# Patient Record
Sex: Female | Born: 1935 | Race: Black or African American | Hispanic: No | Marital: Married | Smoking: Former smoker
Health system: Southern US, Community
[De-identification: ages and names within clinical notes are randomized; demographics above are authoritative.]

## PROBLEM LIST (undated history)

## (undated) DIAGNOSIS — B853 Phthiriasis: Secondary | ICD-10-CM

## (undated) DIAGNOSIS — K859 Acute pancreatitis without necrosis or infection, unspecified: Secondary | ICD-10-CM

## (undated) DIAGNOSIS — M069 Rheumatoid arthritis, unspecified: Secondary | ICD-10-CM

## (undated) DIAGNOSIS — G629 Polyneuropathy, unspecified: Secondary | ICD-10-CM

## (undated) DIAGNOSIS — M8580 Other specified disorders of bone density and structure, unspecified site: Secondary | ICD-10-CM

## (undated) DIAGNOSIS — R634 Abnormal weight loss: Secondary | ICD-10-CM

## (undated) DIAGNOSIS — K219 Gastro-esophageal reflux disease without esophagitis: Secondary | ICD-10-CM

## (undated) DIAGNOSIS — N289 Disorder of kidney and ureter, unspecified: Secondary | ICD-10-CM

## (undated) DIAGNOSIS — J449 Chronic obstructive pulmonary disease, unspecified: Secondary | ICD-10-CM

## (undated) DIAGNOSIS — I10 Essential (primary) hypertension: Secondary | ICD-10-CM

## (undated) DIAGNOSIS — Z9981 Dependence on supplemental oxygen: Secondary | ICD-10-CM

## (undated) DIAGNOSIS — R42 Dizziness and giddiness: Secondary | ICD-10-CM

## (undated) DIAGNOSIS — N200 Calculus of kidney: Secondary | ICD-10-CM

## (undated) DIAGNOSIS — G4700 Insomnia, unspecified: Secondary | ICD-10-CM

## (undated) DIAGNOSIS — R1310 Dysphagia, unspecified: Secondary | ICD-10-CM

## (undated) DIAGNOSIS — L409 Psoriasis, unspecified: Secondary | ICD-10-CM

## (undated) DIAGNOSIS — J849 Interstitial pulmonary disease, unspecified: Secondary | ICD-10-CM

## (undated) DIAGNOSIS — M1990 Unspecified osteoarthritis, unspecified site: Secondary | ICD-10-CM

## (undated) HISTORY — DX: Phthiriasis: B85.3

## (undated) HISTORY — DX: Interstitial pulmonary disease, unspecified: J84.9

## (undated) HISTORY — DX: Rheumatoid arthritis, unspecified: M06.9

## (undated) HISTORY — DX: Dizziness and giddiness: R42

## (undated) HISTORY — DX: Essential (primary) hypertension: I10

## (undated) HISTORY — PX: CHOLECYSTECTOMY: SHX55

## (undated) HISTORY — DX: Other specified disorders of bone density and structure, unspecified site: M85.80

---

## 1960-08-01 HISTORY — PX: VESICOVAGINAL FISTULA CLOSURE W/ TAH: SUR271

## 1998-01-07 ENCOUNTER — Other Ambulatory Visit: Admission: RE | Admit: 1998-01-07 | Discharge: 1998-01-07 | Payer: Self-pay | Admitting: Family Medicine

## 1998-01-08 ENCOUNTER — Ambulatory Visit (HOSPITAL_COMMUNITY): Admission: RE | Admit: 1998-01-08 | Discharge: 1998-01-08 | Payer: Self-pay | Admitting: Family Medicine

## 1999-01-21 ENCOUNTER — Emergency Department (HOSPITAL_COMMUNITY): Admission: EM | Admit: 1999-01-21 | Discharge: 1999-01-21 | Payer: Self-pay | Admitting: Emergency Medicine

## 1999-06-17 ENCOUNTER — Encounter: Payer: Self-pay | Admitting: Emergency Medicine

## 1999-06-17 ENCOUNTER — Emergency Department (HOSPITAL_COMMUNITY): Admission: EM | Admit: 1999-06-17 | Discharge: 1999-06-17 | Payer: Self-pay | Admitting: Emergency Medicine

## 2001-12-02 ENCOUNTER — Encounter: Payer: Self-pay | Admitting: Emergency Medicine

## 2001-12-02 ENCOUNTER — Inpatient Hospital Stay (HOSPITAL_COMMUNITY): Admission: EM | Admit: 2001-12-02 | Discharge: 2001-12-04 | Payer: Self-pay | Admitting: Emergency Medicine

## 2002-05-08 ENCOUNTER — Ambulatory Visit: Admission: RE | Admit: 2002-05-08 | Discharge: 2002-05-08 | Payer: Self-pay | Admitting: Pulmonary Disease

## 2002-07-15 ENCOUNTER — Ambulatory Visit (HOSPITAL_COMMUNITY): Admission: RE | Admit: 2002-07-15 | Discharge: 2002-07-15 | Payer: Self-pay | Admitting: Family Medicine

## 2002-07-23 ENCOUNTER — Ambulatory Visit (HOSPITAL_COMMUNITY): Admission: RE | Admit: 2002-07-23 | Discharge: 2002-07-23 | Payer: Self-pay | Admitting: Family Medicine

## 2002-11-27 ENCOUNTER — Encounter: Admission: RE | Admit: 2002-11-27 | Discharge: 2003-01-08 | Payer: Self-pay | Admitting: Family Medicine

## 2003-04-17 ENCOUNTER — Ambulatory Visit (HOSPITAL_COMMUNITY): Admission: RE | Admit: 2003-04-17 | Discharge: 2003-04-17 | Payer: Self-pay | Admitting: Family Medicine

## 2003-04-17 ENCOUNTER — Encounter: Payer: Self-pay | Admitting: Family Medicine

## 2003-08-29 ENCOUNTER — Encounter: Admission: RE | Admit: 2003-08-29 | Discharge: 2003-08-29 | Payer: Self-pay | Admitting: Family Medicine

## 2004-04-16 ENCOUNTER — Inpatient Hospital Stay (HOSPITAL_COMMUNITY): Admission: EM | Admit: 2004-04-16 | Discharge: 2004-04-18 | Payer: Self-pay | Admitting: Emergency Medicine

## 2004-04-19 ENCOUNTER — Inpatient Hospital Stay (HOSPITAL_COMMUNITY): Admission: EM | Admit: 2004-04-19 | Discharge: 2004-04-22 | Payer: Self-pay | Admitting: Emergency Medicine

## 2004-04-22 ENCOUNTER — Inpatient Hospital Stay: Admission: RE | Admit: 2004-04-22 | Discharge: 2004-04-25 | Payer: Self-pay | Admitting: Emergency Medicine

## 2004-04-22 ENCOUNTER — Ambulatory Visit: Payer: Self-pay | Admitting: Physical Medicine & Rehabilitation

## 2004-05-12 ENCOUNTER — Ambulatory Visit: Payer: Self-pay | Admitting: *Deleted

## 2004-05-13 ENCOUNTER — Ambulatory Visit: Payer: Self-pay | Admitting: Family Medicine

## 2004-06-17 ENCOUNTER — Ambulatory Visit: Payer: Self-pay | Admitting: Pulmonary Disease

## 2004-08-25 ENCOUNTER — Ambulatory Visit: Payer: Self-pay | Admitting: Pulmonary Disease

## 2004-09-16 ENCOUNTER — Encounter: Admission: RE | Admit: 2004-09-16 | Discharge: 2004-09-16 | Payer: Self-pay | Admitting: Family Medicine

## 2004-09-22 ENCOUNTER — Ambulatory Visit: Payer: Self-pay | Admitting: Pulmonary Disease

## 2004-09-23 ENCOUNTER — Ambulatory Visit: Payer: Self-pay | Admitting: Family Medicine

## 2004-10-05 ENCOUNTER — Ambulatory Visit: Payer: Self-pay | Admitting: Internal Medicine

## 2004-10-11 ENCOUNTER — Encounter: Admission: RE | Admit: 2004-10-11 | Discharge: 2005-01-09 | Payer: Self-pay | Admitting: Family Medicine

## 2004-11-17 ENCOUNTER — Ambulatory Visit: Payer: Self-pay | Admitting: Internal Medicine

## 2004-12-20 ENCOUNTER — Ambulatory Visit: Payer: Self-pay | Admitting: Pulmonary Disease

## 2005-01-19 ENCOUNTER — Ambulatory Visit: Payer: Self-pay | Admitting: Internal Medicine

## 2005-02-14 ENCOUNTER — Ambulatory Visit: Payer: Self-pay | Admitting: Endocrinology

## 2005-02-15 ENCOUNTER — Ambulatory Visit (HOSPITAL_COMMUNITY): Admission: RE | Admit: 2005-02-15 | Discharge: 2005-02-15 | Payer: Self-pay | Admitting: Endocrinology

## 2005-02-16 ENCOUNTER — Ambulatory Visit: Payer: Self-pay | Admitting: Endocrinology

## 2005-02-16 ENCOUNTER — Ambulatory Visit: Payer: Self-pay | Admitting: Internal Medicine

## 2005-02-23 ENCOUNTER — Ambulatory Visit: Payer: Self-pay | Admitting: Pulmonary Disease

## 2005-03-01 ENCOUNTER — Ambulatory Visit: Payer: Self-pay | Admitting: Endocrinology

## 2005-04-18 ENCOUNTER — Ambulatory Visit: Admission: RE | Admit: 2005-04-18 | Discharge: 2005-04-18 | Payer: Self-pay | Admitting: Anesthesiology

## 2005-04-19 ENCOUNTER — Ambulatory Visit (HOSPITAL_COMMUNITY): Admission: RE | Admit: 2005-04-19 | Discharge: 2005-04-20 | Payer: Self-pay | Admitting: General Surgery

## 2005-04-19 ENCOUNTER — Ambulatory Visit: Payer: Self-pay | Admitting: Internal Medicine

## 2005-04-19 ENCOUNTER — Encounter (INDEPENDENT_AMBULATORY_CARE_PROVIDER_SITE_OTHER): Payer: Self-pay | Admitting: *Deleted

## 2005-05-11 ENCOUNTER — Ambulatory Visit: Payer: Self-pay | Admitting: Endocrinology

## 2005-05-16 ENCOUNTER — Ambulatory Visit: Payer: Self-pay | Admitting: Endocrinology

## 2005-05-18 ENCOUNTER — Ambulatory Visit: Payer: Self-pay | Admitting: Family Medicine

## 2005-05-23 ENCOUNTER — Emergency Department (HOSPITAL_COMMUNITY): Admission: EM | Admit: 2005-05-23 | Discharge: 2005-05-23 | Payer: Self-pay | Admitting: Emergency Medicine

## 2005-05-25 ENCOUNTER — Ambulatory Visit: Payer: Self-pay | Admitting: Endocrinology

## 2005-06-16 ENCOUNTER — Ambulatory Visit: Payer: Self-pay | Admitting: Endocrinology

## 2005-06-29 ENCOUNTER — Ambulatory Visit: Payer: Self-pay | Admitting: Family Medicine

## 2005-06-30 ENCOUNTER — Ambulatory Visit (HOSPITAL_COMMUNITY): Admission: RE | Admit: 2005-06-30 | Discharge: 2005-06-30 | Payer: Self-pay | Admitting: Internal Medicine

## 2005-07-06 ENCOUNTER — Ambulatory Visit: Payer: Self-pay | Admitting: Endocrinology

## 2005-07-11 ENCOUNTER — Ambulatory Visit: Payer: Self-pay | Admitting: Family Medicine

## 2005-07-28 ENCOUNTER — Ambulatory Visit: Payer: Self-pay | Admitting: Pulmonary Disease

## 2005-08-29 ENCOUNTER — Encounter: Admission: RE | Admit: 2005-08-29 | Discharge: 2005-08-29 | Payer: Self-pay | Admitting: Rheumatology

## 2005-09-06 ENCOUNTER — Ambulatory Visit: Payer: Self-pay | Admitting: Family Medicine

## 2005-09-15 ENCOUNTER — Ambulatory Visit: Payer: Self-pay | Admitting: Pulmonary Disease

## 2005-10-17 ENCOUNTER — Ambulatory Visit: Payer: Self-pay | Admitting: Pulmonary Disease

## 2005-10-18 ENCOUNTER — Ambulatory Visit: Payer: Self-pay | Admitting: Family Medicine

## 2005-10-26 ENCOUNTER — Ambulatory Visit (HOSPITAL_COMMUNITY): Admission: RE | Admit: 2005-10-26 | Discharge: 2005-10-26 | Payer: Self-pay | Admitting: Internal Medicine

## 2005-11-23 ENCOUNTER — Emergency Department (HOSPITAL_COMMUNITY): Admission: EM | Admit: 2005-11-23 | Discharge: 2005-11-23 | Payer: Self-pay | Admitting: Emergency Medicine

## 2005-11-30 ENCOUNTER — Ambulatory Visit: Payer: Self-pay | Admitting: Pulmonary Disease

## 2006-01-13 ENCOUNTER — Ambulatory Visit: Payer: Self-pay | Admitting: Family Medicine

## 2006-01-17 ENCOUNTER — Ambulatory Visit (HOSPITAL_COMMUNITY): Admission: RE | Admit: 2006-01-17 | Discharge: 2006-01-17 | Payer: Self-pay | Admitting: Family Medicine

## 2006-02-06 ENCOUNTER — Ambulatory Visit: Payer: Self-pay | Admitting: Pulmonary Disease

## 2006-02-28 ENCOUNTER — Ambulatory Visit: Payer: Self-pay | Admitting: Family Medicine

## 2006-03-25 ENCOUNTER — Encounter: Admission: RE | Admit: 2006-03-25 | Discharge: 2006-03-25 | Payer: Self-pay

## 2006-05-29 ENCOUNTER — Ambulatory Visit: Payer: Self-pay | Admitting: Family Medicine

## 2006-08-03 ENCOUNTER — Encounter: Admission: RE | Admit: 2006-08-03 | Discharge: 2006-08-03 | Payer: Self-pay

## 2006-09-07 ENCOUNTER — Encounter: Admission: RE | Admit: 2006-09-07 | Discharge: 2006-09-07 | Payer: Self-pay

## 2006-09-15 ENCOUNTER — Ambulatory Visit: Payer: Self-pay | Admitting: Pulmonary Disease

## 2006-09-21 ENCOUNTER — Ambulatory Visit: Payer: Self-pay | Admitting: Pulmonary Disease

## 2006-10-12 ENCOUNTER — Ambulatory Visit: Payer: Self-pay | Admitting: Family Medicine

## 2006-10-24 ENCOUNTER — Ambulatory Visit (HOSPITAL_COMMUNITY): Admission: RE | Admit: 2006-10-24 | Discharge: 2006-10-24 | Payer: Self-pay | Admitting: Family Medicine

## 2006-11-06 ENCOUNTER — Ambulatory Visit: Payer: Self-pay | Admitting: Pulmonary Disease

## 2006-12-05 ENCOUNTER — Ambulatory Visit (HOSPITAL_COMMUNITY): Admission: RE | Admit: 2006-12-05 | Discharge: 2006-12-05 | Payer: Self-pay | Admitting: Family Medicine

## 2007-01-02 ENCOUNTER — Encounter: Admission: RE | Admit: 2007-01-02 | Discharge: 2007-01-02 | Payer: Self-pay

## 2007-01-04 ENCOUNTER — Ambulatory Visit: Payer: Self-pay | Admitting: Internal Medicine

## 2007-02-26 DIAGNOSIS — I10 Essential (primary) hypertension: Secondary | ICD-10-CM | POA: Insufficient documentation

## 2007-02-26 DIAGNOSIS — M949 Disorder of cartilage, unspecified: Secondary | ICD-10-CM

## 2007-02-26 DIAGNOSIS — M899 Disorder of bone, unspecified: Secondary | ICD-10-CM | POA: Insufficient documentation

## 2007-03-13 ENCOUNTER — Ambulatory Visit: Payer: Self-pay | Admitting: Pulmonary Disease

## 2007-04-03 ENCOUNTER — Encounter: Admission: RE | Admit: 2007-04-03 | Discharge: 2007-04-30 | Payer: Self-pay

## 2007-05-01 ENCOUNTER — Encounter: Admission: RE | Admit: 2007-05-01 | Discharge: 2007-06-05 | Payer: Self-pay

## 2007-05-29 ENCOUNTER — Ambulatory Visit: Payer: Self-pay | Admitting: Pulmonary Disease

## 2007-06-11 ENCOUNTER — Ambulatory Visit: Payer: Self-pay | Admitting: Pulmonary Disease

## 2007-06-12 ENCOUNTER — Ambulatory Visit: Payer: Self-pay

## 2007-06-14 DIAGNOSIS — J8410 Pulmonary fibrosis, unspecified: Secondary | ICD-10-CM

## 2007-06-14 DIAGNOSIS — J209 Acute bronchitis, unspecified: Secondary | ICD-10-CM

## 2007-06-14 DIAGNOSIS — J9610 Chronic respiratory failure, unspecified whether with hypoxia or hypercapnia: Secondary | ICD-10-CM

## 2007-06-14 DIAGNOSIS — M0510 Rheumatoid lung disease with rheumatoid arthritis of unspecified site: Secondary | ICD-10-CM

## 2007-06-14 DIAGNOSIS — E678 Other specified hyperalimentation: Secondary | ICD-10-CM

## 2007-12-06 ENCOUNTER — Ambulatory Visit (HOSPITAL_COMMUNITY): Admission: RE | Admit: 2007-12-06 | Discharge: 2007-12-06 | Payer: Self-pay | Admitting: Internal Medicine

## 2008-01-16 ENCOUNTER — Ambulatory Visit: Payer: Self-pay | Admitting: Pulmonary Disease

## 2008-01-16 DIAGNOSIS — J449 Chronic obstructive pulmonary disease, unspecified: Secondary | ICD-10-CM

## 2008-04-25 ENCOUNTER — Ambulatory Visit: Payer: Self-pay | Admitting: Pulmonary Disease

## 2008-07-17 ENCOUNTER — Ambulatory Visit: Payer: Self-pay | Admitting: Pulmonary Disease

## 2008-07-17 LAB — CONVERTED CEMR LAB
BUN: 17 mg/dL (ref 6–23)
Chloride: 106 meq/L (ref 96–112)
Creatinine, Ser: 1.2 mg/dL (ref 0.4–1.2)
GFR calc Af Amer: 57 mL/min
GFR calc non Af Amer: 47 mL/min

## 2008-07-28 ENCOUNTER — Ambulatory Visit: Payer: Self-pay

## 2008-07-28 ENCOUNTER — Encounter: Payer: Self-pay | Admitting: Pulmonary Disease

## 2008-08-06 ENCOUNTER — Inpatient Hospital Stay (HOSPITAL_COMMUNITY): Admission: EM | Admit: 2008-08-06 | Discharge: 2008-08-12 | Payer: Self-pay | Admitting: Emergency Medicine

## 2008-08-06 ENCOUNTER — Ambulatory Visit: Payer: Self-pay | Admitting: Cardiology

## 2008-08-07 ENCOUNTER — Encounter (INDEPENDENT_AMBULATORY_CARE_PROVIDER_SITE_OTHER): Payer: Self-pay | Admitting: Internal Medicine

## 2008-08-08 ENCOUNTER — Telehealth (INDEPENDENT_AMBULATORY_CARE_PROVIDER_SITE_OTHER): Payer: Self-pay | Admitting: *Deleted

## 2008-08-26 ENCOUNTER — Encounter: Payer: Self-pay | Admitting: Pulmonary Disease

## 2008-10-09 ENCOUNTER — Ambulatory Visit: Payer: Self-pay | Admitting: Pulmonary Disease

## 2008-11-07 ENCOUNTER — Ambulatory Visit: Payer: Self-pay | Admitting: Family Medicine

## 2008-12-10 ENCOUNTER — Ambulatory Visit (HOSPITAL_COMMUNITY)
Admission: RE | Admit: 2008-12-10 | Discharge: 2008-12-10 | Payer: Self-pay | Admitting: Physical Medicine and Rehabilitation

## 2009-03-09 ENCOUNTER — Ambulatory Visit: Payer: Self-pay | Admitting: Pulmonary Disease

## 2009-03-12 ENCOUNTER — Ambulatory Visit (HOSPITAL_COMMUNITY): Admission: RE | Admit: 2009-03-12 | Discharge: 2009-03-12 | Payer: Self-pay | Admitting: Urology

## 2009-04-15 ENCOUNTER — Ambulatory Visit: Payer: Self-pay | Admitting: Internal Medicine

## 2009-04-27 ENCOUNTER — Ambulatory Visit: Payer: Self-pay | Admitting: Pulmonary Disease

## 2009-04-27 ENCOUNTER — Ambulatory Visit (HOSPITAL_COMMUNITY): Admission: RE | Admit: 2009-04-27 | Discharge: 2009-04-27 | Payer: Self-pay | Admitting: Family Medicine

## 2009-05-20 ENCOUNTER — Ambulatory Visit: Payer: Self-pay | Admitting: Pulmonary Disease

## 2009-09-17 ENCOUNTER — Ambulatory Visit: Payer: Self-pay | Admitting: Pulmonary Disease

## 2009-10-23 ENCOUNTER — Encounter: Payer: Self-pay | Admitting: Internal Medicine

## 2009-10-28 ENCOUNTER — Encounter: Admission: RE | Admit: 2009-10-28 | Discharge: 2009-10-28 | Payer: Self-pay | Admitting: Internal Medicine

## 2009-11-05 ENCOUNTER — Encounter: Admission: RE | Admit: 2009-11-05 | Discharge: 2009-11-05 | Payer: Self-pay | Admitting: Internal Medicine

## 2009-12-15 ENCOUNTER — Ambulatory Visit (HOSPITAL_COMMUNITY): Admission: RE | Admit: 2009-12-15 | Discharge: 2009-12-15 | Payer: Self-pay | Admitting: Internal Medicine

## 2010-03-04 ENCOUNTER — Ambulatory Visit (HOSPITAL_COMMUNITY): Admission: RE | Admit: 2010-03-04 | Discharge: 2010-03-04 | Payer: Self-pay | Admitting: Urology

## 2010-03-17 ENCOUNTER — Ambulatory Visit: Payer: Self-pay | Admitting: Pulmonary Disease

## 2010-05-26 ENCOUNTER — Encounter: Admission: RE | Admit: 2010-05-26 | Discharge: 2010-06-22 | Payer: Self-pay | Admitting: Rheumatology

## 2010-07-19 ENCOUNTER — Encounter: Admission: RE | Admit: 2010-07-19 | Payer: Self-pay | Source: Home / Self Care | Admitting: Rheumatology

## 2010-08-06 ENCOUNTER — Telehealth (INDEPENDENT_AMBULATORY_CARE_PROVIDER_SITE_OTHER): Payer: Self-pay | Admitting: *Deleted

## 2010-08-09 ENCOUNTER — Telehealth (INDEPENDENT_AMBULATORY_CARE_PROVIDER_SITE_OTHER): Payer: Self-pay | Admitting: *Deleted

## 2010-08-11 ENCOUNTER — Ambulatory Visit: Admit: 2010-08-11 | Payer: Self-pay | Admitting: Cardiology

## 2010-08-16 ENCOUNTER — Inpatient Hospital Stay (HOSPITAL_COMMUNITY)
Admission: EM | Admit: 2010-08-16 | Discharge: 2010-08-18 | Payer: Self-pay | Source: Home / Self Care | Attending: Internal Medicine | Admitting: Internal Medicine

## 2010-08-18 LAB — CBC
HCT: 30.7 % — ABNORMAL LOW (ref 36.0–46.0)
Hemoglobin: 9.5 g/dL — ABNORMAL LOW (ref 12.0–15.0)
MCH: 26 pg (ref 26.0–34.0)
MCHC: 30.9 g/dL (ref 30.0–36.0)
MCV: 83.9 fL (ref 78.0–100.0)
Platelets: 245 10*3/uL (ref 150–400)
RBC: 3.66 MIL/uL — ABNORMAL LOW (ref 3.87–5.11)
RDW: 14.2 % (ref 11.5–15.5)
WBC: 9.3 10*3/uL (ref 4.0–10.5)

## 2010-08-18 LAB — COMPREHENSIVE METABOLIC PANEL
ALT: <8 U/L (ref 0–35)
AST: 16 U/L (ref 0–37)
Albumin: 2.7 g/dL — ABNORMAL LOW (ref 3.5–5.2)
Alkaline Phosphatase: 41 U/L (ref 39–117)
BUN: 24 mg/dL — ABNORMAL HIGH (ref 6–23)
CO2: 25 mEq/L (ref 19–32)
Calcium: 8.9 mg/dL (ref 8.4–10.5)
Chloride: 112 mEq/L (ref 96–112)
Creatinine, Ser: 1.41 mg/dL — ABNORMAL HIGH (ref 0.4–1.2)
GFR calc Af Amer: 44 mL/min — ABNORMAL LOW (ref >60)
GFR calc non Af Amer: 37 mL/min — ABNORMAL LOW (ref >60)
Glucose, Bld: 123 mg/dL — ABNORMAL HIGH (ref 70–99)
Potassium: 3.9 mEq/L (ref 3.5–5.1)
Sodium: 143 mEq/L (ref 135–145)
Total Bilirubin: 0.3 mg/dL (ref 0.3–1.2)
Total Protein: 5.7 g/dL — ABNORMAL LOW (ref 6.0–8.3)

## 2010-08-18 LAB — DIFFERENTIAL
Basophils Absolute: 0 10*3/uL (ref 0.0–0.1)
Basophils Relative: 0 % (ref 0–1)
Eosinophils Absolute: 0.1 10*3/uL (ref 0.0–0.7)
Eosinophils Relative: 1 % (ref 0–5)
Lymphocytes Relative: 24 % (ref 12–46)
Lymphs Abs: 2.2 10*3/uL (ref 0.7–4.0)
Monocytes Absolute: 0.8 10*3/uL (ref 0.1–1.0)
Monocytes Relative: 9 % (ref 3–12)
Neutro Abs: 6.2 10*3/uL (ref 1.7–7.7)
Neutrophils Relative %: 66 % (ref 43–77)

## 2010-08-18 LAB — ABO/RH: ABO/RH(D): O POS

## 2010-08-18 LAB — APTT: aPTT: 28 seconds (ref 24–37)

## 2010-08-18 LAB — HEMOGLOBIN AND HEMATOCRIT, BLOOD
HCT: 26.9 % — ABNORMAL LOW (ref 36.0–46.0)
HCT: 30.2 % — ABNORMAL LOW (ref 36.0–46.0)
Hemoglobin: 8.3 g/dL — ABNORMAL LOW (ref 12.0–15.0)
Hemoglobin: 9.5 g/dL — ABNORMAL LOW (ref 12.0–15.0)

## 2010-08-18 LAB — PROTIME-INR
INR: 1 (ref 0.00–1.49)
Prothrombin Time: 13.4 seconds (ref 11.6–15.2)

## 2010-08-18 LAB — OCCULT BLOOD, POC DEVICE: Fecal Occult Bld: POSITIVE

## 2010-08-21 ENCOUNTER — Encounter: Payer: Self-pay | Admitting: Family Medicine

## 2010-08-21 NOTE — H&P (Addendum)
[name], [name]              ACCOUNT NO.:  [id number]  MEDICAL RECORD NO.:  [id number]          PATIENT TYPE:  INP  LOCATION:  1824                         FACILITY:  [facility]  PHYSICIAN:  [name], MD   DATE OF BIRTH:  [birth year]  DATE OF ADMISSION:  08/16/2010 DATE OF DISCHARGE:                             HISTORY & PHYSICAL   PRIMARY CARE PHYSICIAN:  [name], MD  GASTROENTEROLOGIST:  [name], MD  CHIEF COMPLAINT:  Rectal bleed.  HISTORY OF PRESENT ILLNESS:  This is a 76 year old woman who woke up this morning to use the bathroom and noticed bleeding per rectum.  She has had several episodes in the past 24 hours.  She described blood coming at the end of stool and at times fresh blood with no stool.  She denies any clots.  She has had some dizziness since this started.  She denies any nausea, vomiting coffee-ground emesis or hematemesis.  She has no abdominal pain.  She had a colonoscopy a week ago by Dr. Madilyn Fireman two polyps were said to be removed according to the patient.  In the emergency room, fecal occult blood was positive.  The patient denies any chest pain, shortness of breath, back pain.  No fever.  PAST MEDICAL HISTORY:  Includes: 1. History of rheumatoid arthritis of 22 years. 2. COPD. 3. High blood pressure. 4. Hyperlipidemia.  CURRENT MEDICATIONS: 1. Xanax 0.25 mg b.i.d. p.r.n. 2. Symbicort 160/4.5 one puff daily. 3. Prednisone 5 mg daily. 4. Os-Cal with D 500 plus 200 one tablet daily. 5. Omeprazole 40 mg q.p.m. 6. Hydrocodone/APAP 5/325 one to two tablets q.6. 7. Imuran injection every 2 weeks. 8. Lasix 20 mg daily. 9. Folic acid 1 mg daily. 10.Clobetasol cream topically b.i.d. 11.Bengay apply daily. 12.Aspirin 81 mg daily. 13.Amlodipine 5 mg daily.  ALLERGIES:  None.  SOCIAL HISTORY:  No smoking, alcohol, or drug use.  The patient lives at home.  FAMILY HISTORY:  One sister died of breast cancer and her twin  sister died of colon cancer.  Ten-point review of systems is negative except as described above.  PHYSICAL EXAMINATION:  VITAL SIGNS:  Blood pressure is 107/58, pulse 88, respirations 22, temperature is 98.2, O2 sat 97%. GENERAL:  The patient is comfortable in no distress.  She is mildly pale. HEENT:  Anicteric. NECK: Supple.  No JVD, adenopathy, or thyromegaly. LUNGS:  Clear to auscultation bilaterally. HEART:  S1 and S2.  No murmurs, rubs, or gallops. ABDOMEN:  Full, obese, soft, nontender.  Bowel sounds present.  No masses. EXTREMITIES:  No edema, clubbing, or cyanosis. NEUROLOGIC:  No focal deficits identified. SKIN:  No rash or lesion.  LABORATORY DATA:  Fecal occult blood is positive.  Hemoglobin is 9.5, white count 9.3, platelet count 245.  Coagulation profile is unremarkable.  Chemistry:  Sodium is 143, potassium 3.9, BUN 24, creatinine 1.41.  Liver enzymes normal.  Glucose 123.  Chest x-ray showed no acute cardiopulmonary process.  ASSESSMENT: 1. This is a 76 year old woman admitted with rectal bleeding, which is  likely a lower gastrointestinal bleed.  At this point, I  strongly suspect that her recent colonoscopy with polypectomy might     be related to her bleeding.  She currently is not on any blood     thinner except for a baby aspirin.  She also takes Bengay, but this     is topically.  I will at this point do serial hemoglobin, put her     on IV proton pump inhibitor, keep n.p.o. after 4 a.m., and GI     should be consulted in the morning.  Dr. Dorena Cookey should be     notified about this patient's admission. 2. Mild clinical dehydration:  The patient will get some IV fluids.     We are going to check hemoglobin serially, and if hemoglobin is     dropping, she might need some blood transfusion. 3. Rheumatoid arthritis:  Medication will be continued. 4. High blood pressure:  This is fairly stable at this time. 5. Chronic obstructive pulmonary disease:  This  is also compensated. 6. Deep venous thrombosis prophylaxis with sequential compression     device.  DISPOSITION:  Home in probably 2-3 days.  Fifty minutes spent.     [name], MD     FA/MEDQ  D:  08/17/2010  T:  08/17/2010  Job:  [id number]  Electronically Signed by [name]  on 08/21/2010 10:48:28 PM

## 2010-08-22 ENCOUNTER — Encounter: Payer: Self-pay | Admitting: Internal Medicine

## 2010-08-23 LAB — TYPE AND SCREEN
ABO/RH(D): O POS
Antibody Screen: NEGATIVE
Unit division: 0
Unit division: 0

## 2010-08-23 LAB — BASIC METABOLIC PANEL
CO2: 23 mEq/L (ref 19–32)
Calcium: 9 mg/dL (ref 8.4–10.5)
Creatinine, Ser: 1.1 mg/dL (ref 0.4–1.2)
Glucose, Bld: 94 mg/dL (ref 70–99)

## 2010-08-26 NOTE — Discharge Summary (Addendum)
  [name], WEIGEL              ACCOUNT NO.:  [id number]  MEDICAL RECORD NO.:  [id number]          PATIENT TYPE:  INP  LOCATION:  4732                         FACILITY:  [facility]  PHYSICIAN:  Hind I Elsaid, MD      DATE OF BIRTH:  Dec 26, 1934  DATE OF ADMISSION:  08/16/2010 DATE OF DISCHARGE:  08/18/2010                              DISCHARGE SUMMARY   PRIMARY CARE PHYSICIAN:  [name], MD  GASTROENTEROLOGIST:  [name], MD  DISCHARGE DIAGNOSES: 1. Lower gastrointestinal bleeding, felt to be status post     polypectomy. 2. Acute blood loss anemia, status post one unit of blood transfusion. 3. Acute renal failure, resolved. 4. Dehydration, resolved. 5. History of chronic obstructive pulmonary disease, on home oxygen. 6. History of rheumatoid arthritis.  MEDICATIONS: 1. Amlodipine 5 mg p.o. daily. 2. Aspirin 81 mg p.o. daily.  The patient will hold aspirin for 5     days. 3. Clobetasol solution, one application topically twice daily. 4. Folic acid 1 mg p.o. daily. 5. Furosemide 20 mg p.o. daily as needed. 6. Hydrocodone/APAP 1-2 tablets q.6 h. p.r.n. 7. Prednisone 5 mg p.o. daily. 8. Symbicort one inhaler twice daily. 9. Humira injection every 2 weeks. 10.Folic acid 1 mg daily. 11.Xanax 0.25 mg twice daily as needed.  FOLLOWUP:  The patient will follow up with her primary care physician within 1-2 weeks.  PROCEDURES:  Chest x-ray:  No active cardiopulmonary process noted.  CONSULTATION:  Gastroenterology consulted, done by Jonny Ruiz C. Madilyn Fireman, MD.  HISTORY OF PRESENT ILLNESS:  This is a 76 year old African American female presented with onset of painless hematochezia with orthostatic blood pressure and pulse changes yesterday morning and several passage of painless bloody bowel movements over the last 12 hours.  She had colonoscopy on August 09, 2010, with colonic polypectomy.  Other incidental finding were left-sided diverticulosis.  She has never had any  previous GI bleeding.  The patient was admitted to the hospital, managed conservatively with a blood transfusion and the patient's hemoglobin remained stable, hemoglobin today at 9.5.  The patient had regular bowel movement without any evidence of recurrent bleeding.  Bleeding felt to be secondary either to status post polypectomy, most likely diverticular bleeding.  The patient was advised to hold aspirin for another 1 week and follow up with her primary care physician.  Currently, we felt the patient is medically stable for discharge.     Hind Bosie Helper, MD     HIE/MEDQ  D:  08/18/2010  T:  08/19/2010  Job:  [id number]  cc:   [name], M.D. John C. Madilyn Fireman, M.D.  Electronically Signed by Ebony Cargo MD on 08/26/2010 12:45:13 PM

## 2010-08-31 NOTE — Letter (Signed)
Summary: CMN for Oxygen/[facility]  CMN for Oxygen/[facility]   Imported By: Sherian Rein 10/27/2009 15:13:58  _____________________________________________________________________  External Attachment:    Type:   Image     Comment:   External Document

## 2010-08-31 NOTE — Assessment & Plan Note (Signed)
Summary: rov for emphysema, ISLD due to RA   Primary Provider/Referring Provider:  Nicholos Johns  CC:  Pt is here for a 6 month f/u appt.  Pt denied any changes to breathing since last visit.  Pt denied a cough, sore throat, headache, and nasal congestion.  Pt is currently on 5mg  of Prednisone .Marland Kitchen  History of Present Illness: The pt comes in today for f/u of her chronic RF due to emphysema and PF felt to be secondary to her RA.  She has been doing about the same since the last visit.  Her exercise tolerance is at baseline, and she has had no acute exacerbation or recent pulmonary infection.  She is maintaining on her oxygen and bronchodilator regimen.  Current Medications (verified): 1)  Prednisone 5 Mg  Tabs (Prednisone) .... Take 1 Tablet By Mouth Once A Day 2)  Oscal 500/200 D-3 500-200 Mg-Unit  Tabs (Calcium-Vitamin D) .... Take By Mouth Once Daily 3)  Hydrocodone-Acetaminophen 5-500 Mg  Tabs (Hydrocodone-Acetaminophen) .... Take 1 Tab By Mouth Four Times A Day As Needed 4)  Humira .... Every 2 Weeks 5)  Cvs Folic Acid 800 Mcg Tabs (Folic Acid) .... Take 1 Tablet By Mouth Once A Day 6)  Cvs Natural Fish Oil 1000 Mg Caps (Omega-3 Fatty Acids) .... Take 1 Tablet By Mouth Once A Day 7)  Omeprazole 20 Mg  Cpdr (Omeprazole) .... By Mouth At Bedtime 8)  Lisinopril 20 Mg Tabs (Lisinopril) .... Take 1 Tablet By Mouth Once A Day 9)  Symbicort 160-4.5 Mcg/act Aero (Budesonide-Formoterol Fumarate) .... Inhale 2 Puffs Two Times A Day 10)  Aspirin Low Dose 81 Mg Tabs (Aspirin) .... Take 1 Tablet By Mouth Once A Day 11)  Proair Hfa 108 (90 Base) Mcg/act  Aers (Albuterol Sulfate) .... 2 Puffs Every 4-6 Hours As Needed 12)  Furosemide 20 Mg Tabs (Furosemide) .... Take As Needed  Allergies (verified): No Known Drug Allergies  Review of Systems       The patient complains of acid heartburn and indigestion.  The patient denies shortness of breath with activity, shortness of breath at rest, productive  cough, non-productive cough, coughing up blood, chest pain, irregular heartbeats, loss of appetite, weight change, abdominal pain, difficulty swallowing, sore throat, tooth/dental problems, headaches, nasal congestion/difficulty breathing through nose, sneezing, itching, ear ache, anxiety, depression, hand/feet swelling, joint stiffness or pain, rash, change in color of mucus, and fever.    Vital Signs:  Patient profile:   76 year old female Height:      69 inches Weight:      215.50 pounds BMI:     31.94 O2 Sat:      95 % on 2LPM pulsed Temp:     97.9 degrees F oral Pulse rate:   66 / minute BP sitting:   142 / 72  (left arm) Cuff size:   large  Vitals Entered By: Arman Filter LPN (March 17, 2010 1:36 PM)  O2 Flow:  2LPM pulsed CC: Pt is here for a 6 month f/u appt.  Pt denied any changes to breathing since last visit.  Pt denied a cough, sore throat, headache, nasal congestion.  Pt is currently on 5mg  of Prednisone . Comments Medications reviewed with patient Arman Filter LPN  March 17, 2010 1:36 PM    Physical Exam  General:  ow female in nad Lungs:  bibasilar crackles, no wheezing Heart:  rrr Extremities:  2+ edema bilat, no cyanosis  Neurologic:  alert and oriented, moves all  4.   Impression & Recommendations:  Problem # 1:  EMPHYSEMA (ICD-492.8)  the pt is maintaining a reasonable baseline on her current bronchodilator regimen.  I have asked her to continue her pulmonary meds, to work on weight reduction, and to try and get some type of physical exertion on a regular basis.  Problem # 2:  RHEUMATOID LUNG (ICD-714.81)  the pt is being treated with low dose prednisone and humira.  Her recent cxr shows no progression of her ISLD.  Other Orders: Est. Patient Level III (16109)  Patient Instructions: 1)  no change in meds 2)  continue to work on weight loss, try to stay active. 3)  followup with me in 6mos.

## 2010-08-31 NOTE — Assessment & Plan Note (Signed)
Summary: rov for emphysema, ISLD secondary to RA   Primary Provider/Referring Provider:  Nicholos Johns  CC:  Pt is here for a f/u appt.  Pt states breathing is unchanged from last visit.  pt denied a cough.  pt states she switched back to Furosemide.  Pt states the torsemide "was too strong."  Pt is currently on 5mg  of Prednisone.  Pt states she is only taking Symbicort prn and .  History of Present Illness: The pt comes in today for f/u of her known lung disease secondary to emphysema and PF related to RA.  At the last visit, she had gained 12 pounds of fluid in 3 weeks.  Her furosemide was changed to torsemide as a trial, and I asked her to f/u with her primary md for management of her volume overload.  She comes in today where she has gone back to furosemide..."torsemide was too strong".  She feels her breathing is near her usual baseline.  She is also taking her symbicort as needed rather than two times a day.  She denies any cough, congestion, or purulence.  She is maintaining on her oxygen.   Current Medications (verified): 1)  Prednisone 5 Mg  Tabs (Prednisone) .... Take 1 Tablet By Mouth Once A Day 2)  Oscal 500/200 D-3 500-200 Mg-Unit  Tabs (Calcium-Vitamin D) .... Take By Mouth Once Daily 3)  Hydrocodone-Acetaminophen 5-500 Mg  Tabs (Hydrocodone-Acetaminophen) .... Take 1 Tab By Mouth Four Times A Day As Needed 4)  Humira .... Every 2 Weeks 5)  Cvs Folic Acid 800 Mcg Tabs (Folic Acid) .... Take 1 Tablet By Mouth Once A Day 6)  Cvs Natural Fish Oil 1000 Mg Caps (Omega-3 Fatty Acids) .... Take 1 Tablet By Mouth Once A Day 7)  Omeprazole 20 Mg  Cpdr (Omeprazole) .... By Mouth At Bedtime 8)  Lisinopril 20 Mg Tabs (Lisinopril) .... Take 1 Tablet By Mouth Once A Day 9)  Symbicort 160-4.5 Mcg/act Aero (Budesonide-Formoterol Fumarate) .... Inhale 2 Puffs Two Times A Day 10)  Aspirin Low Dose 81 Mg Tabs (Aspirin) .... Take 1 Tablet By Mouth Once A Day 11)  Cyclobenzaprine Hcl 5 Mg Tabs  (Cyclobenzaprine Hcl) .... Take 1 To 2 Tabs By Mouth At Bedtime 12)  Proair Hfa 108 (90 Base) Mcg/act  Aers (Albuterol Sulfate) .... 2 Puffs Every 4-6 Hours As Needed 13)  Furosemide 20 Mg Tabs (Furosemide) .... Take As Needed  Allergies (verified): No Known Drug Allergies  Review of Systems      See HPI  Vital Signs:  Patient profile:   76 year old female Height:      69 inches Weight:      223.50 pounds BMI:     33.12 O2 Sat:      93 % on 2 LPM pulsed Temp:     97.2 degrees F oral Pulse rate:   81 / minute BP sitting:   140 / 80  (left arm) Cuff size:   regular  Vitals Entered By: Arman Filter LPN (September 17, 2009 1:39 PM)  O2 Flow:  2 LPM pulsed CC: Pt is here for a f/u appt.  Pt states breathing is unchanged from last visit.  pt denied a cough.  pt states she switched back to Furosemide.  Pt states the torsemide "was too strong."  Pt is currently on 5mg  of Prednisone.  Pt states she is only taking Symbicort prn,  Comments Medications reviewed with patient Arman Filter LPN  September 17, 2009 1:39 PM  Physical Exam  General:  obese female in nad Nose:  no purulence noted. Lungs:  crackles both bases, no wheezing Heart:  rrr Extremities:  3+ edema LLE, 1+ edema RLL Neurologic:  alert and oriented, moves all 4.   Impression & Recommendations:  Problem # 1:  EMPHYSEMA (ICD-492.8) the pt is at her usual baseline from this standpoint.  I have asked her to take her symbicort daily rather than as needed, and to stay on her oxygen therapy.  Problem # 2:  RHEUMATOID LUNG (ICD-714.81) no worsening by exam.  She is being treated with humira and prednisone  Patient Instructions: 1)  stay on symbicort am and pm regularly 2)  stay as active as possible. 3)  you need to f/u with your primary md about your fluid issues. 4)  f/u 4-53mos  Appended Document: Orders Update    Clinical Lists Changes  Orders: Added new Service order of Est. Patient Level III (16109) -  Signed

## 2010-09-02 NOTE — Progress Notes (Signed)
Summary: problems with O2  Phone Note Call from Patient   Caller: Patient Call For: Dr. Shelle Iron Summary of Call: Patient phoned she is having problems with her O2 concentrator. The water is coming through the tube and into her nose for the past three days. She has spoken to [facility] and they have told her for the past three days that someone would be out but they still have not shown up. Pt can be reached at [phone] Initial call taken by: Vedia Coffer,  August 06, 2010 9:19 AM  Follow-up for Phone Call        Spoke with Faith at [facility].  Pt is on schedule for today between 2 and 5 for delivery of new equipment.  Pt informed of time and told to call her our office if any other problems. Abigail Miyamoto RN  August 06, 2010 10:00 AM

## 2010-09-02 NOTE — Progress Notes (Signed)
Summary: waiting on O2 delivery-AWAIT FOR [place] [facility]  Phone Note Call from Patient Call back at Home Phone [phone]   Caller: Patient Call For: clance Summary of Call: pt states that [facility] not yet [facility] O2. Initial call taken by: Tivis Ringer, CNA,  August 09, 2010 9:13 AM  Follow-up for Phone Call        Spoke with pt.  She is very upset b/c nobody from [facility] has came out to look at her o2 concentrator yet.  This was supposed to have been done on 08/06/10- see phone note dated 08/06/10.  I called and spoke with [place]. She states will check on this and find out when someone will be going out there and call me back. Vernie Murders  August 09, 2010 10:14 AM   Additional Follow-up for Phone Call Additional follow up Details #1::        Mayra Reel called back and stated that she is unsure what happened on 1/6,but someone will come out to the pt's home today, and they will be contacting her to set up a time where they know she will be home.  I called the pt and advised her of this. Additional Follow-up by: Vernie Murders,  August 09, 2010 10:51 AM

## 2010-09-13 ENCOUNTER — Ambulatory Visit (INDEPENDENT_AMBULATORY_CARE_PROVIDER_SITE_OTHER): Payer: Medicare Other | Admitting: Pulmonary Disease

## 2010-09-13 ENCOUNTER — Encounter: Payer: Self-pay | Admitting: Pulmonary Disease

## 2010-09-13 DIAGNOSIS — J438 Other emphysema: Secondary | ICD-10-CM

## 2010-09-28 NOTE — Assessment & Plan Note (Signed)
Summary: rov for ISLD, COPD   Primary Provider/Referring Provider:  Nicholos Johns  CC:  6 month f/u appt.  C/o non productive cough and tightness in chest.  Denies increased sob.  States she was in hosp in Jan 2012 d/t problems from colonoscopy.  Marland Kitchen  History of Present Illness: the pt comes in today for f/u of her known chronic respiratory failure due to ISLD from RA and mild COPD.  She is not far from usual baseline, but does have a mild dry cough.  She is staying on her oxygen and pulmonary meds, and is not having any worsening of LE edema.   Current Medications (verified): 1)  Prednisone 5 Mg  Tabs (Prednisone) .... Take 1 Tablet By Mouth Once A Day 2)  Oscal 500/200 D-3 500-200 Mg-Unit  Tabs (Calcium-Vitamin D) .... Take By Mouth Once Daily 3)  Hydrocodone-Acetaminophen 5-500 Mg  Tabs (Hydrocodone-Acetaminophen) .... Take 1 Tab By Mouth Four Times A Day As Needed 4)  Humira .... Every 2 Weeks 5)  Cvs Folic Acid 800 Mcg Tabs (Folic Acid) .... Take 1 Tablet By Mouth Once A Day 6)  Cvs Natural Fish Oil 1000 Mg Caps (Omega-3 Fatty Acids) .... Take 1 Tablet By Mouth Once A Day 7)  Omeprazole 20 Mg  Cpdr (Omeprazole) .... By Mouth At Bedtime 8)  Amlodipine Besylate 5 Mg Tabs (Amlodipine Besylate) .... Take 1 Tablet By Mouth Once A Day 9)  Symbicort 160-4.5 Mcg/act Aero (Budesonide-Formoterol Fumarate) .... Inhale 2 Puffs Two Times A Day 10)  Aspirin Low Dose 81 Mg Tabs (Aspirin) .... Take 1 Tablet By Mouth Once A Day 11)  Proair Hfa 108 (90 Base) Mcg/act  Aers (Albuterol Sulfate) .... 2 Puffs Every 4-6 Hours As Needed 12)  Furosemide 20 Mg Tabs (Furosemide) .... Take As Needed 13)  Cyclobenzaprine Hcl 5 Mg Tabs (Cyclobenzaprine Hcl) .... Take 1 Tablet By Mouth Once A Day  Allergies (verified): No Known Drug Allergies  Past History:  Past Medical History: ISLD presumed due to RA--chronic O2 dep RF Osteopenia rheumatoid arthritis emphysema--FEV1% 65 (mild)  2008. 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  The patient denies shortness of breath with activity, shortness of breath at rest, productive cough, coughing up blood, chest pain, acid heartburn, indigestion, loss of appetite, weight change, abdominal pain, difficulty swallowing, sore throat, tooth/dental problems, headaches, nasal congestion/difficulty breathing through nose, sneezing, ear ache, depression, rash, change in color of mucus, and fever.    Vital Signs:  Patient profile:   76 year old female Height:      69 inches Weight:      212.25 pounds O2 Sat:      94 % on 2 LPM pulsed Temp:     98.1 degrees F oral Pulse rate:   72 / minute BP sitting:   154 / 68  (left arm) Cuff size:   large  Vitals Entered By: Arman Filter LPN (September 13, 2010 1:42 PM)  O2 Flow:  2 LPM pulsed CC: 6 month f/u appt.  C/o non productive cough and tightness in chest.  Denies increased sob.  States she was in hosp in Jan 2012 d/t problems from colonoscopy.   Comments Medications reviewed with patient Arman Filter LPN  September 13, 2010 1:43 PM    Physical Exam  General:  ow female in nad Lungs:  basilar crackles, no  wheezing or rhonchi Heart:  rrr Extremities:  1+ edema bilat, no cyanosis  Neurologic:  alert and oriented,moves all 4.   Impression & Recommendations:  Problem # 1:  EMPHYSEMA (ICD-492.8) the pt has mild obstructive disease from 2008, and is maintaining on her bronchodilator regimen.  Problem # 2:  PULMONARY FIBROSIS (ICD-515) Probably due to her known RA.  She will need re-assessment soon with cxr/pfts.  She also needs to f/u closely with rheumatology.   Medications Added to Medication List This Visit: 1)  Amlodipine Besylate 5 Mg Tabs (Amlodipine besylate) .... Take 1 tablet by mouth once a day 2)   Cyclobenzaprine Hcl 5 Mg Tabs (Cyclobenzaprine hcl) .... Take 1 tablet by mouth once a day  Other Orders: Est. Patient Level III (16109) DME Referral (DME) Rheumatology Referral (Rheumatology)  Patient Instructions: 1)  no change in meds 2)  try to stay as active as possible 3)  will try to get you referred back to rheumatology 4)  followup with me in 6mos  Prescriptions: PROAIR HFA 108 (90 BASE) MCG/ACT  AERS (ALBUTEROL SULFATE) 2 puffs every 4-6 hours as needed  #1 x 6   Entered and Authorized by:   Barbaraann Share MD   Signed by:   Barbaraann Share MD on 09/13/2010   Method used:   Print then Give to Patient   RxID:   [id number]

## 2010-10-07 NOTE — Consult Note (Signed)
  NAMECATERINE, MCMEANS              ACCOUNT NO.:  [id number]  MEDICAL RECORD NO.:  [id number]          PATIENT TYPE:  INP  LOCATION:  4732                         FACILITY:  [facility]  PHYSICIAN:  Clotilda Hafer C. Madilyn Fireman, M.D.    DATE OF BIRTH:  Jul 05, 1935  DATE OF CONSULTATION:  08/17/2010 DATE OF DISCHARGE:                                CONSULTATION   REASON FOR CONSULTATION:  Lower GI bleed.  HISTORY OF PRESENT ILLNESS:  The patient is a 76 year old black female who presented with onset of painless hematochezia with orthostatic blood pressure and pulse changes yesterday morning with several passages of painless bloody bowel movements over the next 12 hours.  She states she has not had any bowel movement in the last 16 hours.  She had a colonoscopy done on January 9th with transverse polypectomy x3.  Other incidental findings were left-sided diverticulosis.  She has never had any previous GI bleeding.  She had been on a baby aspirin a day and we told her to hold it for a week, but she started back after 3 days.  She had a hemoglobin in August 13.  On admission last night, it was 9.5 and this morning it is 8.3.  She is currently receiving the first of 2 units of packed red blood cells ordered.  PAST MEDICAL HISTORY:  COPD, rheumatoid arthritis, hypertension, and hyperlipidemia.  MEDICATIONS:  Xanax, Symbicort, prednisone, omeprazole, Lasix, and amlodipine.  SOCIAL HISTORY:  Denies alcohol or tobacco use.  FAMILY HISTORY:  Twin sister, non identical, did die of colon cancer.  PHYSICAL EXAMINATION:  GENERAL:  Well-developed, well-nourished black female in no acute distress. HEART:  Regular rate and rhythm without murmur. LUNGS:  Clear. ABDOMEN:  Soft, nondistended with normoactive bowel sounds.  No hepatosplenomegaly, mass, or guarding.  LABORATORY DATA:  As outlined above.  Also BUN 24, creatinine 1.41.  IMPRESSION:  Lower gastrointestinal bleed, suspect post polypectomy bleed  versus diverticular bleed.  Currently appears to stabilize.  PLAN:  Monitor stools and hemoglobin, decide over the next 24 hours whether to pursue colonoscopy or expectant management.  I will follow with you.  Appreciate hospitalist admitting this patient.          ______________________________ [name], M.D.     JCH/MEDQ  D:  08/17/2010  T:  08/17/2010  Job:  [id number]  Electronically Signed by Dorena Cookey M.D. on 10/05/2010 07:32:29 PM

## 2010-10-15 LAB — HEPATIC FUNCTION PANEL
AST: 17 U/L (ref 0–37)
Bilirubin, Direct: 0.1 mg/dL (ref 0.0–0.3)
Indirect Bilirubin: 0.6 mg/dL (ref 0.3–0.9)

## 2010-10-15 LAB — GLUCOSE, CAPILLARY: Glucose-Capillary: 103 mg/dL — ABNORMAL HIGH (ref 70–99)

## 2010-10-15 LAB — BASIC METABOLIC PANEL
BUN: 19 mg/dL (ref 6–23)
Chloride: 109 mEq/L (ref 96–112)
Creatinine, Ser: 1.26 mg/dL — ABNORMAL HIGH (ref 0.4–1.2)
Glucose, Bld: 102 mg/dL — ABNORMAL HIGH (ref 70–99)

## 2010-10-15 LAB — CBC
MCHC: 32.8 g/dL (ref 30.0–36.0)
MCV: 82.3 fL (ref 78.0–100.0)
Platelets: 238 10*3/uL (ref 150–400)
RDW: 15.1 % (ref 11.5–15.5)
WBC: 7.3 10*3/uL (ref 4.0–10.5)

## 2010-10-15 LAB — SURGICAL PCR SCREEN
MRSA, PCR: NEGATIVE
Staphylococcus aureus: NEGATIVE

## 2010-11-06 LAB — BASIC METABOLIC PANEL
BUN: 22 mg/dL (ref 6–23)
CO2: 32 mEq/L (ref 19–32)
Calcium: 9.7 mg/dL (ref 8.4–10.5)
Creatinine, Ser: 1.19 mg/dL (ref 0.4–1.2)
GFR calc non Af Amer: 45 mL/min — ABNORMAL LOW (ref >60)
Glucose, Bld: 99 mg/dL (ref 70–99)
Sodium: 142 mEq/L (ref 135–145)

## 2010-11-15 LAB — CBC
HCT: 34.8 % — ABNORMAL LOW (ref 36.0–46.0)
HCT: 35 % — ABNORMAL LOW (ref 36.0–46.0)
HCT: 37.8 % (ref 36.0–46.0)
HCT: 37.9 % (ref 36.0–46.0)
Hemoglobin: 11 g/dL — ABNORMAL LOW (ref 12.0–15.0)
Hemoglobin: 11.2 g/dL — ABNORMAL LOW (ref 12.0–15.0)
Hemoglobin: 11.8 g/dL — ABNORMAL LOW (ref 12.0–15.0)
Hemoglobin: 12 g/dL (ref 12.0–15.0)
MCHC: 31.6 g/dL (ref 30.0–36.0)
MCV: 78.9 fL (ref 78.0–100.0)
MCV: 79.9 fL (ref 78.0–100.0)
Platelets: 253 10*3/uL (ref 150–400)
Platelets: 275 10*3/uL (ref 150–400)
Platelets: 311 10*3/uL (ref 150–400)
RDW: 16.4 % — ABNORMAL HIGH (ref 11.5–15.5)
RDW: 16.4 % — ABNORMAL HIGH (ref 11.5–15.5)
WBC: 10.9 10*3/uL — ABNORMAL HIGH (ref 4.0–10.5)
WBC: 12.8 10*3/uL — ABNORMAL HIGH (ref 4.0–10.5)
WBC: 16.9 10*3/uL — ABNORMAL HIGH (ref 4.0–10.5)

## 2010-11-15 LAB — BASIC METABOLIC PANEL
BUN: 28 mg/dL — ABNORMAL HIGH (ref 6–23)
BUN: 32 mg/dL — ABNORMAL HIGH (ref 6–23)
Calcium: 8.1 mg/dL — ABNORMAL LOW (ref 8.4–10.5)
Calcium: 9.2 mg/dL (ref 8.4–10.5)
Chloride: 101 mEq/L (ref 96–112)
GFR calc Af Amer: 39 mL/min — ABNORMAL LOW (ref >60)
GFR calc non Af Amer: 27 mL/min — ABNORMAL LOW (ref >60)
GFR calc non Af Amer: 32 mL/min — ABNORMAL LOW (ref >60)
GFR calc non Af Amer: 33 mL/min — ABNORMAL LOW (ref >60)
GFR calc non Af Amer: 37 mL/min — ABNORMAL LOW (ref >60)
Glucose, Bld: 140 mg/dL — ABNORMAL HIGH (ref 70–99)
Glucose, Bld: 158 mg/dL — ABNORMAL HIGH (ref 70–99)
Potassium: 3.4 mEq/L — ABNORMAL LOW (ref 3.5–5.1)
Potassium: 3.9 mEq/L (ref 3.5–5.1)
Potassium: 4 mEq/L (ref 3.5–5.1)
Potassium: 4.1 mEq/L (ref 3.5–5.1)
Sodium: 139 mEq/L (ref 135–145)
Sodium: 139 mEq/L (ref 135–145)
Sodium: 140 mEq/L (ref 135–145)
Sodium: 141 mEq/L (ref 135–145)
Sodium: 141 mEq/L (ref 135–145)

## 2010-11-15 LAB — POCT CARDIAC MARKERS
CKMB, poc: 3 ng/mL (ref 1.0–8.0)
Myoglobin, poc: 115 ng/mL (ref 12–200)
Myoglobin, poc: 99 ng/mL (ref 12–200)

## 2010-11-15 LAB — DIFFERENTIAL
Basophils Absolute: 0 10*3/uL (ref 0.0–0.1)
Basophils Relative: 1 % (ref 0–1)
Lymphocytes Relative: 25 % (ref 12–46)
Monocytes Absolute: 0.7 10*3/uL (ref 0.1–1.0)
Monocytes Relative: 8 % (ref 3–12)
Neutro Abs: 5.3 10*3/uL (ref 1.7–7.7)
Neutrophils Relative %: 64 % (ref 43–77)

## 2010-11-15 LAB — URINE MICROSCOPIC-ADD ON

## 2010-11-15 LAB — URINALYSIS, ROUTINE W REFLEX MICROSCOPIC
Glucose, UA: NEGATIVE mg/dL
Glucose, UA: NEGATIVE mg/dL
Leukocytes, UA: NEGATIVE
Leukocytes, UA: NEGATIVE
Specific Gravity, Urine: 1.014 (ref 1.005–1.030)
pH: 6 (ref 5.0–8.0)
pH: 6.5 (ref 5.0–8.0)

## 2010-11-15 LAB — CARDIAC PANEL(CRET KIN+CKTOT+MB+TROPI)
Relative Index: 1.8 (ref 0.0–2.5)
Total CK: 233 U/L — ABNORMAL HIGH (ref 7–177)
Troponin I: 0.01 ng/mL (ref 0.00–0.06)

## 2010-11-15 LAB — COMPREHENSIVE METABOLIC PANEL
Albumin: 3.1 g/dL — ABNORMAL LOW (ref 3.5–5.2)
Alkaline Phosphatase: 55 U/L (ref 39–117)
BUN: 10 mg/dL (ref 6–23)
Creatinine, Ser: 1.26 mg/dL — ABNORMAL HIGH (ref 0.4–1.2)
Glucose, Bld: 102 mg/dL — ABNORMAL HIGH (ref 70–99)
Total Bilirubin: 0.8 mg/dL (ref 0.3–1.2)
Total Protein: 6.6 g/dL (ref 6.0–8.3)

## 2010-11-15 LAB — LIPID PANEL
Cholesterol: 183 mg/dL (ref 0–200)
Total CHOL/HDL Ratio: 3.7 RATIO

## 2010-11-15 LAB — CK TOTAL AND CKMB (NOT AT ARMC): Total CK: 286 U/L — ABNORMAL HIGH (ref 7–177)

## 2010-11-15 LAB — PROTIME-INR: INR: 0.9 (ref 0.00–1.49)

## 2010-11-15 LAB — HOMOCYSTEINE: Homocysteine: 23.1 umol/L — ABNORMAL HIGH (ref 4.0–15.4)

## 2010-12-14 NOTE — Op Note (Signed)
NAMEMARGALIT, [name]              ACCOUNT NO.:  [id number]   MEDICAL RECORD NO.:  [id number]          PATIENT TYPE:  AMB   LOCATION:  DAY                          FACILITY:  [facility]   PHYSICIAN:  Excell Seltzer. Annabell Howells, M.D.    DATE OF BIRTH:  1935-01-01   DATE OF PROCEDURE:  03/12/2009  DATE OF DISCHARGE:                               OPERATIVE REPORT   PROCEDURE:  Cystoscopy, bilateral retrograde pyelograms with  interpretation.   PREOPERATIVE DIAGNOSIS:  Hematuria with possible right ureteral stone.   POSTOPERATIVE DIAGNOSIS:  Hematuria.  No stone found.   ANESTHESIA:  General.   SPECIMENS:  None.   COMPLICATIONS:  None.   INDICATIONS:  [name] is a 76 year old white female with  intermittent gross hematuria, who had a CT scan done recently as part of  her evaluation; it revealed bilateral renal stones.  There was a  question of a small ureteral stone.  She refused IV contrast so she is  to undergo cystography retrograde, with possible ureteroscopy.   FINDINGS OF PROCEDURE:  The patient was taken operating room where  general anesthetic was induced.  She had been given Cipro and fitted  with PAS hose.  She was placed in lithotomy position.  Her perineum and  genitalia were prepped with Betadine solution.  She was draped in the  usual sterile fashion.   Cystoscopy was performed using a 22-French scope and 12-degree lens.  Examination revealed a normal urethra.  The bladder wall had minimal  trabeculation.  No tumors or stones were noted.  There was a small  submucosal benign appearing nodule in the right trigone, that measured  about 3 mm; it had been noted on office cystoscopy.  Ureteral orifices  were otherwise unremarkable.   The right ureteral orifice was cannulated with a 5-French open-end  catheter and contrast was instilled.  Right retrograde pyelogram  revealed normal ureter and internal collecting system.   The left ureteral orifice was cannulated with a 5-French  open-end  catheter and contrast was instilled.  The left retrograde pyelogram demonstrated normal ureter and internal  collecting system without filling defects.   At this point, the bladder was drained and the cystoscope was removed.  The patient was taken down from lithotomy position.  Her anesthetic was  reversed.  She was moved to the recovery room in stable condition.   She has follow-up on March 23, 2009 at 1:00 p.m.  No postoperative  medications will be given.      Excell Seltzer. Annabell Howells, M.D.  Electronically Signed     JJW/MEDQ  D:  03/12/2009  T:  03/12/2009  Job:  [id number]

## 2010-12-14 NOTE — H&P (Signed)
[name], [name]              ACCOUNT NO.:  [id number]   MEDICAL RECORD NO.:  [id number]          PATIENT TYPE:  EMS   LOCATION:  ED                           [facility]AC[facility]  [facility]   PHYSICIAN:  Herbie Saxon, MDDATE OF BIRTH:  [birth year]   DATE OF ADMISSION:  08/06/2008  DATE OF DISCHARGE:                              HISTORY & PHYSICAL   PRIMARY CARE PHYSICIAN:  [name], M.D.   She has no healthcare power of attorney assigned.   CODE STATUS:  She is a do not resuscitate.   CARDIOLOGIST:  [employer].   PRESENTING COMPLAINT:  Shortness of breath, bilateral leg swelling of 2  weeks' duration.   HISTORY OF PRESENTING COMPLAINT:  This is a 76 year old African-American  female who has noticed increasing dyspnea on exertion since Christmas  with associated increasing bilateral leg swelling.  The patient has also  noticed orthopnea, paroxysmal nocturnal dyspnea and palpitations, mild  dizziness.  No syncopal episodes.  No cough or hemoptysis.  The patient  has had no symptoms referable to the genitourinary or gastrointestinal  systems.  She developed an itchy rash on both arms and legs about 3 days  ago, worse __________ and the rash has been reducing in size, and the  itching is also much less.  The patient denies any headache or fever.  She also uses home oxygen for COPD, but she says she has not been  compliant with her diuretic therapy in the last 1 week.  She has a  remote history of tobacco abuse.  She smoked about half a pack per day  for more than 30 years, and she quit 10 years ago.   PAST MEDICAL HISTORY:  1. Rheumatoid arthritis for 20 years.  2. Chronic obstructive pulmonary disease for 2-1/2 years.  3. Hypertension.  4. Hyperlipidemia.   PAST SURGICAL HISTORY:  Noncontributory.   FAMILY HISTORY:  Sister had a myocardial infarction.  Another sister had  cancer, query the organ.   SOCIAL HISTORY:  The patient has 1 child.  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  Currently, she is retired.  She used to drink  about 1 or 2 drinks or liquor twice a week.  She quit about 3 years ago.  She smoked cigarettes, a half pack a day for more than 30 years, but  quit about 10 years ago.   REVIEW OF SYSTEMS:  A 14-point review of systems was normal with  pertinent positive findings in the history of presenting complaint.   ALLERGIES:  No known drug allergies.   MEDICATIONS:  (Doses not given.)  1. Amlodipine.  2. Fortical  3. Calcium.  4. Prevacid.  5. Fish oil.  6. Flexeril.  7. Pravastatin.  8. Home oxygen.   PHYSICAL EXAMINATION:  GENERAL:  She is an elderly lady not in acute  respiratory distress.  VITAL SIGNS:  Temperature is 98, pulse is 106, respiratory rate is 22,  blood pressure 132/84.  HEENT:  Mildly pale.  Not jaundiced.  Pupils equal, round and reactive  to light and accommodation.  Extraocular muscles  are intact.  Oropharynx  and pharynx are clear.  NECK:  Supple.  CHEST:  No chest wall deformity or tenderness.  She has bibasilar rales  and inspiration rhonchi.  HEART:  Heart sounds 1, 2 and 3, regular tachycardia.  ABDOMEN:  She has truncal obesity.  Soft, nontender.  Bowel sounds  present.  No organomegaly.  NEUROLOGIC:  She is alert and oriented to time, place and person.  CNS:  No gross neurologic deficits.  EXTREMITIES:  Peripheral pulses reduced, 2+ pedal edema.   LABORATORY DATA:  WBC of 8, hematocrit 34, platelet count of 253.  Troponin less than 0.05.  EKG revealed normal sinus rhythm at 93 per  minute with prolonged QT interval.  Chest x-ray shows cardiomegaly, no  pulmonary edema.  Urinalysis negative.  Current BNP is 165.  Chemistry -  sodium is 144, glucose 102, potassium 3.3, chloride 108, bicarbonate 29,  BUN 10, creatinine 1.0.   ASSESSMENT:  1. Acute-on-chronic respiratory distress.  2. Congestive heart failure decompensation.  3. Chronic obstructive pulmonary disease  exacerbation.  4. Hypokalemia.  5. Anemia of chronic disease.  6. Poor medication compliance.  7. Morbid obesity.  8. Hyperlipidemia.  9. Prolonged QT interval.  10.Severe systolic hypertension.  11.Sinus tachycardia.  12.History of rheumatoid arthritis.  13.Remote history of tobacco abuse.   PLAN:  The patient will be admitted to a telemetry bed, bed rest,  nursing- bed at 45 degrees inclination, strict input and output and  daily weighing, oxygen 2-5 liters supplement in order to keep  saturations greater than 90.  Diet will be heart healthy, low  cholesterol.  KVO.  Will check a PT, PTT and INR, cardiac enzymes and  EKG q.8h. x3.  Also obtain a thyroid function test, fasting lipids,  homocystine level and Hemoccult x2.  Get an ABG.  Also get a 2-D  echocardiogram if none within the last 6 months and pursue a cardiology  evaluation after reviewing the echocardiogram.  Lovenox 40 mg  subcutaneous daily for DVT prophylaxis, Protonix  40 mg IV daily, Zofran  4 mg IV q.8h. p.r.n. for nausea, albuterol and Atrovent 1 unit q.6h.  standing, 1 unit q.3h. p.r.n., Tylenol 650 mg q.4-6h. p.r.n. for mild  pain.  Will start on Lasix 40 mg IV q.12h.  Replete her potassium  with  K-Dur 40 mEq.  Start on K-Dur 20 mEq daily, Solu-Medrol 60 mg IV q.12h.  Continue the amlodipine 10 mg daily.  Start on lisinopril HCTZ 5/12.5 mg  daily, will verify home medications doses.  Lopressor 2.5 mg IV q.6h.  p.r.n. for blood pressure greater than 160/110 or irregular heart rate  of 110 per minute.  Check a BMP, BNP, chest x-ray and ABG in the  morning.  Counsel on the need for improved medication compliance and  education on congestive heart failure will be given.  Also dietary  consult and low salt  diet.   H and P time one hour.  The patient's illness, medication and treatment  were explained to her and her husband who verbalized understanding.   CONDITION:  Fair.   PROGNOSIS:  Guarded.       Herbie Saxon, MD  Electronically Signed     MIO/MEDQ  D:  08/06/2008  T:  08/06/2008  Job:  [id number]   cc:   [name], M.D.  Fax: [phone]   [facility]

## 2010-12-14 NOTE — Discharge Summary (Signed)
[name], [name]              ACCOUNT NO.:  [id number]   MEDICAL RECORD NO.:  [id number]          PATIENT TYPE:  INP   LOCATION:  1417                         FACILITY:  [facility]   PHYSICIAN:  Peggye Pitt, M.D. DATE OF BIRTH:  1935/02/01   DATE OF ADMISSION:  08/06/2008  DATE OF DISCHARGE:  08/12/2008                               DISCHARGE SUMMARY   DISCHARGE DIAGNOSES:  1. Dyspnea.  2. Congestive heart failure secondary to diastolic dysfunction.  3. Hypertension.  4. Hyperlipidemia.  5. Acute renal insufficiency.  6. Leukocytosis.  7. Hypothyroidism, newly diagnosed.  8. Rheumatoid arthritis.  9. Chronic obstructive pulmonary disease.   DISCHARGE MEDICATIONS:  1. Os-Cal plus vitamin D 1 tablet twice a day.  2. Norvasc 10 mg daily.  3. Aspirin 81 mg daily.  4. Lasix 20 mg daily.  5. Synthroid 50 mcg daily.  6. Lisinopril 5 mg daily.  7. Niacin 100 mg daily.  8. Lovaza 1 gram daily.  9. KCl 20 mEq daily.  10.Zocor 40 mg at bedtime.  11.Folic acid 1 mg daily.  12.Prednisone 20 mg on January 13 and then 10 mg on January 14.   DISPOSITION AND FOLLOWUP:  The patient is discharged home in stable  condition.  She is to follow up with her primary care physician, [name], M.D.  Of note while in the hospital she has had acute  renal insufficiency.  It appears she has a baseline creatinine around  1.2, 1.3.  Her creatinine on day of discharge is 1.43.  I have  recommended that she go to Dr. Carolyn Kennedy his office at the latest  within the next week to have another BMET checked to make sure her renal  function remains stable.  Of note, she is also had a new diagnosis of  hypothyroidism.  She has been started on Synthroid, hence a TSH will  need to be checked in 4-6 weeks to further adjust her treatment.   CONSULTATIONS THIS HOSPITALIZATION:  None.   IMAGES AND PROCEDURES PERFORMED DURING THIS HOSPITALIZATION:  1. A chest x-ray on August 06, 2008 that showed  cardiomegaly and      interval development of edema suggesting failure.  No focal air      space disease.  2. A repeat chest x-ray on August 09, 2008 that showed stable      cardiomegaly and pulmonary vascular congestion with no acute      findings.  3. A VQ scan on August 09, 2008 that showed a very low probability for      pulmonary embolism.   HISTORY AND PHYSICAL EXAM:  For full details please refer to history and  physical dictated on August 06, 2008 by Dr. Christella Kennedy but in brief [name] is a pleasant 76 year old African American woman who presented  to the emergency department with shortness of breath and bilateral lower  extremity edema, worsening over the past 2 weeks.  For that reason we  are called for admission for further evaluation and management.   HOSPITAL COURSE:  1. Dyspnea.  We believe at this point that her dyspnea  is      multifactorial secondary to a variety of conditions including      morbid obesity, diastolic heart failure, COPD, possibly obstructive      sleep apnea and obesity hypoventilation syndrome as well as right      heart failure.  She has been diuresed while in the hospital.  Her      dyspnea has completely resolved.  She was initially placed on a      steroid taper for possible COPD exacerbation and she has two more      days left of this treatment.  2. For her hypothyroidism which was a new diagnosis while in the      hospital with a TSH of 4.627 which was elevated and a free T4 that      was decreased at 0.82, she was started on Synthroid 50 mcg daily      and this will need to be further adjusted when a TSH is checked in      4 to 6 weeks.  3. Acute renal insufficiency.  We believe at this point that secondary      to the Lasix that she was receiving for her diuresis, she developed      acute renal insufficiency with a creatinine as high as 1.9.  It      appears that her baseline is around 1.2-1.3.  On day of discharge      her creatinine  is 1.43 and she will need a BMET within the next      week or two to ensure that her renal function continues to improve.  4. For her hypertension, we made no changes to her regimen while in      the hospital.  5. Leukocytosis.  She did have a leukocytosis as high as 17,000.      However, she remained afebrile and there was no obvious source of      infection with a urinalysis and chest x-ray that showed no evidence      of infection.  On day of discharge her white count has decreased to      10.9.  6. The rest of her chronic medical issues were not a problem this      hospitalization.   VITAL SIGNS ON DAY OF DISCHARGE:  Blood pressure 143/68, heart rate 87,  respirations 21, O2 sats 99% on 2 L with a temperature of 98.1.   LABS ON DAY OF DISCHARGE:  Sodium 141, potassium 3.9, chloride 101,  bicarb 32, BUN 28, creatinine 1.43, glucose of 128.  WBCs 10.9,  hemoglobin 11.8 and platelets of 275.      Peggye Pitt, M.D.  Electronically Signed     EH/MEDQ  D:  08/12/2008  T:  08/12/2008  Job:  [id number]   cc:   [name], M.D.  Fax: [phone]

## 2010-12-17 ENCOUNTER — Other Ambulatory Visit (HOSPITAL_COMMUNITY): Payer: Self-pay | Admitting: Family Medicine

## 2010-12-17 DIAGNOSIS — Z1231 Encounter for screening mammogram for malignant neoplasm of breast: Secondary | ICD-10-CM

## 2010-12-17 NOTE — Consult Note (Signed)
[name], Kennedy              ACCOUNT NO.:  [id number]   MEDICAL RECORD NO.:  [id number]          PATIENT TYPE:  INP   LOCATION:  0462                         FACILITY:  [facility]   PHYSICIAN:  Marlan Palau, M.D.  DATE OF BIRTH:  [birth year]   DATE OF CONSULTATION:  04/21/2004  DATE OF DISCHARGE:                                   CONSULTATION   HISTORY OF PRESENT ILLNESS:  [name] is a 76 year old right-handed  black female born on July 15, 1935, with history of rheumatoid arthritis  and hypertension. This patient comes to [facility] for an  evaluation of vertigo, nausea, and vomiting that began approximately one  week prior to this evaluation. The patient was in the hospital for several  days as part of the workup and treatment of the vertigo and nausea. The  patient was discharged several days ago, but returned with recurrent  vertigo, nausea, and vomiting. The patient claims she feels weak all over.  Denied headache, denied slurred speech, problem swallowing, or double  vision. The patient denies any focal numbness or weakness in the face, arms,  or legs, but again feels generally weak, washed out. The patient denies any  neck pain or low back pain. The patient has not had any blackout episodes or  syncope.  The patient underwent an MRI scan of the brain on the 19th of  September 2005 showing evidence of a subacute right cerebellar  cerebrovascular infarction. The patient has an MR angiogram of the  intracranial vessels that were unremarkable. Neurology is asked to see this  patient for further evaluation. The patient has been set up for a carotid  Doppler study and has been placed on aspirin.   PAST MEDICAL HISTORY:  1.  Dizziness and vertigo with a right cerebellar cerebrovascular infarction      by MRI scan.  2.  Obesity.  3.  Hypertension.  4.  Rheumatoid arthritis, osteoarthritis.  5.  Chronic cholangitis.  6.  COPD, asthma.  7.  History of  hysterectomy in the past.   CURRENT MEDICATIONS:  1.  Aspirin 325 mg a day.  2.  Prevacid 30 mg daily.  3.  Procardia 30 mg daily.  4.  Prednisone 5 mg daily.  5.  Ventolin inhaler q.4h. p.r.n.  6.  Morphine if needed.  7.  Promethazine if needed.  8.  Ultram if needed.  9.  Milk of magnesia if needed.   ALLERGIES:  The patient has no known allergies.   SOCIAL HISTORY:  Does not smoke or drink. The patient lives in the  [place] area. She is in her second marriage and has one son who is alive  and well. The patient is not employed.   FAMILY MEDICAL HISTORY:  Mother died with cancer. Father died with stroke.  The patient has nine brothers and sisters, five of which have passed away.  Several sisters, at least two, had cancer. History of diabetes in the  family. At least one sibling was killed.   REVIEW OF SYSTEMS:  Notable for no fevers. The patient does note some  chills. Does note some chronic shortness of breath. Denies chest pain. Does  note some nausea or vomiting. Denies any focal numbness or weakness on the  face, arms, or legs. Has vertigo with sitting, feels better with lying down.  Denies any tinnitus, hearing loss, or ear pain.   PHYSICAL EXAMINATION:  VITAL SIGNS: Blood pressure is 138/69, heart rate 83,  respiratory rate 18, and temperature afebrile.  GENERAL: The patient is a moderately obese black female who is alert and  cooperative at the time of examination.  HEENT: Head is atraumatic. Pupils are 2 to 3 mm, round, and reactive to  light. Disks are flat bilaterally.  NECK: Supple with no carotid bruits noted.  RESPIRATORY: Clear.  CARDIOVASCULAR: Distant heart sounds with no obvious murmurs or rubs noted.  EXTREMITIES: 1+ edema at the ankles bilaterally.  NEUROLOGIC: Cranial nerves as above. Facial symmetry is present. The patient  has good pinprick sensation over the face bilaterally; has good strength to  facial muscles, muscles of head turning, shoulder  shrug bilaterally. Speech  is well enunciated and not aphasic. Extraocular movements are full. The  patient however  has asymmetry of nystagmus more prominent in gaze nystagmus  to the left, not present when the patient is looking to the right. Visual  fields are full to double-simultaneous stimulation. Again, speech is normal,  not dysarthric or aphasic. Motor testing reveals 5/5 strength in all four  extremities. Good symmetrical motor tone noted throughout. Sensory testing  is intact to pinprick, soft touch, and vibratory sensation throughout. The  patient has finger-nose-finger and toe-to-finger bilaterally. Rapid  alternating movement of upper extremities are symmetric and normal. No drift  is seen. Deep tendon reflexes are symmetrical and normal. Toes are neutral  bilaterally. Again, toe-to-finger and finger-nose-finger are symmetric  bilaterally. The patient is not ambulated.   Laboratory values are notable for cortisol level of 16.2. The patient has a  white count of 8.1, hemoglobin 12.9, hematocrit 39.7, MCV 80.5, platelet  count 346,000. Sodium 139, potassium 3.2, chloride 106, CO2 27, glucose 115,  BUN 14, creatinine 0.7, calcium 9.3.   EKG reveals sinus bradycardia, otherwise normal, with heart rate of 59.   IMPRESSION:  1.  New onset of vertigo associated with a right subacute cerebellar,      cerebrovascular infarction.  2.  Rheumatoid arthritis.  3.  Hypertension.  4.  Obesity.   This patient has vertigo likely associated with the right cerebellar  infarct. The patient's clinical history, however, would be consistent with a  benign positional vertigo history interestingly. The patient has not had any  brainstem involvement at this time. The patient should do well in the  future, but a stroke workup should be undertaken at this point. The patient  is now on aspirin, which is appropriate. Intracranial MR angiogram looks unremarkable. Will proceed with further  workup.   PLAN:  1.  MR angiogram of the extracranial vessels.  2.  2-D echocardiogram.  3.  Continue aspirin therapy for now.  4.  Physical and occupational therapy. I believe that rehab has been      consulted.  5.  Will follow this patient's clinical course while in-house.      CKW/MEDQ  D:  04/21/2004  T:  04/21/2004  Job:  [id number]   cc:   [facility]   Corinna L. Lendell Caprice, MD

## 2010-12-17 NOTE — Assessment & Plan Note (Signed)
[facility]                             PULMONARY OFFICE NOTE   JANESSA, MICKLE                     MRN:          [id number]  DATE:11/06/2006                            DOB:          Dec 07, 1934    This is a 76 year old African American female who has asthmatic  bronchitis and rheumatoid lung with chronic hypoxic respiratory failure  due to the above and also due to obesity with obesity hypoventilation  syndrome. The patient presents today for follow up. We did place her on  Symbicort during her last visit. She actually feels that this is very  helpful and states that this had made her breathing markedly better a  bit. She complains of occasional rattling in the chest, but this is  relieved with the use of p.r.n. Robitussin.   CURRENT MEDICATIONS:  As noted on the intake sheet. These have been  reviewed and are accurate.   PHYSICAL EXAMINATION:  VITAL SIGNS:  As noted. Oxygen saturation is 99%  on 2 liters nasal canula oxygen.  GENERAL:  This is a well developed, obese, African American female who  is no acute distress.  HEENT:  Unremarkable.  NECK:  Supple. No adenopathy noted. No JVD.  LUNGS:  Clear to auscultation bilaterally with some occasional Velcro  crackles noted at the bases. No wheezes noted.  ABDOMEN:  Obese.  EXTREMITIES:  No cyanosis, clubbing, or edema.   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 The patient  is currently fairly well compensated.   PLAN:  1. The patient will continue Symbicort 160/4.5 2 inhalations twice      daily.  2. I have advised the patient to use p.r.n. Mucinex for her occasional      mucus retention issues.  3. Follow up will be in 6-8 weeks time. She is to contact us prior to      that time should any problems arise. At that time, I will  reassign      her to another physician in our practice as I will be leaving the      practice.     Gailen Shelter, MD  Electronically Signed    CLG/MedQ  DD: 11/09/2006  DT: 11/10/2006  Job #: [id number]

## 2010-12-24 ENCOUNTER — Ambulatory Visit (HOSPITAL_COMMUNITY)
Admission: RE | Admit: 2010-12-24 | Discharge: 2010-12-24 | Disposition: A | Discharge status: Home / Self Care | Payer: Medicare Other | Source: Ambulatory Visit | Attending: Family Medicine | Admitting: Family Medicine

## 2010-12-24 DIAGNOSIS — Z1231 Encounter for screening mammogram for malignant neoplasm of breast: Secondary | ICD-10-CM | POA: Insufficient documentation

## 2011-02-14 ENCOUNTER — Ambulatory Visit: Payer: Medicare Other | Admitting: Pulmonary Disease

## 2011-03-08 ENCOUNTER — Encounter: Payer: Self-pay | Admitting: Pulmonary Disease

## 2011-03-09 ENCOUNTER — Encounter: Payer: Self-pay | Admitting: Pulmonary Disease

## 2011-03-09 ENCOUNTER — Ambulatory Visit (INDEPENDENT_AMBULATORY_CARE_PROVIDER_SITE_OTHER): Payer: Medicare Other | Admitting: Pulmonary Disease

## 2011-03-09 ENCOUNTER — Ambulatory Visit (HOSPITAL_COMMUNITY)
Admission: RE | Admit: 2011-03-09 | Discharge: 2011-03-09 | Disposition: A | Discharge status: Home / Self Care | Payer: Medicare Other | Source: Ambulatory Visit | Attending: Pulmonary Disease | Admitting: Pulmonary Disease

## 2011-03-09 DIAGNOSIS — J449 Chronic obstructive pulmonary disease, unspecified: Secondary | ICD-10-CM

## 2011-03-09 DIAGNOSIS — J9610 Chronic respiratory failure, unspecified whether with hypoxia or hypercapnia: Secondary | ICD-10-CM

## 2011-03-09 DIAGNOSIS — J438 Other emphysema: Secondary | ICD-10-CM | POA: Insufficient documentation

## 2011-03-09 DIAGNOSIS — J8410 Pulmonary fibrosis, unspecified: Secondary | ICD-10-CM

## 2011-03-09 NOTE — Progress Notes (Signed)
  Subjective:    Patient ID: [name], female    DOB: 1934/10/07, 76 y.o.   MRN: [id number]  HPI The patient comes in today for followup of her known chronic respiratory failure secondary to COPD and mild interstitial disease probably related to her rheumatoid arthritis.  She is continuing on symbicort and p.r.n. Albuterol for her airflow obstruction, and is being treated with Humira and low-dose prednisone by rheumatology for her rheumatoid arthritis.  She feels that her exertional tolerance is at its usual baseline, but does note that she is having worsening arthritis symptoms and malaise.  She denies any significant cough, congestion, or mucus production.  She has had some mild increase in lower extremity edema.   Review of Systems  Constitutional: Negative.  Negative for fever and unexpected weight change.  HENT: Negative.  Negative for ear pain, nosebleeds, congestion, sore throat, rhinorrhea, sneezing, trouble swallowing, dental problem, postnasal drip and sinus pressure.   Eyes: Negative.  Negative for redness and itching.  Respiratory: Positive for shortness of breath. Negative for cough, chest tightness and wheezing.   Cardiovascular: Positive for leg swelling. Negative for palpitations.  Gastrointestinal: Negative.  Negative for nausea and vomiting.  Genitourinary: Negative.  Negative for dysuria.  Musculoskeletal: Negative.  Negative for joint swelling.  Skin: Negative.  Negative for rash.  Neurological: Negative.  Negative for headaches.  Hematological: Negative.  Does not bruise/bleed easily.  Psychiatric/Behavioral: Negative.  Negative for dysphoric mood. The patient is not nervous/anxious.        Objective:   Physical Exam Ow female in nad Nares without discharge or purulence Chest with crackles 1/3 up bilat, no wheezing Cor with rrr LE with 1+ edema bilat, no cyanosis Alert and oriented, moves all 4        Assessment & Plan:

## 2011-03-09 NOTE — Patient Instructions (Signed)
Continue on oxygen and current inhalers.   Speak with Dr. Corliss Skains about treating your RA more aggressively since you are having increased symptoms.  followup with me same day as your husband.

## 2011-03-13 ENCOUNTER — Encounter: Payer: Self-pay | Admitting: Pulmonary Disease

## 2011-03-13 NOTE — Assessment & Plan Note (Signed)
There is nothing to suggest an exacerbation of her known obstructive lung disease, and I have asked her to continue on her symbicort.

## 2011-03-13 NOTE — Assessment & Plan Note (Addendum)
The pt is stable from a radiographic and pft standpoint, and is maintaining sats on her current oxygen therapy.  She is on humira and low dose prednisone for treatment of her RA, but feels poorly with worsening arthritis symptoms.  I have asked her to contact her rheumatologist to setup a visit to discuss worsening symptoms.  May consider f/u echo at some point to re-look at PA pressures.

## 2011-03-16 ENCOUNTER — Encounter: Payer: Self-pay | Admitting: Pulmonary Disease

## 2011-08-16 DIAGNOSIS — J449 Chronic obstructive pulmonary disease, unspecified: Secondary | ICD-10-CM | POA: Diagnosis not present

## 2011-08-16 DIAGNOSIS — M069 Rheumatoid arthritis, unspecified: Secondary | ICD-10-CM | POA: Diagnosis not present

## 2011-08-16 DIAGNOSIS — I10 Essential (primary) hypertension: Secondary | ICD-10-CM | POA: Diagnosis not present

## 2011-08-16 DIAGNOSIS — Z9981 Dependence on supplemental oxygen: Secondary | ICD-10-CM | POA: Diagnosis not present

## 2011-08-22 DIAGNOSIS — M25579 Pain in unspecified ankle and joints of unspecified foot: Secondary | ICD-10-CM | POA: Diagnosis not present

## 2011-08-22 DIAGNOSIS — M25549 Pain in joints of unspecified hand: Secondary | ICD-10-CM | POA: Diagnosis not present

## 2011-08-22 DIAGNOSIS — M069 Rheumatoid arthritis, unspecified: Secondary | ICD-10-CM | POA: Diagnosis not present

## 2011-08-22 DIAGNOSIS — M25519 Pain in unspecified shoulder: Secondary | ICD-10-CM | POA: Diagnosis not present

## 2011-09-08 DIAGNOSIS — L989 Disorder of the skin and subcutaneous tissue, unspecified: Secondary | ICD-10-CM | POA: Diagnosis not present

## 2011-09-08 DIAGNOSIS — L408 Other psoriasis: Secondary | ICD-10-CM | POA: Diagnosis not present

## 2011-09-13 ENCOUNTER — Ambulatory Visit: Payer: Medicare Other | Admitting: Pulmonary Disease

## 2011-09-15 ENCOUNTER — Ambulatory Visit (INDEPENDENT_AMBULATORY_CARE_PROVIDER_SITE_OTHER): Payer: Medicare Other | Admitting: Pulmonary Disease

## 2011-09-15 ENCOUNTER — Encounter: Payer: Self-pay | Admitting: Pulmonary Disease

## 2011-09-15 DIAGNOSIS — J449 Chronic obstructive pulmonary disease, unspecified: Secondary | ICD-10-CM

## 2011-09-15 DIAGNOSIS — J8410 Pulmonary fibrosis, unspecified: Secondary | ICD-10-CM | POA: Diagnosis not present

## 2011-09-15 NOTE — Assessment & Plan Note (Signed)
This has not been a significant issue for the patient in the past, however she will need a followup x-ray at her next visit.

## 2011-09-15 NOTE — Assessment & Plan Note (Signed)
The patient has been stable from this standpoint.  She has not had an acute exacerbation, nor has she had a recent pulmonary infection.  I have asked her to continue on her current inhaler regimen, and try to stay as active as possible.

## 2011-09-15 NOTE — Patient Instructions (Signed)
No change in breathing medications.  Stay on oxygen Will see you back in 6mos, and will check cxr next visit to followup on your lung scarring.

## 2011-09-15 NOTE — Progress Notes (Signed)
  Subjective:    Patient ID: [name], female    DOB: 29-Jul-1935, 77 y.o.   MRN: [id number]  HPI Patient comes in today for followup of her known COPD, and very mild interstitial disease that may be related to her history of rheumatoid arthritis.  She feels that her breathing has been at baseline, and has not had a recent chest infection or acute exacerbation.  She is wearing her oxygen compliantly, but has not required her rescue inhaler.   Review of Systems  Constitutional: Negative for fever and unexpected weight change.  HENT: Positive for rhinorrhea. Negative for ear pain, nosebleeds, congestion, sore throat, sneezing, trouble swallowing, dental problem, postnasal drip and sinus pressure.   Eyes: Negative for redness and itching.  Respiratory: Negative for cough, chest tightness, shortness of breath and wheezing.   Cardiovascular: Positive for leg swelling. Negative for palpitations.  Gastrointestinal: Negative for nausea and vomiting.  Genitourinary: Negative for dysuria.  Musculoskeletal: Negative for joint swelling.  Skin: Positive for rash.  Neurological: Negative for headaches.  Hematological: Bruises/bleeds easily.  Psychiatric/Behavioral: Negative for dysphoric mood. The patient is not nervous/anxious.        Objective:   Physical Exam Overweight female in no acute distress Nose without purulence or discharge noted Chest with mild decreased breath sounds, no wheezing, no significant basilar crackles. Chronic exam with regular rate and rhythm, 2 per 6 systolic murmur Lower extremities with 1-2+ edema, no cyanosis Alert and oriented, moves all 4 extremities.       Assessment & Plan:

## 2011-11-10 DIAGNOSIS — R319 Hematuria, unspecified: Secondary | ICD-10-CM | POA: Diagnosis not present

## 2011-12-06 DIAGNOSIS — Z79899 Other long term (current) drug therapy: Secondary | ICD-10-CM | POA: Diagnosis not present

## 2012-01-04 ENCOUNTER — Other Ambulatory Visit (HOSPITAL_COMMUNITY): Payer: Self-pay | Admitting: Family Medicine

## 2012-01-04 DIAGNOSIS — Z1231 Encounter for screening mammogram for malignant neoplasm of breast: Secondary | ICD-10-CM

## 2012-01-12 DIAGNOSIS — M069 Rheumatoid arthritis, unspecified: Secondary | ICD-10-CM | POA: Diagnosis not present

## 2012-01-12 DIAGNOSIS — M25549 Pain in joints of unspecified hand: Secondary | ICD-10-CM | POA: Diagnosis not present

## 2012-01-12 DIAGNOSIS — L408 Other psoriasis: Secondary | ICD-10-CM | POA: Diagnosis not present

## 2012-01-12 DIAGNOSIS — M25519 Pain in unspecified shoulder: Secondary | ICD-10-CM | POA: Diagnosis not present

## 2012-01-26 ENCOUNTER — Ambulatory Visit (HOSPITAL_COMMUNITY)
Admission: RE | Admit: 2012-01-26 | Discharge: 2012-01-26 | Disposition: A | Discharge status: Home / Self Care | Payer: Medicare Other | Source: Ambulatory Visit | Attending: Family Medicine | Admitting: Family Medicine

## 2012-01-26 DIAGNOSIS — Z1231 Encounter for screening mammogram for malignant neoplasm of breast: Secondary | ICD-10-CM | POA: Diagnosis not present

## 2012-03-07 DIAGNOSIS — H26499 Other secondary cataract, unspecified eye: Secondary | ICD-10-CM | POA: Diagnosis not present

## 2012-03-16 ENCOUNTER — Ambulatory Visit (INDEPENDENT_AMBULATORY_CARE_PROVIDER_SITE_OTHER): Payer: Medicare Other | Admitting: Pulmonary Disease

## 2012-03-16 ENCOUNTER — Encounter: Payer: Self-pay | Admitting: Pulmonary Disease

## 2012-03-16 ENCOUNTER — Ambulatory Visit (INDEPENDENT_AMBULATORY_CARE_PROVIDER_SITE_OTHER)
Admission: RE | Admit: 2012-03-16 | Discharge: 2012-03-16 | Disposition: A | Discharge status: Home / Self Care | Payer: Medicare Other | Source: Ambulatory Visit | Attending: Pulmonary Disease | Admitting: Pulmonary Disease

## 2012-03-16 VITALS — BP 138/82 | HR 83 | Temp 97.7°F | Ht 69.0 in | Wt 205.6 lb

## 2012-03-16 DIAGNOSIS — J449 Chronic obstructive pulmonary disease, unspecified: Secondary | ICD-10-CM

## 2012-03-16 DIAGNOSIS — J8410 Pulmonary fibrosis, unspecified: Secondary | ICD-10-CM | POA: Diagnosis not present

## 2012-03-16 DIAGNOSIS — J9610 Chronic respiratory failure, unspecified whether with hypoxia or hypercapnia: Secondary | ICD-10-CM | POA: Diagnosis not present

## 2012-03-16 NOTE — Assessment & Plan Note (Signed)
The patient has mild interstitial disease, that may be related to her rheumatoid arthritis.  Her chest x-ray today shows no worsening of her interstitial process.  She is already on Humira and prednisone for her rheumatoid disease.

## 2012-03-16 NOTE — Progress Notes (Signed)
  Subjective:    Patient ID: [name], female    DOB: 30-Sep-1934, 77 y.o.   MRN: [id number]  HPI The patient comes in today for followup of her known COPD and mild interstitial disease that is probably secondary to rheumatoid arthritis.  We have followed serial PFTs and x-rays, and they have all been fairly stable.  She had a chest x-ray today that was without change compared to the last.  She has had some ongoing cough with thick white mucus production, but no purulence.  She feels that her breathing is at her usual baseline, and she has been compliant with oxygen.   Review of Systems  Constitutional: Negative for fever and unexpected weight change.  HENT: Positive for rhinorrhea. Negative for ear pain, nosebleeds, congestion, sore throat, sneezing, trouble swallowing, dental problem, postnasal drip and sinus pressure.   Eyes: Negative for redness and itching.  Respiratory: Positive for cough and shortness of breath. Negative for chest tightness and wheezing.   Cardiovascular: Negative for palpitations and leg swelling.  Gastrointestinal: Negative for nausea and vomiting.  Genitourinary: Negative for dysuria.  Musculoskeletal: Negative for joint swelling.  Skin: Negative for rash.  Neurological: Negative for headaches.  Hematological: Bruises/bleeds easily.  Psychiatric/Behavioral: Negative for dysphoric mood. The patient is not nervous/anxious.   All other systems reviewed and are negative.       Objective:   Physical Exam Constitutional:  overweight, no acute distress  HENT:  Nares patent without discharge  Oropharynx without exudate, palate and uvula are normal  Cardiovascular:  Normal rate, regular rhythm, no rubs or gallops.  1/6 sem        Intact distal pulses  Pulmonary :  Normal breath sounds, no stridor or respiratory distress   No rhonchi, or wheezing.  Faint basilar crackles.  LE with mild edema, no cyanosis  Neurologic:  Alert, appropriate, moves all 4  extremities without obvious deficit.         Assessment & Plan:

## 2012-03-16 NOTE — Patient Instructions (Addendum)
Continue on symbicort and oxygen. Try mucinex dm regular strength, 1-2 each am and pm for a few weeks to see if helps mucus.  Take with a large glass of water. If remaining stable, followup with me in 6mos.

## 2012-03-16 NOTE — Assessment & Plan Note (Signed)
The patient is been stable from a COPD standpoint on her current bronchodilator regimen.  She has not had any pulmonary infection or acute exacerbation since last visit.  I have asked her to continue on symbicort.

## 2012-05-16 DIAGNOSIS — M25569 Pain in unspecified knee: Secondary | ICD-10-CM | POA: Diagnosis not present

## 2012-05-16 DIAGNOSIS — Z110 Encounter for screening for intestinal infectious diseases: Secondary | ICD-10-CM | POA: Diagnosis not present

## 2012-05-16 DIAGNOSIS — M069 Rheumatoid arthritis, unspecified: Secondary | ICD-10-CM | POA: Diagnosis not present

## 2012-05-16 DIAGNOSIS — M2550 Pain in unspecified joint: Secondary | ICD-10-CM | POA: Diagnosis not present

## 2012-05-16 DIAGNOSIS — Z79899 Other long term (current) drug therapy: Secondary | ICD-10-CM | POA: Diagnosis not present

## 2012-06-06 ENCOUNTER — Emergency Department (HOSPITAL_COMMUNITY)
Admission: EM | Admit: 2012-06-06 | Discharge: 2012-06-06 | Disposition: A | Discharge status: Home / Self Care | Payer: Medicare Other | Attending: Emergency Medicine | Admitting: Emergency Medicine

## 2012-06-06 ENCOUNTER — Encounter (HOSPITAL_COMMUNITY): Payer: Self-pay | Admitting: *Deleted

## 2012-06-06 DIAGNOSIS — Z8709 Personal history of other diseases of the respiratory system: Secondary | ICD-10-CM | POA: Insufficient documentation

## 2012-06-06 DIAGNOSIS — M899 Disorder of bone, unspecified: Secondary | ICD-10-CM | POA: Diagnosis not present

## 2012-06-06 DIAGNOSIS — J438 Other emphysema: Secondary | ICD-10-CM | POA: Diagnosis not present

## 2012-06-06 DIAGNOSIS — Z7982 Long term (current) use of aspirin: Secondary | ICD-10-CM | POA: Insufficient documentation

## 2012-06-06 DIAGNOSIS — N390 Urinary tract infection, site not specified: Secondary | ICD-10-CM | POA: Insufficient documentation

## 2012-06-06 DIAGNOSIS — M069 Rheumatoid arthritis, unspecified: Secondary | ICD-10-CM | POA: Diagnosis not present

## 2012-06-06 DIAGNOSIS — I10 Essential (primary) hypertension: Secondary | ICD-10-CM | POA: Diagnosis not present

## 2012-06-06 DIAGNOSIS — Z87891 Personal history of nicotine dependence: Secondary | ICD-10-CM | POA: Insufficient documentation

## 2012-06-06 DIAGNOSIS — Z79899 Other long term (current) drug therapy: Secondary | ICD-10-CM | POA: Diagnosis not present

## 2012-06-06 DIAGNOSIS — R319 Hematuria, unspecified: Secondary | ICD-10-CM | POA: Diagnosis not present

## 2012-06-06 HISTORY — DX: Psoriasis, unspecified: L40.9

## 2012-06-06 LAB — CBC WITH DIFFERENTIAL/PLATELET
Basophils Absolute: 0 10*3/uL (ref 0.0–0.1)
Eosinophils Relative: 2 % (ref 0–5)
HCT: 38.4 % (ref 36.0–46.0)
Lymphocytes Relative: 37 % (ref 12–46)
Lymphs Abs: 3 10*3/uL (ref 0.7–4.0)
MCV: 87.1 fL (ref 78.0–100.0)
Monocytes Absolute: 0.7 10*3/uL (ref 0.1–1.0)
Monocytes Relative: 9 % (ref 3–12)
RDW: 13.7 % (ref 11.5–15.5)
WBC: 8.1 10*3/uL (ref 4.0–10.5)

## 2012-06-06 LAB — URINALYSIS, ROUTINE W REFLEX MICROSCOPIC
Bilirubin Urine: NEGATIVE
Ketones, ur: NEGATIVE mg/dL
Nitrite: NEGATIVE
Urobilinogen, UA: 1 mg/dL (ref 0.0–1.0)

## 2012-06-06 LAB — COMPREHENSIVE METABOLIC PANEL
BUN: 30 mg/dL — ABNORMAL HIGH (ref 6–23)
CO2: 26 mEq/L (ref 19–32)
Calcium: 9.7 mg/dL (ref 8.4–10.5)
Creatinine, Ser: 1.39 mg/dL — ABNORMAL HIGH (ref 0.50–1.10)
GFR calc Af Amer: 42 mL/min — ABNORMAL LOW (ref >90)
GFR calc non Af Amer: 36 mL/min — ABNORMAL LOW (ref >90)
Glucose, Bld: 82 mg/dL (ref 70–99)

## 2012-06-06 LAB — URINE MICROSCOPIC-ADD ON

## 2012-06-06 LAB — PROTIME-INR
INR: 0.89 (ref 0.00–1.49)
Prothrombin Time: 12 seconds (ref 11.6–15.2)

## 2012-06-06 LAB — WET PREP, GENITAL: Clue Cells Wet Prep HPF POC: NONE SEEN

## 2012-06-06 LAB — APTT: aPTT: 34 seconds (ref 24–37)

## 2012-06-06 LAB — OCCULT BLOOD, POC DEVICE: Fecal Occult Bld: POSITIVE

## 2012-06-06 MED ORDER — CIPROFLOXACIN HCL 500 MG PO TABS: 500.0000 mg | ORAL_TABLET | Freq: Two times a day (BID) | ORAL | Status: DC

## 2012-06-06 NOTE — ED Notes (Addendum)
Pt states "I'm bleeding a lot from my vagina." reports bleeding started this am. States there was dried blood on pajamas this am and then "when I went to the [facility] it was coming out in big chunks." Pt denies complaints of pain, denies dysuria. Reports urinary frequency. Pt is on home 02 at 2L

## 2012-06-06 NOTE — ED Provider Notes (Signed)
History     CSN: [id number]  Arrival date & time 06/06/12  1118   First MD Initiated Contact with Patient 06/06/12 1144      Chief Complaint  Patient presents with  . Vaginal Bleeding    (Consider location/radiation/quality/duration/timing/severity/associated sxs/prior treatment) HPI Comments: Patient presents today with a chief complaint of what she thinks is vaginal bleeding.  She reports that she noticed blood in her underwear and pajamas earlier this morning, which she assumed was from her vagina.  She reports that the blood was bright red.  She denies any abdominal or flank pain.  Denies nausea or vomiting.  Denies vaginal pain.  Denies dysuria or  urgency.  However, she reports that she has been urinating more frequently.   Past surgical history significant for Vesicovaginal fistula closure with TAH that was performed in 1962.  She reports that she has never had bleeding like this before.    The history is provided by the patient.    Past Medical History  Diagnosis Date  . Interstitial lung disease   . Osteopenia   . Rheumatoid arthritis   . Emphysema   . Hypertension   . Vertigo   . Pthirus inguinalis   . Psoriasis     Past Surgical History  Procedure Date  . Vesicovaginal fistula closure w/ tah 1962  . Cholecystectomy     Family History  Problem Relation Age of Onset  . Heart disease Sister   . Colon cancer Sister   . Asthma Sister     History  Substance Use Topics  . Smoking status: Former Smoker -- 0.5 packs/day for 50 years    Types: Cigarettes    Quit date: 08/01/2000  . Smokeless tobacco: Never Used  . Alcohol Use: No    OB History    Grav Para Term Preterm Abortions TAB SAB Ect Mult Living                  Review of Systems  Constitutional: Negative for fever and chills.  Gastrointestinal: Negative for nausea, vomiting, abdominal pain, diarrhea, constipation, blood in stool and anal bleeding.  Genitourinary: Negative for dysuria, flank  pain, vaginal discharge, difficulty urinating, vaginal pain and pelvic pain.  Neurological: Negative for dizziness and light-headedness.    Allergies  Review of patient's allergies indicates no known allergies.  Home Medications   Current Outpatient Rx  Name  Route  Sig  Dispense  Refill  . HUMIRA PEN [place]      As directed          . ALBUTEROL SULFATE HFA 108 (90 BASE) MCG/ACT IN AERS   Inhalation   Inhale 2 puffs into the lungs every 6 (six) hours as needed.           Marland Kitchen AMLODIPINE BESYLATE 5 MG PO TABS   Oral   Take 5 mg by mouth daily.           . ASPIRIN 81 MG PO TABS   Oral   Take 81 mg by mouth daily.           . BUDESONIDE-FORMOTEROL FUMARATE 160-4.5 MCG/ACT IN AERO   Inhalation   Inhale 2 puffs into the lungs 2 (two) times daily.           Marland Kitchen CALCIUM CARBONATE-VITAMIN D 500-200 MG-UNIT PO TABS   Oral   Take 1 tablet by mouth daily.           Marland Kitchen CLOBETASOL PROPIONATE 0.05 % EX SOLN  as directed.         Rhea Belton EX   Apply externally   Apply topically as directed.         . ENALAPRIL MALEATE 5 MG PO TABS   Oral   Take 1 tablet by mouth daily.         Marland Kitchen FOLIC ACID 800 MCG PO TABS   Oral   Take 400 mcg by mouth daily.           . FUROSEMIDE 20 MG PO TABS   Oral   Take 20 mg by mouth daily.         Marland Kitchen HYDROCHLOROTHIAZIDE 25 MG PO TABS   Oral   Take 1 tablet by mouth daily.         Marland Kitchen HYDROCODONE-ACETAMINOPHEN 5-500 MG PO TABS   Oral   Take 1 tablet by mouth every 6 (six) hours as needed.           Marland Kitchen FISH OIL 1000 MG PO CAPS   Oral   Take 1 capsule by mouth daily.           Marland Kitchen OMEPRAZOLE 20 MG PO CPDR   Oral   Take 40 mg by mouth daily.          Marland Kitchen PREDNISONE 5 MG PO TABS   Oral   Take 5 mg by mouth daily.             BP 154/80  Pulse 78  Temp 98.8 F (37.1 C) (Oral)  Resp 20  SpO2 98%  Physical Exam  Nursing note and vitals reviewed. Constitutional: She appears well-developed and well-nourished. No  distress.  HENT:  Head: Normocephalic and atraumatic.  Mouth/Throat: Oropharynx is clear and moist.  Cardiovascular: Normal rate, regular rhythm and normal heart sounds.   Pulmonary/Chest: Effort normal and breath sounds normal.  Abdominal: Soft. Bowel sounds are normal. She exhibits no distension and no mass. There is no tenderness. There is no rebound and no guarding.  Genitourinary: Rectal exam shows no external hemorrhoid, no internal hemorrhoid, no fissure, no mass and no tenderness. Right adnexum displays no mass, no tenderness and no fullness. Left adnexum displays no mass, no tenderness and no fullness. No tenderness or bleeding around the vagina. No vaginal discharge found.       No gross blood visualized with rectal exam No active vaginal bleeding visualized with pelvic exam  Neurological: She is alert.  Skin: Skin is warm and dry. She is not diaphoretic.  Psychiatric: She has a normal mood and affect.    ED Course  Procedures (including critical care time)   Labs Reviewed  CBC WITH DIFFERENTIAL  COMPREHENSIVE METABOLIC PANEL  PROTIME-INR  APTT  URINALYSIS, ROUTINE W REFLEX MICROSCOPIC   No results found.   No diagnosis found.  Patient discussed with Dr. Estell Harpin.    MDM  Patient presenting today with what she thought was vaginal bleeding.  No blood visualized on pelvic exam with speculum.  Past surgical history significant for TAH.  Therefore, do not think that the bleeding is from the vaginal.  No gross blood visualized during rectal exam, but hemoccult positive.  In and out cath performed to obtain urine sample, which did show blood in her urine.  Patient treated for a UTI and given follow up with Urology if hematuria persists.  Patient also instructed to follow up with PCP.  Labs including hemoglobin are stable.          Pascal Lux  Maralyn Sago, PA-C 06/07/12 1035

## 2012-06-07 LAB — GC/CHLAMYDIA PROBE AMP, GENITAL
Chlamydia, DNA Probe: NEGATIVE
GC Probe Amp, Genital: NEGATIVE

## 2012-06-08 LAB — URINE CULTURE: Culture: NO GROWTH

## 2012-06-12 NOTE — ED Provider Notes (Signed)
Medical screening examination/treatment/procedure(s) were performed by non-physician practitioner and as supervising physician I was immediately available for consultation/collaboration.   Damarcus Reggio L Ryosuke Ericksen, MD 06/12/12 1419 

## 2012-06-19 DIAGNOSIS — R300 Dysuria: Secondary | ICD-10-CM | POA: Diagnosis not present

## 2012-06-19 DIAGNOSIS — L408 Other psoriasis: Secondary | ICD-10-CM | POA: Diagnosis not present

## 2012-06-19 DIAGNOSIS — Z23 Encounter for immunization: Secondary | ICD-10-CM | POA: Diagnosis not present

## 2012-07-18 DIAGNOSIS — L408 Other psoriasis: Secondary | ICD-10-CM | POA: Diagnosis not present

## 2012-09-19 DIAGNOSIS — B358 Other dermatophytoses: Secondary | ICD-10-CM | POA: Diagnosis not present

## 2012-09-19 DIAGNOSIS — L989 Disorder of the skin and subcutaneous tissue, unspecified: Secondary | ICD-10-CM | POA: Diagnosis not present

## 2012-09-19 DIAGNOSIS — L819 Disorder of pigmentation, unspecified: Secondary | ICD-10-CM | POA: Diagnosis not present

## 2012-09-21 ENCOUNTER — Ambulatory Visit: Payer: Medicare Other | Admitting: Pulmonary Disease

## 2012-09-24 ENCOUNTER — Ambulatory Visit: Payer: Medicare Other | Admitting: Pulmonary Disease

## 2012-10-18 DIAGNOSIS — M25519 Pain in unspecified shoulder: Secondary | ICD-10-CM | POA: Diagnosis not present

## 2012-10-18 DIAGNOSIS — Z09 Encounter for follow-up examination after completed treatment for conditions other than malignant neoplasm: Secondary | ICD-10-CM | POA: Diagnosis not present

## 2012-10-18 DIAGNOSIS — Z79899 Other long term (current) drug therapy: Secondary | ICD-10-CM | POA: Diagnosis not present

## 2012-10-18 DIAGNOSIS — J84115 Respiratory bronchiolitis interstitial lung disease: Secondary | ICD-10-CM | POA: Diagnosis not present

## 2012-10-18 DIAGNOSIS — M069 Rheumatoid arthritis, unspecified: Secondary | ICD-10-CM | POA: Diagnosis not present

## 2012-10-18 DIAGNOSIS — L408 Other psoriasis: Secondary | ICD-10-CM | POA: Diagnosis not present

## 2012-11-06 DIAGNOSIS — L819 Disorder of pigmentation, unspecified: Secondary | ICD-10-CM | POA: Diagnosis not present

## 2012-11-06 DIAGNOSIS — L219 Seborrheic dermatitis, unspecified: Secondary | ICD-10-CM | POA: Diagnosis not present

## 2012-12-11 DIAGNOSIS — I10 Essential (primary) hypertension: Secondary | ICD-10-CM | POA: Diagnosis not present

## 2012-12-11 DIAGNOSIS — J449 Chronic obstructive pulmonary disease, unspecified: Secondary | ICD-10-CM | POA: Diagnosis not present

## 2012-12-11 DIAGNOSIS — R1310 Dysphagia, unspecified: Secondary | ICD-10-CM | POA: Diagnosis not present

## 2012-12-11 DIAGNOSIS — R112 Nausea with vomiting, unspecified: Secondary | ICD-10-CM | POA: Diagnosis not present

## 2012-12-31 DIAGNOSIS — Z0389 Encounter for observation for other suspected diseases and conditions ruled out: Secondary | ICD-10-CM | POA: Diagnosis not present

## 2012-12-31 DIAGNOSIS — Z79899 Other long term (current) drug therapy: Secondary | ICD-10-CM | POA: Diagnosis not present

## 2013-01-08 DIAGNOSIS — Z111 Encounter for screening for respiratory tuberculosis: Secondary | ICD-10-CM | POA: Diagnosis not present

## 2013-01-08 DIAGNOSIS — Z0389 Encounter for observation for other suspected diseases and conditions ruled out: Secondary | ICD-10-CM | POA: Diagnosis not present

## 2013-01-23 DIAGNOSIS — F411 Generalized anxiety disorder: Secondary | ICD-10-CM | POA: Diagnosis not present

## 2013-01-23 DIAGNOSIS — I10 Essential (primary) hypertension: Secondary | ICD-10-CM | POA: Diagnosis not present

## 2013-02-18 DIAGNOSIS — R300 Dysuria: Secondary | ICD-10-CM | POA: Diagnosis not present

## 2013-02-25 DIAGNOSIS — R300 Dysuria: Secondary | ICD-10-CM | POA: Diagnosis not present

## 2013-02-25 DIAGNOSIS — R350 Frequency of micturition: Secondary | ICD-10-CM | POA: Diagnosis not present

## 2013-02-25 DIAGNOSIS — N3941 Urge incontinence: Secondary | ICD-10-CM | POA: Diagnosis not present

## 2013-02-25 DIAGNOSIS — N949 Unspecified condition associated with female genital organs and menstrual cycle: Secondary | ICD-10-CM | POA: Diagnosis not present

## 2013-03-20 ENCOUNTER — Other Ambulatory Visit (HOSPITAL_COMMUNITY): Payer: Self-pay | Admitting: Family Medicine

## 2013-03-20 DIAGNOSIS — Z1231 Encounter for screening mammogram for malignant neoplasm of breast: Secondary | ICD-10-CM

## 2013-03-21 DIAGNOSIS — M069 Rheumatoid arthritis, unspecified: Secondary | ICD-10-CM | POA: Diagnosis not present

## 2013-03-21 DIAGNOSIS — L408 Other psoriasis: Secondary | ICD-10-CM | POA: Diagnosis not present

## 2013-03-21 DIAGNOSIS — M25569 Pain in unspecified knee: Secondary | ICD-10-CM | POA: Diagnosis not present

## 2013-03-21 DIAGNOSIS — M25519 Pain in unspecified shoulder: Secondary | ICD-10-CM | POA: Diagnosis not present

## 2013-03-26 ENCOUNTER — Ambulatory Visit (INDEPENDENT_AMBULATORY_CARE_PROVIDER_SITE_OTHER): Payer: Medicare Other | Admitting: Pulmonary Disease

## 2013-03-26 ENCOUNTER — Ambulatory Visit (INDEPENDENT_AMBULATORY_CARE_PROVIDER_SITE_OTHER)
Admission: RE | Admit: 2013-03-26 | Discharge: 2013-03-26 | Disposition: A | Discharge status: Home / Self Care | Payer: Medicare Other | Source: Ambulatory Visit | Attending: Pulmonary Disease | Admitting: Pulmonary Disease

## 2013-03-26 ENCOUNTER — Encounter: Payer: Self-pay | Admitting: Pulmonary Disease

## 2013-03-26 VITALS — BP 132/82 | HR 81 | Temp 97.0°F | Ht 69.0 in | Wt 205.6 lb

## 2013-03-26 DIAGNOSIS — J9610 Chronic respiratory failure, unspecified whether with hypoxia or hypercapnia: Secondary | ICD-10-CM

## 2013-03-26 DIAGNOSIS — J984 Other disorders of lung: Secondary | ICD-10-CM | POA: Diagnosis not present

## 2013-03-26 DIAGNOSIS — J8410 Pulmonary fibrosis, unspecified: Secondary | ICD-10-CM

## 2013-03-26 DIAGNOSIS — J449 Chronic obstructive pulmonary disease, unspecified: Secondary | ICD-10-CM

## 2013-03-26 DIAGNOSIS — R0602 Shortness of breath: Secondary | ICD-10-CM | POA: Diagnosis not present

## 2013-03-26 DIAGNOSIS — Z0000 Encounter for general adult medical examination without abnormal findings: Secondary | ICD-10-CM | POA: Diagnosis not present

## 2013-03-26 NOTE — Assessment & Plan Note (Signed)
The patient's chest x-ray today appears stable, and her PFTs from 2012 were actually improved from prior.  She is continuing on her biologic medication for her RA, and low-dose prednisone.  Because she is at a stable baseline from a symptom standpoint, I would recommend no further aggressive treatment of her interstitial disease.  This will need to be followed over time, and will need pulmonary function studies after her next visit.

## 2013-03-26 NOTE — Assessment & Plan Note (Signed)
The patient appears to be stable from a COPD standpoint at this time on her bronchodilator medication.  I've asked her to continue this, and to work on weight loss and some type of conditioning program

## 2013-03-26 NOTE — Progress Notes (Signed)
  Subjective:    Patient ID: [name], female    DOB: Aug 22, 1934, 78 y.o.   MRN: [id number]  HPI The patient comes in today for followup of her chronic respiratory failure secondary to COPD and interstitial lung disease associated with rheumatoid arthritis.  The patient continues on her bronchodilator regimen as well as oxygen, and feels overall that she is near her usual baseline.  She has a mild cough with mucus in her throat, but suspects this is secondary to postnasal drip.  She does not have chest congestion.  She does have have palpitations on occasion, but has had a Holter monitor in the past which was unremarkable.  She continues on her biologic for her rheumatoid arthritis, as well as low-dose prednisone.  Her chest x-ray today shows no change from prior.   Review of Systems  Constitutional: Negative for fever and unexpected weight change.  HENT: Negative for ear pain, nosebleeds, congestion, sore throat, rhinorrhea, sneezing, trouble swallowing, dental problem, postnasal drip and sinus pressure.   Eyes: Negative for redness and itching.  Respiratory: Positive for shortness of breath. Negative for cough, chest tightness and wheezing.   Cardiovascular: Positive for palpitations. Negative for leg swelling.  Gastrointestinal: Negative for nausea and vomiting.  Genitourinary: Negative for dysuria.  Musculoskeletal: Negative for joint swelling.  Skin: Negative for rash.  Neurological: Negative for headaches.  Hematological: Does not bruise/bleed easily.  Psychiatric/Behavioral: Negative for dysphoric mood. The patient is not nervous/anxious.        Objective:   Physical Exam Overweight female in no acute distress Nose without purulence or discharge noted Neck without lymphadenopathy or thyromegaly Chest with mild basilar crackles, no wheezes Cardiac exam with regular rate and rhythm, no MRG Lower extremities with 1+ edema bilaterally, no cyanosis Alert and oriented, moves  all 4 extremities       Assessment & Plan:

## 2013-03-26 NOTE — Patient Instructions (Addendum)
Try chlorpheniramine 4mg  OTC, take 2 each night at bedtime if you are having mucus in throat with postnasal drip.   Get pharmacist to help you find if having trouble. Stay on symbicort twice a day, as well as oxygen Stay active if possible. followup with me in 6mos.

## 2013-03-28 ENCOUNTER — Ambulatory Visit (HOSPITAL_COMMUNITY)
Admission: RE | Admit: 2013-03-28 | Discharge: 2013-03-28 | Disposition: A | Discharge status: Home / Self Care | Payer: Medicare Other | Source: Ambulatory Visit | Attending: Family Medicine | Admitting: Family Medicine

## 2013-03-28 DIAGNOSIS — Z1231 Encounter for screening mammogram for malignant neoplasm of breast: Secondary | ICD-10-CM

## 2013-04-10 DIAGNOSIS — Z79899 Other long term (current) drug therapy: Secondary | ICD-10-CM | POA: Diagnosis not present

## 2013-06-11 DIAGNOSIS — L910 Hypertrophic scar: Secondary | ICD-10-CM | POA: Diagnosis not present

## 2013-06-11 DIAGNOSIS — I10 Essential (primary) hypertension: Secondary | ICD-10-CM | POA: Diagnosis not present

## 2013-06-11 DIAGNOSIS — Z23 Encounter for immunization: Secondary | ICD-10-CM | POA: Diagnosis not present

## 2013-07-10 DIAGNOSIS — L819 Disorder of pigmentation, unspecified: Secondary | ICD-10-CM | POA: Diagnosis not present

## 2013-07-10 DIAGNOSIS — L408 Other psoriasis: Secondary | ICD-10-CM | POA: Diagnosis not present

## 2013-07-10 DIAGNOSIS — L219 Seborrheic dermatitis, unspecified: Secondary | ICD-10-CM | POA: Diagnosis not present

## 2013-08-28 DIAGNOSIS — Z79899 Other long term (current) drug therapy: Secondary | ICD-10-CM | POA: Diagnosis not present

## 2013-08-28 DIAGNOSIS — Z0389 Encounter for observation for other suspected diseases and conditions ruled out: Secondary | ICD-10-CM | POA: Diagnosis not present

## 2013-09-25 ENCOUNTER — Ambulatory Visit: Payer: Medicare Other | Admitting: Pulmonary Disease

## 2013-10-02 ENCOUNTER — Ambulatory Visit: Payer: Medicare Other | Admitting: Pulmonary Disease

## 2013-10-07 DIAGNOSIS — I10 Essential (primary) hypertension: Secondary | ICD-10-CM | POA: Diagnosis not present

## 2013-10-07 DIAGNOSIS — N183 Chronic kidney disease, stage 3 unspecified: Secondary | ICD-10-CM | POA: Diagnosis not present

## 2013-10-16 DIAGNOSIS — L408 Other psoriasis: Secondary | ICD-10-CM | POA: Diagnosis not present

## 2013-10-16 DIAGNOSIS — M545 Low back pain, unspecified: Secondary | ICD-10-CM | POA: Diagnosis not present

## 2013-10-16 DIAGNOSIS — M069 Rheumatoid arthritis, unspecified: Secondary | ICD-10-CM | POA: Diagnosis not present

## 2013-10-16 DIAGNOSIS — Z09 Encounter for follow-up examination after completed treatment for conditions other than malignant neoplasm: Secondary | ICD-10-CM | POA: Diagnosis not present

## 2013-11-27 DIAGNOSIS — Z79899 Other long term (current) drug therapy: Secondary | ICD-10-CM | POA: Diagnosis not present

## 2013-12-10 DIAGNOSIS — M069 Rheumatoid arthritis, unspecified: Secondary | ICD-10-CM | POA: Diagnosis not present

## 2013-12-10 DIAGNOSIS — N183 Chronic kidney disease, stage 3 unspecified: Secondary | ICD-10-CM | POA: Diagnosis not present

## 2013-12-10 DIAGNOSIS — I10 Essential (primary) hypertension: Secondary | ICD-10-CM | POA: Diagnosis not present

## 2014-01-22 DIAGNOSIS — H26499 Other secondary cataract, unspecified eye: Secondary | ICD-10-CM | POA: Diagnosis not present

## 2014-01-22 DIAGNOSIS — H2510 Age-related nuclear cataract, unspecified eye: Secondary | ICD-10-CM | POA: Diagnosis not present

## 2014-03-06 DIAGNOSIS — H26499 Other secondary cataract, unspecified eye: Secondary | ICD-10-CM | POA: Diagnosis not present

## 2014-03-14 DIAGNOSIS — Z0389 Encounter for observation for other suspected diseases and conditions ruled out: Secondary | ICD-10-CM | POA: Diagnosis not present

## 2014-03-14 DIAGNOSIS — Z79899 Other long term (current) drug therapy: Secondary | ICD-10-CM | POA: Diagnosis not present

## 2014-03-19 DIAGNOSIS — Z111 Encounter for screening for respiratory tuberculosis: Secondary | ICD-10-CM | POA: Diagnosis not present

## 2014-04-24 DIAGNOSIS — M545 Low back pain, unspecified: Secondary | ICD-10-CM | POA: Diagnosis not present

## 2014-04-24 DIAGNOSIS — M069 Rheumatoid arthritis, unspecified: Secondary | ICD-10-CM | POA: Diagnosis not present

## 2014-04-24 DIAGNOSIS — L408 Other psoriasis: Secondary | ICD-10-CM | POA: Diagnosis not present

## 2014-04-24 DIAGNOSIS — M25569 Pain in unspecified knee: Secondary | ICD-10-CM | POA: Diagnosis not present

## 2014-04-30 DIAGNOSIS — N3941 Urge incontinence: Secondary | ICD-10-CM | POA: Diagnosis not present

## 2014-04-30 DIAGNOSIS — N952 Postmenopausal atrophic vaginitis: Secondary | ICD-10-CM | POA: Diagnosis not present

## 2014-04-30 DIAGNOSIS — Z23 Encounter for immunization: Secondary | ICD-10-CM | POA: Diagnosis not present

## 2014-04-30 DIAGNOSIS — I10 Essential (primary) hypertension: Secondary | ICD-10-CM | POA: Diagnosis not present

## 2014-05-23 ENCOUNTER — Ambulatory Visit (INDEPENDENT_AMBULATORY_CARE_PROVIDER_SITE_OTHER): Payer: Medicare Other | Admitting: Pulmonary Disease

## 2014-05-23 ENCOUNTER — Ambulatory Visit (INDEPENDENT_AMBULATORY_CARE_PROVIDER_SITE_OTHER)
Admission: RE | Admit: 2014-05-23 | Discharge: 2014-05-23 | Disposition: A | Discharge status: Home / Self Care | Payer: Medicare Other | Source: Ambulatory Visit | Attending: Pulmonary Disease | Admitting: Pulmonary Disease

## 2014-05-23 ENCOUNTER — Encounter: Payer: Self-pay | Admitting: Pulmonary Disease

## 2014-05-23 VITALS — BP 134/76 | HR 76 | Temp 97.0°F | Ht 69.0 in | Wt 212.8 lb

## 2014-05-23 DIAGNOSIS — J9611 Chronic respiratory failure with hypoxia: Secondary | ICD-10-CM

## 2014-05-23 DIAGNOSIS — J438 Other emphysema: Secondary | ICD-10-CM

## 2014-05-23 DIAGNOSIS — J449 Chronic obstructive pulmonary disease, unspecified: Secondary | ICD-10-CM | POA: Diagnosis not present

## 2014-05-23 DIAGNOSIS — J8410 Pulmonary fibrosis, unspecified: Secondary | ICD-10-CM

## 2014-05-23 NOTE — Assessment & Plan Note (Signed)
The patient has a history of mild to moderate airflow obstruction primarily manifested as air trapping. She is on Symbicort currently, and I would like to add a long-acting anticholinergic to her regimen to see if she sees a difference. I also have stressed to her the importance of aggressive weight loss and some type of conditioning program.

## 2014-05-23 NOTE — Patient Instructions (Signed)
Will add spiriva to your symbicort.  Take 2 inhalations each am everyday. Will [facility] an [facility] to [facility] to get you a portable oxygen concentrator. Can get AYR saline jelly over the counter.  Can use this for the crusting/sores in your nose. Will check a chest xray today, and call you with results. Work on weight loss and some type of activity to help with strengthening. followup with me again in 11mos, but let me know if the spiriva helps.

## 2014-05-23 NOTE — Assessment & Plan Note (Signed)
The patient has mild interstitial disease that is probably related to her rheumatoid arthritis. I suspect this is not the cause of her worsening shortness of breath, but we'll check a chest x-ray today for completeness. If it is found that her fibrosis is worsening, we will have to discuss with rheumatology changing her treatment regimen.

## 2014-05-23 NOTE — Progress Notes (Signed)
   Subjective:    Patient ID: [name], female    DOB: 09/24/1934, 79 y.o.   MRN: [id number]  HPI The patient comes in today for followup of her chronic respiratory failure, secondary to COPD, very mild interstitial disease probably related to her rheumatoid arthritis, as well as obesity and deconditioning. She feels that her breathing has worsened from the last visit, but she is not having any worsening chest congestion or cough. She does produce white foamy mucus on most mornings. She is staying on her current regimen for her rheumatoid arthritis, and has been compliant with her Symbicort. She admits that she has not been very active, and her weight is up 7 pounds from the last visit. She also has had worsening lower extremity edema, and I have asked her to discuss this with her primary physician.   Review of Systems  Constitutional: Negative for fever and unexpected weight change.  HENT: Positive for congestion. Negative for dental problem, ear pain, nosebleeds, postnasal drip, rhinorrhea, sinus pressure, sneezing, sore throat and trouble swallowing.   Eyes: Negative for redness and itching.  Respiratory: Positive for shortness of breath and wheezing. Negative for cough and chest tightness.   Cardiovascular: Negative for palpitations and leg swelling.  Gastrointestinal: Negative for nausea and vomiting.  Genitourinary: Negative for dysuria.  Musculoskeletal: Negative for joint swelling.  Skin: Negative for rash.  Neurological: Negative for headaches.  Hematological: Does not bruise/bleed easily.  Psychiatric/Behavioral: Negative for dysphoric mood. The patient is not nervous/anxious.        Objective:   Physical Exam Obese female in no acute distress Nose without purulence or discharge noted Neck without lymphadenopathy or thyromegaly Chest with decreased breath sounds, a few basilar crackles, no active wheezing Cardiac exam with regular rate and rhythm Lower extremities with  2+ edema, no cyanosis Alert and oriented, moves all 4 extremities.       Assessment & Plan:

## 2014-05-27 DIAGNOSIS — G894 Chronic pain syndrome: Secondary | ICD-10-CM | POA: Diagnosis not present

## 2014-05-27 DIAGNOSIS — M545 Low back pain: Secondary | ICD-10-CM | POA: Diagnosis not present

## 2014-05-27 DIAGNOSIS — Z79899 Other long term (current) drug therapy: Secondary | ICD-10-CM | POA: Diagnosis not present

## 2014-06-02 DIAGNOSIS — I10 Essential (primary) hypertension: Secondary | ICD-10-CM | POA: Diagnosis not present

## 2014-06-02 DIAGNOSIS — N183 Chronic kidney disease, stage 3 (moderate): Secondary | ICD-10-CM | POA: Diagnosis not present

## 2014-06-04 ENCOUNTER — Telehealth: Payer: Self-pay | Admitting: Pulmonary Disease

## 2014-06-04 MED ORDER — TIOTROPIUM BROMIDE MONOHYDRATE 2.5 MCG/ACT IN AERS: INHALATION_SPRAY | RESPIRATORY_TRACT | Status: DC

## 2014-06-04 NOTE — Telephone Encounter (Signed)
Per 05/23/14 OV w/ [facility]; Patient Instructions       Will add spiriva to your symbicort.  Take 2 inhalations each am everyday. Will [facility] an order to [facility] to get you a portable oxygen concentrator. Can get AYR saline jelly over the counter.  Can use this for the crusting/sores in your nose. Will check a chest xray today, and call you with results. Work on weight loss and some type of activity to help with strengthening. followup with me again in 41mos, but let me know if the spiriva helps.     Called spoke with pt. She reports the spiriva has improved her breathing and really likes this medication. Wants RX sent to [facility]. I have done so and will forward to [facility] as an [place]

## 2014-07-17 DIAGNOSIS — B354 Tinea corporis: Secondary | ICD-10-CM | POA: Diagnosis not present

## 2014-07-17 DIAGNOSIS — A4901 Methicillin susceptible Staphylococcus aureus infection, unspecified site: Secondary | ICD-10-CM | POA: Diagnosis not present

## 2014-07-17 DIAGNOSIS — L409 Psoriasis, unspecified: Secondary | ICD-10-CM | POA: Diagnosis not present

## 2014-08-06 DIAGNOSIS — M5136 Other intervertebral disc degeneration, lumbar region: Secondary | ICD-10-CM | POA: Diagnosis not present

## 2014-08-06 DIAGNOSIS — Z79899 Other long term (current) drug therapy: Secondary | ICD-10-CM | POA: Diagnosis not present

## 2014-08-06 DIAGNOSIS — J849 Interstitial pulmonary disease, unspecified: Secondary | ICD-10-CM | POA: Diagnosis not present

## 2014-08-06 DIAGNOSIS — M059 Rheumatoid arthritis with rheumatoid factor, unspecified: Secondary | ICD-10-CM | POA: Diagnosis not present

## 2014-10-06 DIAGNOSIS — M25561 Pain in right knee: Secondary | ICD-10-CM | POA: Diagnosis not present

## 2014-10-06 DIAGNOSIS — Z79899 Other long term (current) drug therapy: Secondary | ICD-10-CM | POA: Diagnosis not present

## 2014-10-06 DIAGNOSIS — M059 Rheumatoid arthritis with rheumatoid factor, unspecified: Secondary | ICD-10-CM | POA: Diagnosis not present

## 2014-10-06 DIAGNOSIS — M7531 Calcific tendinitis of right shoulder: Secondary | ICD-10-CM | POA: Diagnosis not present

## 2014-11-12 DIAGNOSIS — L409 Psoriasis, unspecified: Secondary | ICD-10-CM | POA: Diagnosis not present

## 2014-11-12 DIAGNOSIS — L811 Chloasma: Secondary | ICD-10-CM | POA: Diagnosis not present

## 2014-11-12 DIAGNOSIS — L299 Pruritus, unspecified: Secondary | ICD-10-CM | POA: Diagnosis not present

## 2014-11-12 DIAGNOSIS — L309 Dermatitis, unspecified: Secondary | ICD-10-CM | POA: Diagnosis not present

## 2014-11-26 ENCOUNTER — Ambulatory Visit (INDEPENDENT_AMBULATORY_CARE_PROVIDER_SITE_OTHER): Payer: Medicare Other | Admitting: Pulmonary Disease

## 2014-11-26 ENCOUNTER — Encounter: Payer: Self-pay | Admitting: Pulmonary Disease

## 2014-11-26 VITALS — BP 128/66 | HR 77 | Temp 98.0°F | Ht 69.0 in | Wt 206.2 lb

## 2014-11-26 DIAGNOSIS — J8410 Pulmonary fibrosis, unspecified: Secondary | ICD-10-CM | POA: Diagnosis not present

## 2014-11-26 DIAGNOSIS — J438 Other emphysema: Secondary | ICD-10-CM

## 2014-11-26 DIAGNOSIS — J9611 Chronic respiratory failure with hypoxia: Secondary | ICD-10-CM | POA: Diagnosis not present

## 2014-11-26 NOTE — Assessment & Plan Note (Signed)
The patient has a history of very mild interstitial lung disease, and this is probably secondary to her rheumatoid arthritis. She has a stable exam, and feels that her breathing is near her usual baseline. She has not wanted to have aggressive treatment of this, nor have a lot of testing done. I would probably check an x-ray at the next visit to compare to 2014.

## 2014-11-26 NOTE — Patient Instructions (Signed)
Will try you on spiriva respimat again, 2 inhalations each am. If this works for you, please call and will see if we can get you into the patient assistance program.  Stay on symbicort 2 puffs twice a day with spacer Will have our patient care coordinators check to see what is the issue with your portable concentrator.  Will have them call you.  followup again with [place] in 28mos.  Would like you to see Dr. Delton Coombes.

## 2014-11-26 NOTE — Assessment & Plan Note (Signed)
The patient has a history of mild COPD from prior PFTs, and has been maintained on Symbicort long-standing. Spiriva was added at her last visit, and she called back and commented that it had really helped her breathing. She is not using currently because of financial hardship. Will give her a one-month supply to try again to see if it helps her, and if it does, will fill out patient assistance paperwork.

## 2014-11-26 NOTE — Progress Notes (Signed)
   Subjective:    Patient ID: [name], female    DOB: 11-27-1934, 80 y.o.   MRN: [id number]  HPI The patient comes in today for follow-up of her known COPD, and very mild interstitial disease that is probably secondary to rheumatoid arthritis. She is staying on oxygen for her chronic respiratory failure, and is also staying on Symbicort. We tried her on Spiriva at the last visit and she called back and said she had a very good response. However, she has discontinued this, and when questioned further, she explains it is secondary to expense. I have told her that we can try to get her on the patient assistance program.  She feels her breathing is at baseline, but she is having some cough that sounds more consistent with postnasal drip. She is not having chest congestion.   Review of Systems  Constitutional: Negative for fever and unexpected weight change.  HENT: Positive for postnasal drip. Negative for dental problem, ear pain, nosebleeds, rhinorrhea, sinus pressure, sneezing, sore throat and trouble swallowing.   Eyes: Negative for redness and itching.  Respiratory: Positive for cough and shortness of breath. Negative for chest tightness and wheezing.   Cardiovascular: Positive for leg swelling. Negative for palpitations.  Gastrointestinal: Negative for nausea and vomiting.  Genitourinary: Negative for dysuria.  Musculoskeletal: Negative for joint swelling.  Skin: Negative for rash.  Neurological: Negative for headaches.  Hematological: Does not bruise/bleed easily.  Psychiatric/Behavioral: Negative for dysphoric mood. The patient is not nervous/anxious.        Objective:   Physical Exam Overweight female in no acute distress Nose without purulence or discharge noted Neck without lymphadenopathy or thyromegaly Chest with minimal basilar crackles, mildly decreased breath sounds, no wheezing Cardiac exam with regular rate and rhythm Lower extremities with 2+ edema, no  cyanosis Alert and oriented, moves all 4 extremities.       Assessment & Plan:

## 2014-11-28 ENCOUNTER — Ambulatory Visit: Payer: Medicare Other | Admitting: Pulmonary Disease

## 2015-01-12 DIAGNOSIS — M0570 Rheumatoid arthritis with rheumatoid factor of unspecified site without organ or systems involvement: Secondary | ICD-10-CM | POA: Diagnosis not present

## 2015-01-12 DIAGNOSIS — M545 Low back pain: Secondary | ICD-10-CM | POA: Diagnosis not present

## 2015-02-17 DIAGNOSIS — N183 Chronic kidney disease, stage 3 (moderate): Secondary | ICD-10-CM | POA: Diagnosis not present

## 2015-02-17 DIAGNOSIS — J849 Interstitial pulmonary disease, unspecified: Secondary | ICD-10-CM | POA: Diagnosis not present

## 2015-02-17 DIAGNOSIS — M069 Rheumatoid arthritis, unspecified: Secondary | ICD-10-CM | POA: Diagnosis not present

## 2015-02-17 DIAGNOSIS — J449 Chronic obstructive pulmonary disease, unspecified: Secondary | ICD-10-CM | POA: Diagnosis not present

## 2015-02-17 DIAGNOSIS — I10 Essential (primary) hypertension: Secondary | ICD-10-CM | POA: Diagnosis not present

## 2015-05-04 ENCOUNTER — Other Ambulatory Visit: Payer: Self-pay | Admitting: Rheumatology

## 2015-05-04 ENCOUNTER — Ambulatory Visit
Admission: RE | Admit: 2015-05-04 | Discharge: 2015-05-04 | Disposition: A | Discharge status: Home / Self Care | Payer: Medicare Other | Source: Ambulatory Visit | Attending: Rheumatology | Admitting: Rheumatology

## 2015-05-04 DIAGNOSIS — M7062 Trochanteric bursitis, left hip: Secondary | ICD-10-CM | POA: Diagnosis not present

## 2015-05-04 DIAGNOSIS — M25552 Pain in left hip: Secondary | ICD-10-CM

## 2015-05-04 DIAGNOSIS — M79652 Pain in left thigh: Secondary | ICD-10-CM

## 2015-05-04 DIAGNOSIS — M0570 Rheumatoid arthritis with rheumatoid factor of unspecified site without organ or systems involvement: Secondary | ICD-10-CM | POA: Diagnosis not present

## 2015-05-04 DIAGNOSIS — M1612 Unilateral primary osteoarthritis, left hip: Secondary | ICD-10-CM | POA: Diagnosis not present

## 2015-05-04 DIAGNOSIS — M25569 Pain in unspecified knee: Secondary | ICD-10-CM | POA: Diagnosis not present

## 2015-05-12 ENCOUNTER — Telehealth: Payer: Self-pay | Admitting: Emergency Medicine

## 2015-05-12 MED ORDER — PREDNISONE 5 MG PO TABS: 5.0000 mg | ORAL_TABLET | Freq: Every day | ORAL | Status: DC

## 2015-05-12 NOTE — Telephone Encounter (Signed)
Pt requesting refill on maintenance prednisone.  Pt following up with RB at the end of this month, will send under his name.  Nothing further needed.  

## 2015-05-13 DIAGNOSIS — M069 Rheumatoid arthritis, unspecified: Secondary | ICD-10-CM | POA: Diagnosis not present

## 2015-05-13 DIAGNOSIS — Z79899 Other long term (current) drug therapy: Secondary | ICD-10-CM | POA: Diagnosis not present

## 2015-05-29 ENCOUNTER — Ambulatory Visit: Payer: Medicare Other | Admitting: Emergency Medicine

## 2015-05-29 ENCOUNTER — Ambulatory Visit (INDEPENDENT_AMBULATORY_CARE_PROVIDER_SITE_OTHER): Payer: Medicare Other | Admitting: Emergency Medicine

## 2015-05-29 ENCOUNTER — Encounter: Payer: Self-pay | Admitting: Emergency Medicine

## 2015-05-29 ENCOUNTER — Ambulatory Visit (INDEPENDENT_AMBULATORY_CARE_PROVIDER_SITE_OTHER)
Admission: RE | Admit: 2015-05-29 | Discharge: 2015-05-29 | Disposition: A | Discharge status: Home / Self Care | Payer: Medicare Other | Source: Ambulatory Visit | Attending: Emergency Medicine | Admitting: Emergency Medicine

## 2015-05-29 VITALS — BP 122/80 | HR 97 | Wt 189.0 lb

## 2015-05-29 DIAGNOSIS — J849 Interstitial pulmonary disease, unspecified: Secondary | ICD-10-CM

## 2015-05-29 DIAGNOSIS — J438 Other emphysema: Secondary | ICD-10-CM

## 2015-05-29 DIAGNOSIS — Z23 Encounter for immunization: Secondary | ICD-10-CM | POA: Diagnosis not present

## 2015-05-29 DIAGNOSIS — J8410 Pulmonary fibrosis, unspecified: Secondary | ICD-10-CM

## 2015-05-29 DIAGNOSIS — J449 Chronic obstructive pulmonary disease, unspecified: Secondary | ICD-10-CM | POA: Diagnosis not present

## 2015-05-29 DIAGNOSIS — I10 Essential (primary) hypertension: Secondary | ICD-10-CM | POA: Diagnosis not present

## 2015-05-29 MED ORDER — TIOTROPIUM BROMIDE MONOHYDRATE 2.5 MCG/ACT IN AERS
INHALATION_SPRAY | RESPIRATORY_TRACT | Status: DC
Start: 2015-05-29 — End: 2016-09-16

## 2015-05-29 NOTE — Progress Notes (Signed)
Subjective:    Patient ID: [name], female    DOB: January 07, 1935, 80 y.o.   MRN: [id number]  HPI 80 year old former smoker with a history of rheumatoid arthritis who has been followed by Dr Shelle Iron for COPD and mild interstitial lung disease that has been presumed to be related to her autoimmune process. At her last visit she was restarted on Spiriva in addition to her maintenance Symbicort. She never took the Spiriva. In fact she has been off Symbicort and started using her husband's Pulmicort primarily due to cost. She is taking pred 5mg  qd, has been doing so for many years. Does not want to wean to off.    She is using 2 liters per minute. Her last chest x-ray was performed on 05/23/14 and I personally reviewed this. It shows stable changes consistent with hyperinflation without any obvious interstitial disease.  She is active, is able to shop as long as she has walker and her O2. She rarely hears wheeze, has minimal cough.    Review of Systems As per HPI  Past Medical History  Diagnosis Date  . Interstitial lung disease (HCC)   . Osteopenia   . Rheumatoid arthritis(714.0)   . Emphysema   . Hypertension   . Vertigo   . Pthirus inguinalis   . Psoriasis      Family History  Problem Relation Age of Onset  . Heart disease Sister   . Colon cancer Sister   . Asthma Sister      Social History   Social History  . Marital Status: Married    Spouse Name: N/A  . Number of Children: N/A  . Years of Education: N/A   Occupational History  . Not on file.   Social History Main Topics  . Smoking status: Former Smoker -- 0.50 packs/day for 50 years    Types: Cigarettes    Quit date: 08/01/2000  . Smokeless tobacco: Never Used  . Alcohol Use: No  . Drug Use: No  . Sexual Activity: Not on file   Other Topics Concern  . Not on file   Social History Narrative     No Known Allergies   Outpatient Prescriptions Prior to Visit  Medication Sig Dispense Refill  .  Adalimumab (HUMIRA PEN [place]) Inject 40 mg into the skin every 14 (fourteen) days. As directed    . aspirin 81 MG tablet Take 81 mg by mouth daily.     . budesonide-formoterol (SYMBICORT) 160-4.5 MCG/ACT inhaler Inhale 2 puffs into the lungs daily.     . calcium-vitamin D (OSCAL WITH D) 500-200 MG-UNIT per tablet Take 1 tablet by mouth daily.     . clobetasol cream (TEMOVATE) 0.05 % Apply 1 application topically daily as needed. For psoriasis    . desonide (DESOWEN) 0.05 % ointment Apply 1 application topically daily as needed. For psoriasis    . diclofenac sodium (VOLTAREN) 1 % GEL Apply 2 g topically See admin instructions. Once every three days as needed for rheumatoid arthritis    . folic acid (FOLVITE) 800 MCG tablet Take 800 mcg by mouth daily.     . hydrochlorothiazide (HYDRODIURIL) 25 MG tablet Take 1 tablet by mouth daily.    09/29/2000 HYDROcodone-acetaminophen (VICODIN) 5-500 MG per tablet Take 1 tablet by mouth daily.     . niacin 500 MG tablet Take 500 mg by mouth daily.     Marland Kitchen OVER THE COUNTER MEDICATION Apply 1 application topically daily. otc Psoriasis Control face,scalp, body lotion    .  predniSONE (DELTASONE) 5 MG tablet Take 1 tablet (5 mg total) by mouth daily. 30 tablet 0  . Tiotropium Bromide Monohydrate (SPIRIVA RESPIMAT) 2.5 MCG/ACT AERS 2 puffs once a day 4 g 6   No facility-administered medications prior to visit.       Objective:   Physical Exam  Filed Vitals:   05/29/15 1611  BP: 122/80  Pulse: 97  Weight: 189 lb (85.73 kg)  SpO2: 90%   Gen: Pleasant, elderly woman using walker , in no distress,  normal affect  ENT: No lesions,  mouth clear,  oropharynx clear, no postnasal drip  Neck: No JVD, no stridor  Lungs: No use of accessory muscles, distant, clear without rales or rhonchi  Cardiovascular: RRR, heart sounds normal, no murmur or gallops, trace peripheral edema  Musculoskeletal: No deformities, no cyanosis or clubbing  Neuro: alert, non focal  Skin: Warm,  no lesions or rashes     Assessment & Plan:  COPD (chronic obstructive pulmonary disease) Currently on no bronchodilator therapy. She never started Spiriva as instructed. She stopped her Symbicort and started using her husband's Pulmicort. remains on prednisone 5 mg daily and is resistant to discontinuation. She never got her portable oxygen concentrator.   PULMONARY FIBROSIS No clear indication for CT scan of the chest at this time. She hasn't had any significant interval change. I'll check a chest x-ray today. She has been maintained on prednisone 5 mg daily and we will continue this

## 2015-05-29 NOTE — Patient Instructions (Addendum)
We will stop Pulmicort  Start Spiriva Respimat 2 inhalations once a day.  Continue your prednisone 5mg  daily Stay on your oxygen at 2L/min CXR today.  Follow with Dr in 6 months or sooner if you have any problems

## 2015-05-29 NOTE — Assessment & Plan Note (Addendum)
Currently on no bronchodilator therapy. She never started Spiriva as instructed. She stopped her Symbicort and started using her husband's Pulmicort. remains on prednisone 5 mg daily and is resistant to discontinuation. She never got her portable oxygen concentrator.

## 2015-05-29 NOTE — Assessment & Plan Note (Addendum)
No clear indication for CT scan of the chest at this time. She hasn't had any significant interval change. I'll check a chest x-ray today. She has been maintained on prednisone 5 mg daily and we will continue this

## 2015-06-12 ENCOUNTER — Other Ambulatory Visit: Payer: Self-pay | Admitting: Emergency Medicine

## 2015-07-03 DIAGNOSIS — M1712 Unilateral primary osteoarthritis, left knee: Secondary | ICD-10-CM | POA: Diagnosis not present

## 2015-07-03 DIAGNOSIS — M1612 Unilateral primary osteoarthritis, left hip: Secondary | ICD-10-CM | POA: Diagnosis not present

## 2015-07-13 ENCOUNTER — Other Ambulatory Visit: Payer: Self-pay | Admitting: Emergency Medicine

## 2015-08-11 ENCOUNTER — Other Ambulatory Visit: Payer: Self-pay | Admitting: Emergency Medicine

## 2015-08-20 DIAGNOSIS — H25812 Combined forms of age-related cataract, left eye: Secondary | ICD-10-CM | POA: Diagnosis not present

## 2015-08-20 DIAGNOSIS — H524 Presbyopia: Secondary | ICD-10-CM | POA: Diagnosis not present

## 2015-09-07 DIAGNOSIS — Z79899 Other long term (current) drug therapy: Secondary | ICD-10-CM | POA: Diagnosis not present

## 2015-09-07 DIAGNOSIS — J8403 Idiopathic pulmonary hemosiderosis: Secondary | ICD-10-CM | POA: Diagnosis not present

## 2015-09-07 DIAGNOSIS — M545 Low back pain: Secondary | ICD-10-CM | POA: Diagnosis not present

## 2015-09-07 DIAGNOSIS — M7532 Calcific tendinitis of left shoulder: Secondary | ICD-10-CM | POA: Diagnosis not present

## 2015-09-07 DIAGNOSIS — M0570 Rheumatoid arthritis with rheumatoid factor of unspecified site without organ or systems involvement: Secondary | ICD-10-CM | POA: Diagnosis not present

## 2015-09-07 DIAGNOSIS — M7062 Trochanteric bursitis, left hip: Secondary | ICD-10-CM | POA: Diagnosis not present

## 2015-10-27 ENCOUNTER — Emergency Department (HOSPITAL_COMMUNITY)
Admission: EM | Admit: 2015-10-27 | Discharge: 2015-10-27 | Disposition: A | Discharge status: Home / Self Care | Payer: Commercial Managed Care - HMO | Attending: Emergency Medicine | Admitting: Emergency Medicine

## 2015-10-27 ENCOUNTER — Encounter (HOSPITAL_COMMUNITY): Payer: Self-pay

## 2015-10-27 DIAGNOSIS — N390 Urinary tract infection, site not specified: Secondary | ICD-10-CM

## 2015-10-27 DIAGNOSIS — Z87891 Personal history of nicotine dependence: Secondary | ICD-10-CM | POA: Diagnosis not present

## 2015-10-27 DIAGNOSIS — I10 Essential (primary) hypertension: Secondary | ICD-10-CM | POA: Insufficient documentation

## 2015-10-27 DIAGNOSIS — Z79899 Other long term (current) drug therapy: Secondary | ICD-10-CM | POA: Diagnosis not present

## 2015-10-27 DIAGNOSIS — Z7952 Long term (current) use of systemic steroids: Secondary | ICD-10-CM | POA: Diagnosis not present

## 2015-10-27 DIAGNOSIS — Z8619 Personal history of other infectious and parasitic diseases: Secondary | ICD-10-CM | POA: Diagnosis not present

## 2015-10-27 DIAGNOSIS — Z9981 Dependence on supplemental oxygen: Secondary | ICD-10-CM | POA: Diagnosis not present

## 2015-10-27 DIAGNOSIS — Z7951 Long term (current) use of inhaled steroids: Secondary | ICD-10-CM | POA: Insufficient documentation

## 2015-10-27 DIAGNOSIS — M069 Rheumatoid arthritis, unspecified: Secondary | ICD-10-CM | POA: Diagnosis not present

## 2015-10-27 DIAGNOSIS — Z872 Personal history of diseases of the skin and subcutaneous tissue: Secondary | ICD-10-CM | POA: Diagnosis not present

## 2015-10-27 DIAGNOSIS — J439 Emphysema, unspecified: Secondary | ICD-10-CM | POA: Insufficient documentation

## 2015-10-27 DIAGNOSIS — M8580 Other specified disorders of bone density and structure, unspecified site: Secondary | ICD-10-CM | POA: Insufficient documentation

## 2015-10-27 DIAGNOSIS — Z7982 Long term (current) use of aspirin: Secondary | ICD-10-CM | POA: Diagnosis not present

## 2015-10-27 DIAGNOSIS — R300 Dysuria: Secondary | ICD-10-CM | POA: Diagnosis present

## 2015-10-27 HISTORY — DX: Dependence on supplemental oxygen: Z99.81

## 2015-10-27 LAB — CBC WITH DIFFERENTIAL/PLATELET
Basophils Absolute: 0 10*3/uL (ref 0.0–0.1)
Basophils Relative: 0 %
EOS PCT: 6 %
Eosinophils Absolute: 0.3 10*3/uL (ref 0.0–0.7)
HCT: 40.5 % (ref 36.0–46.0)
Hemoglobin: 12.5 g/dL (ref 12.0–15.0)
LYMPHS ABS: 1.8 10*3/uL (ref 0.7–4.0)
LYMPHS PCT: 34 %
MCH: 24.7 pg — AB (ref 26.0–34.0)
MCHC: 30.9 g/dL (ref 30.0–36.0)
MCV: 80 fL (ref 78.0–100.0)
MONOS PCT: 10 %
Monocytes Absolute: 0.5 10*3/uL (ref 0.1–1.0)
Neutro Abs: 2.5 10*3/uL (ref 1.7–7.7)
Neutrophils Relative %: 50 %
PLATELETS: 235 10*3/uL (ref 150–400)
RBC: 5.06 MIL/uL (ref 3.87–5.11)
RDW: 15.9 % — ABNORMAL HIGH (ref 11.5–15.5)
WBC: 5.1 10*3/uL (ref 4.0–10.5)

## 2015-10-27 LAB — URINE MICROSCOPIC-ADD ON

## 2015-10-27 LAB — BASIC METABOLIC PANEL
ANION GAP: 7 (ref 5–15)
BUN: 17 mg/dL (ref 6–20)
CALCIUM: 9.3 mg/dL (ref 8.9–10.3)
CO2: 24 mmol/L (ref 22–32)
Chloride: 109 mmol/L (ref 101–111)
Creatinine, Ser: 1.35 mg/dL — ABNORMAL HIGH (ref 0.44–1.00)
GFR calc Af Amer: 42 mL/min — ABNORMAL LOW (ref >60)
GFR calc non Af Amer: 37 mL/min — ABNORMAL LOW (ref >60)
GLUCOSE: 117 mg/dL — AB (ref 65–99)
POTASSIUM: 4.2 mmol/L (ref 3.5–5.1)
Sodium: 140 mmol/L (ref 135–145)

## 2015-10-27 LAB — URINALYSIS, ROUTINE W REFLEX MICROSCOPIC
Glucose, UA: NEGATIVE mg/dL
Ketones, ur: NEGATIVE mg/dL
Nitrite: NEGATIVE
PH: 6 (ref 5.0–8.0)
SPECIFIC GRAVITY, URINE: 1.026 (ref 1.005–1.030)

## 2015-10-27 LAB — LACTIC ACID, PLASMA: Lactic Acid, Venous: 1.6 mmol/L (ref 0.5–2.0)

## 2015-10-27 MED ORDER — CEFTRIAXONE SODIUM 1 G IJ SOLR
1.0000 g | Freq: Once | INTRAMUSCULAR | Status: AC
  Administered 2015-10-27: 1 g via INTRAMUSCULAR
  Filled 2015-10-27: qty 10

## 2015-10-27 MED ORDER — CEPHALEXIN 500 MG PO CAPS: 500.0000 mg | ORAL_CAPSULE | Freq: Four times a day (QID) | ORAL | Status: DC

## 2015-10-27 MED ORDER — LIDOCAINE HCL (PF) 1 % IJ SOLN
INTRAMUSCULAR | Status: AC
  Administered 2015-10-27: 5 mL
  Filled 2015-10-27: qty 5

## 2015-10-27 NOTE — ED Provider Notes (Signed)
CSN: [id number]     Arrival date & time 10/27/15  1436 History   First MD Initiated Contact with Patient 10/27/15 1842     Chief Complaint  Patient presents with  . Back Pain  . Dysuria      HPI  Pt was seen at 1905. Per pt, c/o gradual onset and persistence of constant dysuria, urinary frequency and urgency for the past 4 days. Has been associated with lower back "pain."  Denies flank pain, no fevers, no abd pain, no N/V/D, no vaginal bleeding/discharge, no rash.    Past Medical History  Diagnosis Date  . Interstitial lung disease (HCC)   . Osteopenia   . Rheumatoid arthritis(714.0)   . Emphysema   . Hypertension   . Vertigo   . Pthirus inguinalis   . Psoriasis   . On home O2     2L N/C   Past Surgical History  Procedure Laterality Date  . Vesicovaginal fistula closure w/ tah  1962  . Cholecystectomy     Family History  Problem Relation Age of Onset  . Heart disease Sister   . Colon cancer Sister   . Asthma Sister    Social History  Substance Use Topics  . Smoking status: Former Smoker -- 0.50 packs/day for 50 years    Types: Cigarettes    Quit date: 08/01/2000  . Smokeless tobacco: Never Used  . Alcohol Use: No    Review of Systems ROS: Statement: All systems negative except as marked or noted in the HPI; Constitutional: Negative for fever and chills. ; ; Eyes: Negative for eye pain, redness and discharge. ; ; ENMT: Negative for ear pain, hoarseness, nasal congestion, sinus pressure and sore throat. ; ; Cardiovascular: Negative for chest pain, palpitations, diaphoresis, dyspnea and peripheral edema. ; ; Respiratory: Negative for cough, wheezing and stridor. ; ; Gastrointestinal: Negative for nausea, vomiting, diarrhea, abdominal pain, blood in stool, hematemesis, jaundice and rectal bleeding. . ; ; Genitourinary: +dysuria, urinary frequency, urinary urgency. Negative for flank pain and hematuria. ; ; Musculoskeletal: +LBP. Negative for neck pain. Negative for  swelling and trauma.; ; Skin: Negative for pruritus, rash, abrasions, blisters, bruising and skin lesion.; ; Neuro: Negative for headache, lightheadedness and neck stiffness. Negative for weakness, altered level of consciousness , altered mental status, extremity weakness, paresthesias, involuntary movement, seizure and syncope.      Allergies  Review of patient's allergies indicates no known allergies.  Home Medications   Prior to Admission medications   Medication Sig Start Date End Date Taking? Authorizing Provider  amLODipine (NORVASC) 5 MG tablet Take 5 mg by mouth daily.   Yes Historical Provider, MD  aspirin 81 MG tablet Take 81 mg by mouth daily.    Yes Historical Provider, MD  budesonide-formoterol (SYMBICORT) 160-4.5 MCG/ACT inhaler Inhale 2 puffs into the lungs 2 (two) times daily.    Yes Historical Provider, MD  folic acid (FOLVITE) 800 MCG tablet Take 800 mcg by mouth daily.    Yes Historical Provider, MD  Multiple Vitamins-Minerals (MULTIVITAMIN & MINERAL PO) Take 1 tablet by mouth daily.   Yes Historical Provider, MD  niacin 500 MG tablet Take 500 mg by mouth daily.    Yes Historical Provider, MD  predniSONE (DELTASONE) 5 MG tablet TAKE ONE TABLET BY MOUTH ONCE DAILY 08/11/15  Yes Leslye Peer, MD  Adalimumab (HUMIRA PEN [place]) Inject 40 mg into the skin every 14 (fourteen) days. As directed    Historical Provider, MD  calcium-vitamin D (  OSCAL WITH D) 500-200 MG-UNIT per tablet Take 1 tablet by mouth daily.     Historical Provider, MD  clobetasol cream (TEMOVATE) 0.05 % Apply 1 application topically daily as needed. For psoriasis    Historical Provider, MD  desonide (DESOWEN) 0.05 % ointment Apply 1 application topically daily as needed. For psoriasis    Historical Provider, MD  diclofenac sodium (VOLTAREN) 1 % GEL Apply 2 g topically See admin instructions. Once every three days as needed for rheumatoid arthritis    Historical Provider, MD  hydrochlorothiazide (HYDRODIURIL) 25 MG  tablet Take 1 tablet by mouth daily. 08/12/11   Historical Provider, MD  HYDROcodone-acetaminophen (VICODIN) 5-500 MG per tablet Take 1 tablet by mouth daily.     Historical Provider, MD  OVER THE COUNTER MEDICATION Apply 1 application topically daily. otc Psoriasis Control face,scalp, body lotion    Historical Provider, MD  Tiotropium Bromide Monohydrate (SPIRIVA RESPIMAT) 2.5 MCG/ACT AERS 2 puffs once a day Patient not taking: Reported on 10/27/2015 05/29/15 05/29/16  Leslye Peer, MD   BP 125/85 mmHg  Pulse 102  Temp(Src) 97.4 F (36.3 C) (Oral)  Resp 18  SpO2 96% Physical Exam  1910: Physical examination:  Nursing notes reviewed; Vital signs and O2 SAT reviewed;  Constitutional: Well developed, Well nourished, Well hydrated, In no acute distress; Head:  Normocephalic, atraumatic; Eyes: EOMI, PERRL, No scleral icterus; ENMT: Mouth and pharynx normal, Mucous membranes moist; Neck: Supple, Full range of motion, No lymphadenopathy; Cardiovascular: Regular rate and rhythm, No gallop; Respiratory: Breath sounds clear & equal bilaterally, No wheezes.  Speaking full sentences with ease, Normal respiratory effort/excursion; Chest: Nontender, Movement normal; Abdomen: Soft, +mild suprapubic tenderness to palp. No rebound or guarding. Nondistended, Normal bowel sounds; Genitourinary: No CVA tenderness; Spine:  No midline CS, TS, LS tenderness.; Extremities: Pulses normal, No tenderness, No edema, No calf edema or asymmetry.; Neuro: AA&Ox3, Major CN grossly intact.  Speech clear. No gross focal motor or sensory deficits in extremities.; Skin: Color normal, Warm, Dry.    ED Course  Procedures (including critical care time) Labs Review  Imaging Review  I have personally reviewed and evaluated these images and lab results as part of my medical decision-making.   EKG Interpretation None      MDM  MDM Reviewed: previous chart, nursing note and vitals Reviewed previous: labs Interpretation:  labs      Results for orders placed or performed during the hospital encounter of 10/27/15  Urinalysis, Routine w reflex microscopic-may I&O cath if menses (not at [facility])  Result Value Ref Range   Color, Urine AMBER (A) YELLOW   APPearance CLOUDY (A) CLEAR   Specific Gravity, Urine 1.026 1.005 - 1.030   pH 6.0 5.0 - 8.0   Glucose, UA NEGATIVE NEGATIVE mg/dL   Hgb urine dipstick TRACE (A) NEGATIVE   Bilirubin Urine SMALL (A) NEGATIVE   Ketones, ur NEGATIVE NEGATIVE mg/dL   Protein, ur >683 (A) NEGATIVE mg/dL   Nitrite NEGATIVE NEGATIVE   Leukocytes, UA SMALL (A) NEGATIVE  Urine microscopic-add on  Result Value Ref Range   Squamous Epithelial / LPF 6-30 (A) NONE SEEN   WBC, UA 6-30 0 - 5 WBC/hpf   RBC / HPF 0-5 0 - 5 RBC/hpf   Bacteria, UA FEW (A) NONE SEEN   Casts HYALINE CASTS (A) NEGATIVE  CBC with Differential  Result Value Ref Range   WBC 5.1 4.0 - 10.5 K/uL   RBC 5.06 3.87 - 5.11 MIL/uL   Hemoglobin 12.5  12.0 - 15.0 g/dL   HCT 01.0 93.2 - 35.5 %   MCV 80.0 78.0 - 100.0 fL   MCH 24.7 (L) 26.0 - 34.0 pg   MCHC 30.9 30.0 - 36.0 g/dL   RDW 73.2 (H) 20.2 - 54.2 %   Platelets 235 150 - 400 K/uL   Neutrophils Relative % 50 %   Neutro Abs 2.5 1.7 - 7.7 K/uL   Lymphocytes Relative 34 %   Lymphs Abs 1.8 0.7 - 4.0 K/uL   Monocytes Relative 10 %   Monocytes Absolute 0.5 0.1 - 1.0 K/uL   Eosinophils Relative 6 %   Eosinophils Absolute 0.3 0.0 - 0.7 K/uL   Basophils Relative 0 %   Basophils Absolute 0.0 0.0 - 0.1 K/uL  Lactic acid, plasma  Result Value Ref Range   Lactic Acid, Venous 1.6 0.5 - 2.0 mmol/L  Basic metabolic panel  Result Value Ref Range   Sodium 140 135 - 145 mmol/L   Potassium 4.2 3.5 - 5.1 mmol/L   Chloride 109 101 - 111 mmol/L   CO2 24 22 - 32 mmol/L   Glucose, Bld 117 (H) 65 - 99 mg/dL   BUN 17 6 - 20 mg/dL   Creatinine, Ser 7.06 (H) 0.44 - 1.00 mg/dL   Calcium 9.3 8.9 - 23.7 mg/dL   GFR calc non Af Amer 37 (L) >60 mL/min   GFR calc Af Amer 42 (L)  >60 mL/min   Anion gap 7 5 - 15    2110:  Pt refuses orthostatic VS. Pt has gotten herself dressed and is walking in the hallway demanding to be discharged. VS remain stable. BUN/Cr per baseline. +UTI, UC pending; IM rocephin, PO keflex. Dx and testing d/w pt and family.  Questions answered.  Verb understanding, agreeable to d/c home with outpt f/u.     Samuel Jester, DO 10/30/15 1555

## 2015-10-27 NOTE — ED Notes (Signed)
Pt with back pain and dysuria since Friday.  Difficulty urinating.  No fever.  No abdominal pain.  No n/v

## 2015-10-27 NOTE — Discharge Instructions (Signed)
Take the prescription as directed.  Call your regular medical doctor tomorrow to schedule a follow up appointment within the next 2 days.  Return to the Emergency Department immediately sooner if worsening.  ° °

## 2015-10-27 NOTE — ED Notes (Signed)
Pt refused to let the NT take orthostatic vitals.  Pt stated that she "wasn't taking anymore tests, and that she wanted to go home".

## 2015-10-29 LAB — URINE CULTURE

## 2015-11-03 DIAGNOSIS — G4700 Insomnia, unspecified: Secondary | ICD-10-CM | POA: Diagnosis not present

## 2015-11-03 DIAGNOSIS — R1310 Dysphagia, unspecified: Secondary | ICD-10-CM | POA: Diagnosis not present

## 2015-11-03 DIAGNOSIS — I10 Essential (primary) hypertension: Secondary | ICD-10-CM | POA: Diagnosis not present

## 2015-11-09 DIAGNOSIS — N183 Chronic kidney disease, stage 3 (moderate): Secondary | ICD-10-CM | POA: Diagnosis not present

## 2015-11-09 DIAGNOSIS — N390 Urinary tract infection, site not specified: Secondary | ICD-10-CM | POA: Diagnosis not present

## 2015-11-09 DIAGNOSIS — I10 Essential (primary) hypertension: Secondary | ICD-10-CM | POA: Diagnosis not present

## 2015-11-27 IMAGING — CR DG CHEST 2V
2 series · 2 of 2 positions shown · non-contrast
Comparison: 03/26/2013

CLINICAL DATA: emphysema, chronic respiratory failure with hypoxia,
pulmonary fibrosis

EXAM:
CHEST  2 VIEW

[view not recorded (1 of 2)]
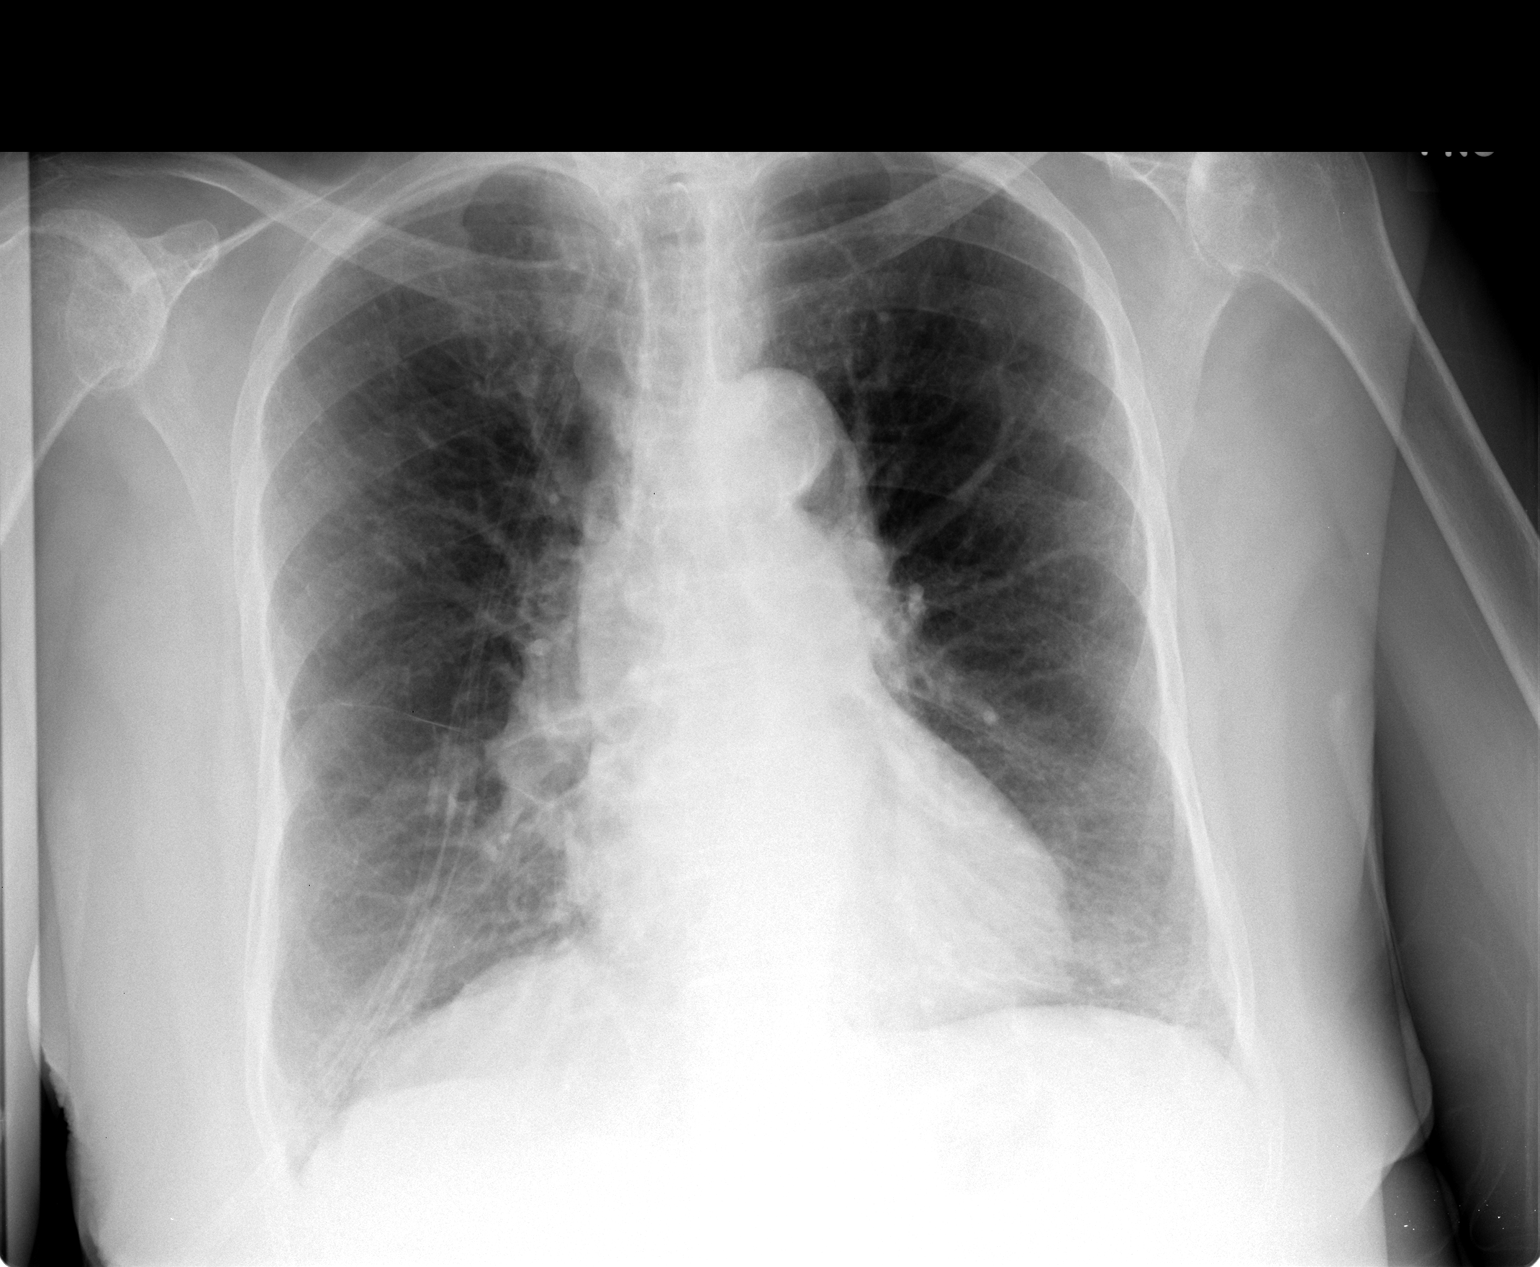

[view not recorded (2 of 2)]
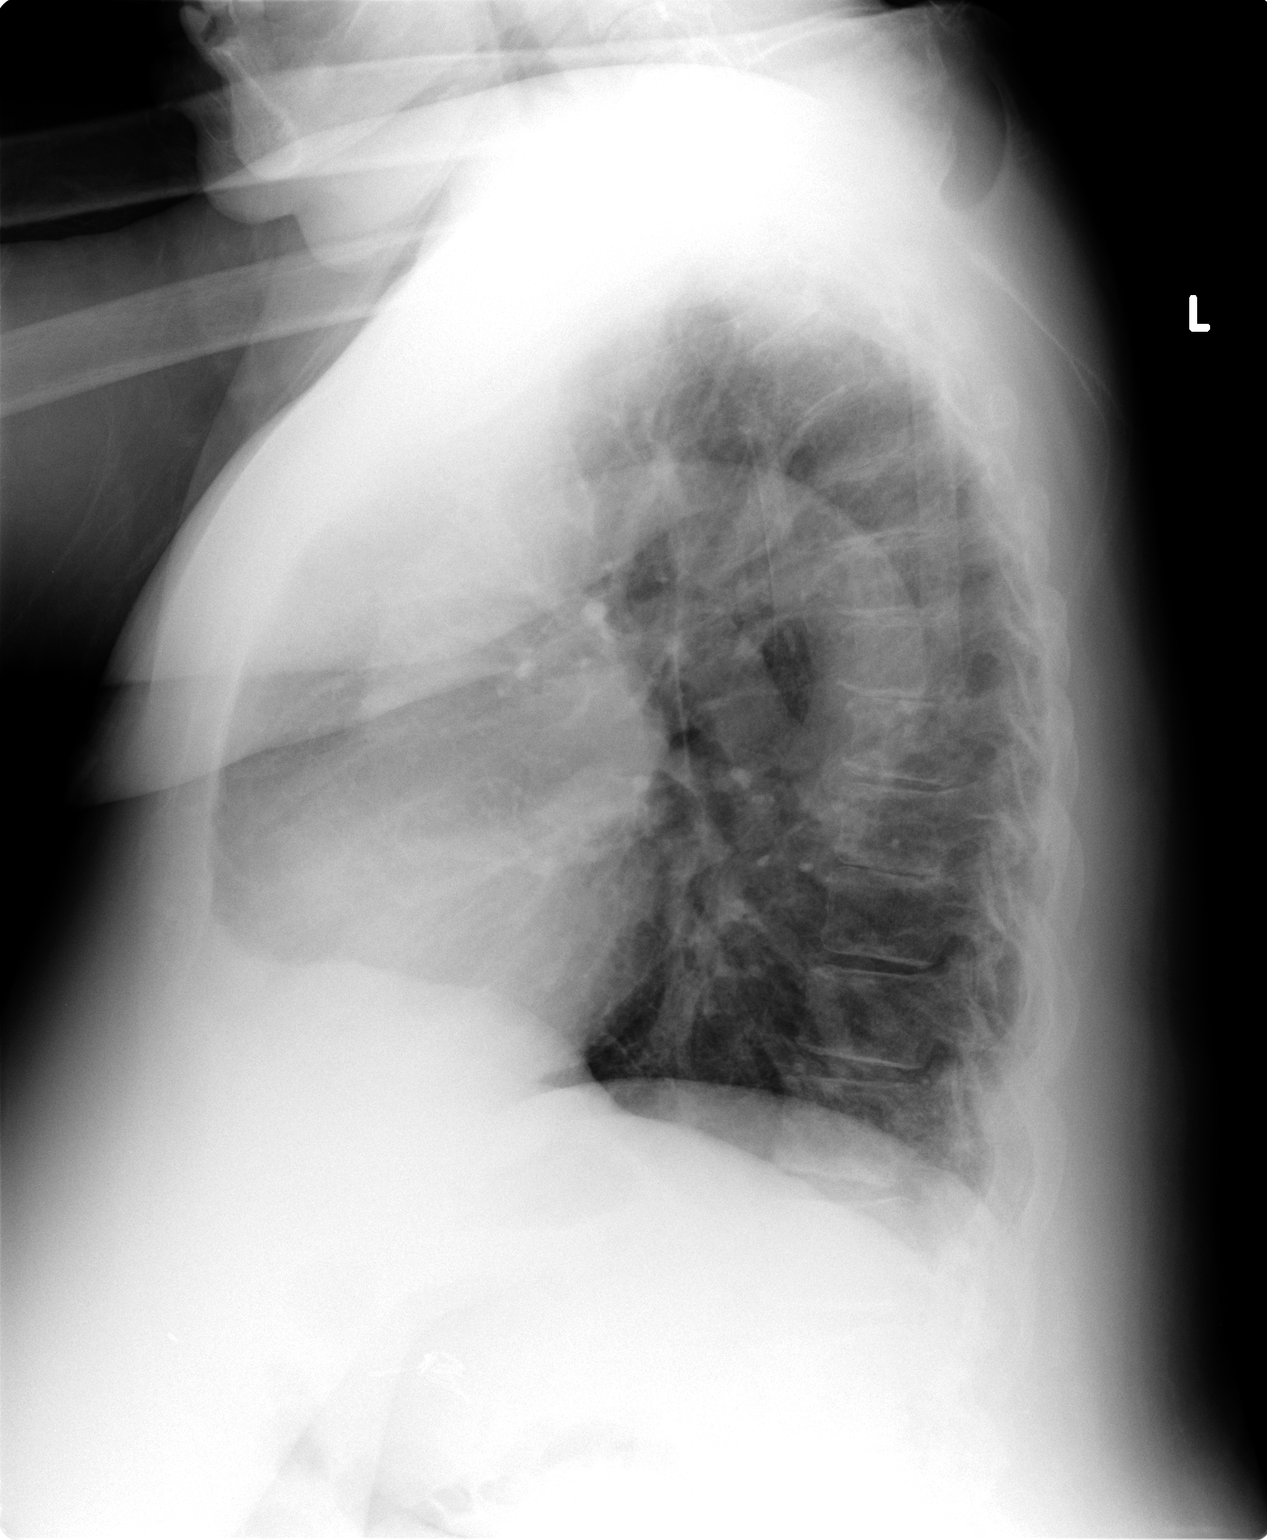

[2 of 2 positions shown; findings below may reference images not displayed]

FINDINGS: Cardiomediastinal silhouette is stable. No acute infiltrate or
pleural effusion. No pulmonary edema. Mild degenerative changes
thoracic spine. Stable hyperinflation and chronic mild interstitial
prominence.
IMPRESSION: Stable COPD.  No active disease.

## 2015-12-03 ENCOUNTER — Ambulatory Visit (INDEPENDENT_AMBULATORY_CARE_PROVIDER_SITE_OTHER): Payer: Commercial Managed Care - HMO | Admitting: Emergency Medicine

## 2015-12-03 ENCOUNTER — Encounter: Payer: Self-pay | Admitting: Emergency Medicine

## 2015-12-03 VITALS — BP 102/70 | HR 89 | Wt 189.0 lb

## 2015-12-03 DIAGNOSIS — J438 Other emphysema: Secondary | ICD-10-CM | POA: Diagnosis not present

## 2015-12-03 DIAGNOSIS — J9611 Chronic respiratory failure with hypoxia: Secondary | ICD-10-CM

## 2015-12-03 DIAGNOSIS — J8410 Pulmonary fibrosis, unspecified: Secondary | ICD-10-CM

## 2015-12-03 NOTE — Progress Notes (Signed)
Subjective:    Patient ID: [name], female    DOB: January 11, 1935, 81 y.o.   MRN: [id number]  HPI 81 year old former smoker with a history of rheumatoid arthritis who has been followed by Dr Gwenette Greet for COPD and mild interstitial lung disease that has been presumed to be related to her autoimmune process. At her last visit she was restarted on Spiriva in addition to her maintenance Symbicort. She never took the Spiriva. In fact she has been off Symbicort and started using her husband's Pulmicort primarily due to cost. She is taking pred 3m qd, has been doing so for many years. Does not want to wean to off.    She is using 2 liters per minute. Her last chest x-ray was performed on 05/23/14 and I personally reviewed this. It shows stable changes consistent with hyperinflation without any obvious interstitial disease.  She is active, is able to shop as long as she has walker and her O2. She rarely hears wheeze, has minimal cough.   ROV 12/03/15 -- patient with a history of interstitial lung disease associated with her arthritis as well as COPD, documented associated hypoxemia. She had been written for Spiriva and Symbicort but was not on that regimen when I met her last visit. She instead has been using her husband's Pulmicort intermittently. At her last visit we started Spiriva. She is unsure whether it  Helps much. She is on prednisone 5 g daily, Humira. Chest x-ray at her last visit in October 2016 showed moderately severe interstitial disease without any significant interval change. She has daily cough, prod of clear phlegm. Her O2 is on 2L/min, wears consistently.    Review of Systems As per HPI  Past Medical History  Diagnosis Date  . [place] lung disease [place]   . Osteopenia   . Rheumatoid arthritis(714.0)   . Emphysema   . Hypertension   . Vertigo   . Pthirus inguinalis   . Psoriasis   . On home O2     2L N/C     Family History  Problem Relation Age of Onset  . Heart  disease Sister   . Colon cancer Sister   . Asthma Sister      Social History   Social History  . Marital Status: Married    Spouse Name: N/A  . Number of Children: N/A  . Years of Education: N/A   Occupational History  . Not on file.   Social History Main Topics  . Smoking status: Former Smoker -- 0.50 packs/day for 50 years    Types: Cigarettes    Quit date: 08/01/2000  . Smokeless tobacco: Never Used  . Alcohol Use: No  . Drug Use: No  . Sexual Activity: Not on file   Other Topics Concern  . Not on file   Social History Narrative     No Known Allergies   Outpatient Prescriptions Prior to Visit  Medication Sig Dispense Refill  . Adalimumab (HUMIRA PEN [place]) Inject 40 mg into the skin every 14 (fourteen) days. As directed    . amLODipine (NORVASC) 10 MG tablet Take 10 mg by mouth daily.     .Marland Kitchenaspirin 81 MG tablet Take 81 mg by mouth daily.     . budesonide-formoterol (SYMBICORT) 160-4.5 MCG/ACT inhaler Inhale 2 puffs into the lungs 2 (two) times daily.     . clobetasol cream (TEMOVATE) 05.39% Apply 1 application topically daily as needed. For psoriasis    . desonide (DESOWEN) 0.05 %  ointment Apply 1 application topically daily as needed. For psoriasis    . diclofenac sodium (VOLTAREN) 1 % GEL Apply 2 g topically See admin instructions. Once every three days as needed for rheumatoid arthritis    . folic acid (FOLVITE) 433 MCG tablet Take 800 mcg by mouth daily.     . Multiple Vitamins-Minerals (MULTIVITAMIN & MINERAL PO) Take 1 tablet by mouth daily.    . niacin 500 MG tablet Take 500 mg by mouth daily.     Marland Kitchen OVER THE COUNTER MEDICATION Apply 1 application topically daily. otc Psoriasis Control face,scalp, body lotion    . predniSONE (DELTASONE) 5 MG tablet TAKE ONE TABLET BY MOUTH ONCE DAILY 30 tablet 2  . Tiotropium Bromide Monohydrate (SPIRIVA RESPIMAT) 2.5 MCG/ACT AERS 2 puffs once a day 2 Inhaler 0  . traMADol (ULTRAM) 50 MG tablet Take 50 mg by mouth every 6 (six)  hours as needed for severe pain.     . cephALEXin (KEFLEX) 500 MG capsule Take 1 capsule (500 mg total) by mouth 4 (four) times daily. 40 capsule 0   No facility-administered medications prior to visit.       Objective:   Physical Exam  Filed Vitals:   12/03/15 1332  BP: 102/70  Pulse: 89  Weight: 189 lb (85.73 kg)  SpO2: 91%   Gen: Pleasant, elderly woman using walker , in no distress,  normal affect  ENT: No lesions,  mouth clear,  oropharynx clear, no postnasal drip  Neck: No JVD, no stridor  Lungs: No use of accessory muscles, distant, bilateral basilar inspiratory crackles  Cardiovascular: RRR, heart sounds normal, no murmur or gallops, trace peripheral edema  Musculoskeletal: No deformities, no cyanosis or clubbing  Neuro: alert, non focal  Skin: Warm, no lesions or rashes     Assessment & Plan:  COPD (chronic obstructive pulmonary disease) She started Spiriva as planned. She is not entirely sure whether she's benefited from this medication. I asked her if she would like to continue when she said that she would. We will do so for now and then reassess her symptoms as we go forward.  RESPIRATORY FAILURE, CHRONIC Continue oxygen at 2 L/m  PULMONARY FIBROSIS Mild interstitial disease with crackles on exam. Her chest x-ray confirmed stability in October. Suspect that this is due to her rheumatoid disease although she is on Humira and must consider this as a potential contributor. She is worried to minimize testing and for this reason will defer a CT scan of her chest at this time. Follow her clinically and radiographically while she stays on the Humira. She is on prednisone 5 mg daily and is reticent to come off of this

## 2015-12-03 NOTE — Patient Instructions (Signed)
Please continue your medications as you have ben taking them Please continue your oxygen at 2L/min.  We will discuss next visit the timing of a repeat CT scan of your chest.  Follow with Dr Delton Coombes in 12 months or sooner if you have any problems

## 2015-12-03 NOTE — Assessment & Plan Note (Signed)
Mild interstitial disease with crackles on exam. Her chest x-ray confirmed stability in October. Suspect that this is due to her rheumatoid disease although she is on Humira and must consider this as a potential contributor. She is worried to minimize testing and for this reason will defer a CT scan of her chest at this time. Follow her clinically and radiographically while she stays on the Humira. She is on prednisone 5 mg daily and is reticent to come off of this

## 2015-12-03 NOTE — Assessment & Plan Note (Signed)
She started Spiriva as planned. She is not entirely sure whether she's benefited from this medication. I asked her if she would like to continue when she said that she would. We will do so for now and then reassess her symptoms as we go forward.

## 2015-12-03 NOTE — Assessment & Plan Note (Signed)
Continue oxygen at 2 L/m

## 2015-12-08 DIAGNOSIS — Z0100 Encounter for examination of eyes and vision without abnormal findings: Secondary | ICD-10-CM | POA: Diagnosis not present

## 2015-12-08 DIAGNOSIS — H5213 Myopia, bilateral: Secondary | ICD-10-CM | POA: Diagnosis not present

## 2015-12-08 DIAGNOSIS — H52209 Unspecified astigmatism, unspecified eye: Secondary | ICD-10-CM | POA: Diagnosis not present

## 2015-12-08 DIAGNOSIS — H524 Presbyopia: Secondary | ICD-10-CM | POA: Diagnosis not present

## 2015-12-08 DIAGNOSIS — H5203 Hypermetropia, bilateral: Secondary | ICD-10-CM | POA: Diagnosis not present

## 2016-01-07 DIAGNOSIS — M25475 Effusion, left foot: Secondary | ICD-10-CM | POA: Diagnosis not present

## 2016-01-07 DIAGNOSIS — M25511 Pain in right shoulder: Secondary | ICD-10-CM | POA: Diagnosis not present

## 2016-01-07 DIAGNOSIS — M25512 Pain in left shoulder: Secondary | ICD-10-CM | POA: Diagnosis not present

## 2016-01-07 DIAGNOSIS — M0570 Rheumatoid arthritis with rheumatoid factor of unspecified site without organ or systems involvement: Secondary | ICD-10-CM | POA: Diagnosis not present

## 2016-01-07 DIAGNOSIS — F5104 Psychophysiologic insomnia: Secondary | ICD-10-CM | POA: Diagnosis not present

## 2016-01-07 DIAGNOSIS — Z79899 Other long term (current) drug therapy: Secondary | ICD-10-CM | POA: Diagnosis not present

## 2016-01-22 ENCOUNTER — Telehealth: Payer: Self-pay | Admitting: Emergency Medicine

## 2016-01-22 MED ORDER — PREDNISONE 5 MG PO TABS: 5.0000 mg | ORAL_TABLET | Freq: Every day | ORAL | Status: DC

## 2016-01-22 NOTE — Telephone Encounter (Signed)
Called spoke with pt. She states that she accidentally threw her prednisone in the trash can and needs a refill. I explained to her that I would call the pharmacy to make sure she didn't have pending refills. She voiced understanding and had no further questions.   Called spoke with pharmacy. He states that she has no future refills left on her prednisone.   Called spoke with pt. Informed her that pred would be sent today. She voiced understanding and had no further questions. rx sent to pharmacy on file. Nothing further needed.

## 2016-03-10 DIAGNOSIS — H524 Presbyopia: Secondary | ICD-10-CM | POA: Diagnosis not present

## 2016-03-10 DIAGNOSIS — Z79899 Other long term (current) drug therapy: Secondary | ICD-10-CM | POA: Diagnosis not present

## 2016-03-10 DIAGNOSIS — D649 Anemia, unspecified: Secondary | ICD-10-CM | POA: Diagnosis not present

## 2016-03-10 DIAGNOSIS — R799 Abnormal finding of blood chemistry, unspecified: Secondary | ICD-10-CM | POA: Diagnosis not present

## 2016-03-10 DIAGNOSIS — H25812 Combined forms of age-related cataract, left eye: Secondary | ICD-10-CM | POA: Diagnosis not present

## 2016-03-17 ENCOUNTER — Telehealth: Payer: Self-pay | Admitting: Emergency Medicine

## 2016-03-17 DIAGNOSIS — J9611 Chronic respiratory failure with hypoxia: Secondary | ICD-10-CM

## 2016-03-17 DIAGNOSIS — I4891 Unspecified atrial fibrillation: Secondary | ICD-10-CM

## 2016-03-17 NOTE — Telephone Encounter (Signed)
Called spoke with patient who reports that when she lies down he heart "beats fast and then it slows down" and "it's not beating right."  This only happens when she lays down in the afternoon and she does get dyspneic when this happens but she feels better when she sits back up.  Symptoms began 3 days ago.  Denies any CP, cough, tightness, wheezing, dizziness.  She reports this does not happen during the night, only when she lies down in the afternoon.  Her oxygen concentrator "konked out on her" last week but she has not contacted [facility] about this.  She has been using her small tanks since then - has been filling the small tanks off of one large tank that she has at home.  Per pt she does not have a cardiology. Advised pt symptoms may be because she is not receiving adequate oxygen and her concentrator needs to be looked at.  Will also route to RB to see if symptoms could be from another source.    LMOM TCB x1 for Melissa w/ [facility] to see if someone can go look at her concentrator today. RB please advise if anything needs to be added, thank you!

## 2016-03-17 NOTE — Telephone Encounter (Signed)
Called and spoke with pt and she is aware of order placed for DME to come and look at her concentrator and order placed to get her in to see cardiology.

## 2016-03-17 NOTE — Telephone Encounter (Signed)
I agree with the DME evaluation for concentrator. I also believe that we should make a referral to cardiology for he to be evaluated for rhythm disturbance.

## 2016-03-21 ENCOUNTER — Encounter: Payer: Self-pay | Admitting: Cardiovascular Disease

## 2016-03-22 ENCOUNTER — Ambulatory Visit (INDEPENDENT_AMBULATORY_CARE_PROVIDER_SITE_OTHER): Payer: Commercial Managed Care - HMO | Admitting: Cardiovascular Disease

## 2016-03-22 VITALS — BP 134/76 | HR 94 | Ht 65.0 in | Wt 156.5 lb

## 2016-03-22 DIAGNOSIS — R002 Palpitations: Secondary | ICD-10-CM

## 2016-03-22 DIAGNOSIS — R9431 Abnormal electrocardiogram [ECG] [EKG]: Secondary | ICD-10-CM

## 2016-03-22 MED ORDER — VERAPAMIL HCL ER 240 MG PO TBCR: 240.0000 mg | EXTENDED_RELEASE_TABLET | Freq: Every day | ORAL | 3 refills | Status: DC

## 2016-03-22 NOTE — Progress Notes (Signed)
Cardiology Office Note   Date:  03/22/2016   ID:  [name], DOB 1935/03/31, MRN [id number]  PCP:  Clayborn Heron, MD  Cardiologist:   Charlton Haws, MD   Chief Complaint  Patient presents with  . New Evaluation    needs clearance for cateract surgery       History of Present Illness: LEKESHIA Kennedy is a 81 y.o. female who presents for evaluation of arrhythmia. Had rheumatoid lung and COPD form smoking. Been on oxygen for 8 years continuous. No previous cardiac issues.  She had right cataract done by Dr Hazle Quant 2 years ago with good result and needs right one done No chest pain, chronic dyspnea from lung disease no syncope  Has had palpitations for the last few months Mostly at night when she lays down.   Referral indicated ? afib but ECG shows SR with MAT and multiform PAC;s.      Past Medical History:  Diagnosis Date  . Emphysema   . Hypertension   . Interstitial lung disease (HCC)   . On home O2    2L N/C  . Osteopenia   . Psoriasis   . Pthirus inguinalis   . Rheumatoid arthritis(714.0)   . Vertigo     Past Surgical History:  Procedure Laterality Date  . CHOLECYSTECTOMY    . VESICOVAGINAL FISTULA CLOSURE W/ TAH  1962     Current Outpatient Prescriptions  Medication Sig Dispense Refill  . Adalimumab (HUMIRA PEN [place]) Inject 40 mg into the skin every 14 (fourteen) days. As directed    . aspirin 81 MG tablet Take 81 mg by mouth daily.     . budesonide-formoterol (SYMBICORT) 160-4.5 MCG/ACT inhaler Inhale 2 puffs into the lungs 2 (two) times daily.     . clobetasol cream (TEMOVATE) 0.05 % Apply 1 application topically daily as needed. For psoriasis    . desonide (DESOWEN) 0.05 % ointment Apply 1 application topically daily as needed. For psoriasis    . diclofenac sodium (VOLTAREN) 1 % GEL Apply 2 g topically See admin instructions. Once every three days as needed for rheumatoid arthritis    . folic acid (FOLVITE) 800 MCG tablet Take 800 mcg by mouth  daily.     . Multiple Vitamins-Minerals (MULTIVITAMIN & MINERAL PO) Take 1 tablet by mouth daily.    . niacin 500 MG tablet Take 500 mg by mouth daily.     Marland Kitchen OVER THE COUNTER MEDICATION Apply 1 application topically daily. otc Psoriasis Control face,scalp, body lotion    . predniSONE (DELTASONE) 5 MG tablet Take 1 tablet (5 mg total) by mouth daily. 30 tablet 2  . Tiotropium Bromide Monohydrate (SPIRIVA RESPIMAT) 2.5 MCG/ACT AERS 2 puffs once a day 2 Inhaler 0  . verapamil (CALAN-SR) 240 MG CR tablet Take 1 tablet (240 mg total) by mouth at bedtime. 90 tablet 3   No current facility-administered medications for this visit.     Allergies:   Review of patient's allergies indicates no known allergies.    Social History:  The patient  reports that she quit smoking about 15 years ago. Her smoking use included Cigarettes. She has a 25.00 pack-year smoking history. She has never used smokeless tobacco. She reports that she does not drink alcohol or use drugs.   Family History:  The patient's family history includes Asthma in her sister; Colon cancer in her sister; Heart disease in her sister.    ROS:  Please see the history of  present illness.   Otherwise, review of systems are positive for none.   All other systems are reviewed and negative.    PHYSICAL EXAM: VS:  BP 134/76 (BP Location: Left Arm, Patient Position: Sitting, Cuff Size: Normal)   Pulse 94   Ht 5\' 5"  (1.651 m)   Wt 156 lb 8 oz (71 kg)   BMI 26.04 kg/m  , BMI Body mass index is 26.04 kg/m. Affect appropriate Chronically ill black female  HEENT: normal Neck supple with no adenopathy JVP normal no bruits no thyromegaly Lungs poor BS  no wheezing and good diaphragmatic motion Heart:  S1/S2 no murmur, no rub, gallop or click PMI normal Abdomen: benighn, BS positve, no tenderness, no AAA no bruit.  No HSM or HJR Distal pulses intact with no bruits No edema Neuro non-focal Skin warm and dry No muscular  weakness    EKG:   SR multiform PAC;s consistent with MAT RBBB no old ECG to compare    Recent Labs: 10/27/2015: BUN 17; Creatinine, Ser 1.35; Hemoglobin 12.5; Platelets 235; Potassium 4.2; Sodium 140    Lipid Panel    Component Value Date/Time   CHOL  08/07/2008 0510    183        ATP III CLASSIFICATION:  <200     mg/dL   Desirable  664-403  mg/dL   Borderline High  >=474    mg/dL   High          TRIG 259 08/07/2008 0510   HDL 49 08/07/2008 0510   CHOLHDL 3.7 08/07/2008 0510   VLDL 22 08/07/2008 0510   LDLCALC (H) 08/07/2008 0510    112        Total Cholesterol/HDL:CHD Risk Coronary Heart Disease Risk Table                     Men   Women  1/2 Average Risk   3.4   3.3  Average Risk       5.0   4.4  2 X Average Risk   9.6   7.1  3 X Average Risk  23.4   11.0        Use the calculated Patient Ratio above and the CHD Risk Table to determine the patient's CHD Risk.        ATP III CLASSIFICATION (LDL):  <100     mg/dL   Optimal  563-875  mg/dL   Near or Above                    Optimal  130-159  mg/dL   Borderline  643-329  mg/dL   High  >518     mg/dL   Very High      Wt Readings from Last 3 Encounters:  03/22/16 156 lb 8 oz (71 kg)  12/03/15 189 lb (85.7 kg)  05/29/15 189 lb (85.7 kg)      Other studies Reviewed: Additional studies/ records that were reviewed today include: Notes from Dr Delton Coombes ECG and CXR/CT;s .    ASSESSMENT AND PLAN:  1. MAT:  Not afib does not need anticoagulation related to severe underlying lung dx.  Change norvasc to verapamil.  48 hr holter and echo f/u EP after testing to see if any other med changes Can further suppress PAC;s 2. Cataract: preop clear to have surgery with Dr Hazle Quant 3. COPD:  F/u with Dr Delton Coombes continue oxygen 2L's , spiriva, prednisone and humira    Current medicines  are reviewed at length with the patient today.  The patient does not have concerns regarding medicines.  The following changes have been made:   D/c Norvasc start verapamil   Labs/ tests ordered today include: 48 hr holter, echo   Orders Placed This Encounter  Procedures  . Holter monitor - 48 hour  . EKG 12-Lead  . ECHOCARDIOGRAM COMPLETE     Disposition:   FU with EP post testing      Signed, Charlton Haws, MD  03/22/2016 4:26 PM    [facility] [address] [place], [place], [place]  [address] Phone: [phone]; Fax: [phone]

## 2016-03-22 NOTE — Patient Instructions (Addendum)
Medication Instructions:  Your physician has recommended you make the following change in your medication:  1-STOP Norvasc 2-START Verapamil 240 mg by mouth daily  Labwork: NONE  Testing/Procedures: Your physician has requested that you have an echocardiogram. Echocardiography is a painless test that uses sound waves to create images of your heart. It provides your doctor with information about the size and shape of your heart and how well your heart's chambers and valves are working. This procedure takes approximately one hour. There are no restrictions for this procedure.  Your physician has recommended that you wear a 48 hour holter monitor. Holter monitors are medical devices that record the heart's electrical activity. Doctors most often use these monitors to diagnose arrhythmias. Arrhythmias are problems with the speed or rhythm of the heartbeat. The monitor is a small, portable device. You can wear one while you do your normal daily activities. This is usually used to diagnose what is causing palpitations/syncope (passing out).   Follow-Up: Your physician wants you to follow-up OZ:DGUY available with Dr. Eden Emms.    If you need a refill on your cardiac medications before your next appointment, please call your pharmacy.

## 2016-03-29 ENCOUNTER — Ambulatory Visit (INDEPENDENT_AMBULATORY_CARE_PROVIDER_SITE_OTHER): Payer: Commercial Managed Care - HMO | Admitting: Emergency Medicine

## 2016-03-29 ENCOUNTER — Encounter: Payer: Self-pay | Admitting: Emergency Medicine

## 2016-03-29 DIAGNOSIS — I4719 Other supraventricular tachycardia: Secondary | ICD-10-CM

## 2016-03-29 DIAGNOSIS — J438 Other emphysema: Secondary | ICD-10-CM

## 2016-03-29 DIAGNOSIS — J8410 Pulmonary fibrosis, unspecified: Secondary | ICD-10-CM

## 2016-03-29 DIAGNOSIS — I471 Supraventricular tachycardia: Secondary | ICD-10-CM

## 2016-03-29 NOTE — Progress Notes (Signed)
Subjective:    Patient ID: [name], female    DOB: 04/06/1935, 81 y.o.   MRN: [id number]  HPI 81 year old former smoker with a history of rheumatoid arthritis who has been followed by Dr Gwenette Greet for COPD and mild interstitial lung disease that has been presumed to be related to her autoimmune process. At her last visit she was restarted on Spiriva in addition to her maintenance Symbicort. She never took the Spiriva. In fact she has been off Symbicort and started using her husband's Pulmicort primarily due to cost. She is taking pred 25m qd, has been doing so for many years. Does not want to wean to off.    She is using 2 liters per minute. Her last chest x-ray was performed on 05/23/14 and I personally reviewed this. It shows stable changes consistent with hyperinflation without any obvious interstitial disease.  She is active, is able to shop as long as she has walker and her O2. She rarely hears wheeze, has minimal cough.   ROV 12/03/15 -- patient with a history of interstitial lung disease associated with her rheumatoid arthritis as well as COPD, documented associated hypoxemia. She had been written for Spiriva and Symbicort but was not on that regimen when I met her last visit. She instead has been using her husband's Pulmicort intermittently. At her last visit we started Spiriva. She is unsure whether it  Helps much. She is on prednisone 5 g daily, Humira. Chest x-ray at her last visit in October 2016 showed moderately severe interstitial disease without any significant interval change. She has daily cough, prod of clear phlegm. Her O2 is on 2L/min, wears consistently.   ROV 03/29/16 -- This is a follow-up visit per patient has history of COPD as well as interstitial lung disease associated with autoimmune rheumatoid arthritis. She has hypoxemic respiratory failure, using 2L/min up to 3 when she is active. She believes that her dyspnea is worse than at our last visit. Most noticeable when she  ambulates. She is on Symbicort bid. She is not taking spiriva currently. No cough, no real wheeze. She is having a lot of congestion, nasal gtt, throat irritation. Not clear to me that she remembers all of her medications and how to use them. She was just seen in cardiology by Dr. NJohnsie Cancel notes indicate that she was to change from Norvasc to verapamil but she does not know anything about this.   Review of Systems As per HPI  Past Medical History:  Diagnosis Date  . Emphysema   . Hypertension   . Interstitial lung disease ([place]   . On home O2    2L N/C  . Osteopenia   . Psoriasis   . Pthirus inguinalis   . Rheumatoid arthritis(714.0)   . Vertigo      Family History  Problem Relation Age of Onset  . Heart disease Sister   . Colon cancer Sister   . Asthma Sister      Social History   Social History  . Marital status: Married    Spouse name: N/A  . Number of children: N/A  . Years of education: N/A   Occupational History  . Not on file.   Social History Main Topics  . Smoking status: Former Smoker    Packs/day: 0.50    Years: 50.00    Types: Cigarettes    Quit date: 08/01/2000  . Smokeless tobacco: Never Used  . Alcohol use No  . Drug use: No  . Sexual  activity: Not on file   Other Topics Concern  . Not on file   Social History Narrative  . No narrative on file     No Known Allergies   Outpatient Medications Prior to Visit  Medication Sig Dispense Refill  . Adalimumab (HUMIRA PEN [place]) Inject 40 mg into the skin every 14 (fourteen) days. As directed    . aspirin 81 MG tablet Take 81 mg by mouth daily.     . budesonide-formoterol (SYMBICORT) 160-4.5 MCG/ACT inhaler Inhale 2 puffs into the lungs 2 (two) times daily.     . clobetasol cream (TEMOVATE) 4.43 % Apply 1 application topically daily as needed. For psoriasis    . desonide (DESOWEN) 0.05 % ointment Apply 1 application topically daily as needed. For psoriasis    . diclofenac sodium (VOLTAREN) 1 % GEL Apply  2 g topically See admin instructions. Once every three days as needed for rheumatoid arthritis    . folic acid (FOLVITE) 154 MCG tablet Take 800 mcg by mouth daily.     . Multiple Vitamins-Minerals (MULTIVITAMIN & MINERAL PO) Take 1 tablet by mouth daily.    . niacin 500 MG tablet Take 500 mg by mouth daily.     . predniSONE (DELTASONE) 5 MG tablet Take 1 tablet (5 mg total) by mouth daily. 30 tablet 2  . Tiotropium Bromide Monohydrate (SPIRIVA RESPIMAT) 2.5 MCG/ACT AERS 2 puffs once a day 2 Inhaler 0  . OVER THE COUNTER MEDICATION Apply 1 application topically daily. otc Psoriasis Control face,scalp, body lotion    . verapamil (CALAN-SR) 240 MG CR tablet Take 1 tablet (240 mg total) by mouth at bedtime. 90 tablet 3   No facility-administered medications prior to visit.        Objective:   Physical Exam  Vitals:   03/29/16 1556 03/29/16 1557  BP:  138/86  Pulse:  82  SpO2:  95%  Weight: 186 lb (84.4 kg)   Height: 5' 8"  (1.727 m)    Gen: Pleasant, elderly woman using walker , in no distress,  normal affect  ENT: No lesions,  mouth clear,  oropharynx clear, no postnasal drip  Neck: No JVD, no stridor  Lungs: No use of accessory muscles, distant, bilateral basilar inspiratory crackles  Cardiovascular: RRR, heart sounds normal, no murmur or gallops, trace peripheral edema  Musculoskeletal: No deformities, no cyanosis or clubbing  Neuro: alert, non focal  Skin: Warm, no lesions or rashes     Assessment & Plan:  Multifocal atrial tachycardia (HCC) I asked her to call cardiology and clarify her medications. On reviewing them with her she was clear that she was supposed to change her amlodipine to verapamil. Dr Kyla Balzarine notes are clear about this. I wonder whether she is going to need help with her medications, home health assessment? I will look into this today  PULMONARY FIBROSIS In the setting of rheumatoid arthritis  COPD (chronic obstructive pulmonary disease) She is not  currently on Spiriva, tells me that she is using the Symbicort.I believe she needs  to be on both agents and we will restart.  Baltazar Apo, MD, PhD 03/29/2016, 4:16 PM [facility] [phone] or if no answer [phone]

## 2016-03-29 NOTE — Addendum Note (Signed)
Addended by: Jaynee Eagles C on: 03/29/2016 04:21 PM   Modules accepted: Orders

## 2016-03-29 NOTE — Patient Instructions (Addendum)
Please continue your Symbicort 2 puffs twice a day Restart Spiriva respimat 2 sprays once a day every day.  Take albuterol 2 puffs up to every 4 hours if needed for shortness of breath.  We will start fluticasone nasal spray, 2 sprays each side once a day Continue your oxygen at 2L/min at rest, increase to 3l/min with walking and exertion.  Follow with Dr Delton Coombes in 6 months or sooner if you have any problems

## 2016-03-29 NOTE — Assessment & Plan Note (Signed)
In the setting of rheumatoid arthritis

## 2016-03-29 NOTE — Assessment & Plan Note (Signed)
I asked her to call cardiology and clarify her medications. On reviewing them with her she was clear that she was supposed to change her amlodipine to verapamil. Dr Fabio Bering notes are clear about this. I wonder whether she is going to need help with her medications, home health assessment? I will look into this today

## 2016-03-29 NOTE — Assessment & Plan Note (Signed)
She is not currently on Spiriva, tells me that she is using the Symbicort.I believe she needs  to be on both agents and we will restart.

## 2016-03-31 ENCOUNTER — Telehealth: Payer: Self-pay | Admitting: Emergency Medicine

## 2016-03-31 DIAGNOSIS — J849 Interstitial pulmonary disease, unspecified: Secondary | ICD-10-CM

## 2016-03-31 DIAGNOSIS — Z79899 Other long term (current) drug therapy: Secondary | ICD-10-CM

## 2016-03-31 DIAGNOSIS — J438 Other emphysema: Secondary | ICD-10-CM

## 2016-03-31 NOTE — Telephone Encounter (Signed)
Home Health form has been completed. Will send staff message to [facility] making her aware of this. Nothing further was needed.

## 2016-04-01 ENCOUNTER — Other Ambulatory Visit (HOSPITAL_COMMUNITY): Payer: Commercial Managed Care - HMO

## 2016-04-05 ENCOUNTER — Telehealth: Payer: Self-pay | Admitting: Emergency Medicine

## 2016-04-05 NOTE — Telephone Encounter (Signed)
Error

## 2016-04-07 DIAGNOSIS — J849 Interstitial pulmonary disease, unspecified: Secondary | ICD-10-CM | POA: Diagnosis not present

## 2016-04-07 DIAGNOSIS — M069 Rheumatoid arthritis, unspecified: Secondary | ICD-10-CM | POA: Diagnosis not present

## 2016-04-07 DIAGNOSIS — J9610 Chronic respiratory failure, unspecified whether with hypoxia or hypercapnia: Secondary | ICD-10-CM | POA: Diagnosis not present

## 2016-04-07 DIAGNOSIS — I10 Essential (primary) hypertension: Secondary | ICD-10-CM | POA: Diagnosis not present

## 2016-04-07 DIAGNOSIS — E662 Morbid (severe) obesity with alveolar hypoventilation: Secondary | ICD-10-CM | POA: Diagnosis not present

## 2016-04-07 DIAGNOSIS — Z87891 Personal history of nicotine dependence: Secondary | ICD-10-CM | POA: Diagnosis not present

## 2016-04-07 DIAGNOSIS — J449 Chronic obstructive pulmonary disease, unspecified: Secondary | ICD-10-CM | POA: Diagnosis not present

## 2016-04-12 DIAGNOSIS — M069 Rheumatoid arthritis, unspecified: Secondary | ICD-10-CM | POA: Diagnosis not present

## 2016-04-12 DIAGNOSIS — I10 Essential (primary) hypertension: Secondary | ICD-10-CM | POA: Diagnosis not present

## 2016-04-12 DIAGNOSIS — E662 Morbid (severe) obesity with alveolar hypoventilation: Secondary | ICD-10-CM | POA: Diagnosis not present

## 2016-04-12 DIAGNOSIS — J9610 Chronic respiratory failure, unspecified whether with hypoxia or hypercapnia: Secondary | ICD-10-CM | POA: Diagnosis not present

## 2016-04-12 DIAGNOSIS — J849 Interstitial pulmonary disease, unspecified: Secondary | ICD-10-CM | POA: Diagnosis not present

## 2016-04-12 DIAGNOSIS — J449 Chronic obstructive pulmonary disease, unspecified: Secondary | ICD-10-CM | POA: Diagnosis not present

## 2016-04-12 DIAGNOSIS — Z87891 Personal history of nicotine dependence: Secondary | ICD-10-CM | POA: Diagnosis not present

## 2016-04-14 ENCOUNTER — Telehealth: Payer: Self-pay | Admitting: Emergency Medicine

## 2016-04-14 DIAGNOSIS — I10 Essential (primary) hypertension: Secondary | ICD-10-CM | POA: Diagnosis not present

## 2016-04-14 DIAGNOSIS — M069 Rheumatoid arthritis, unspecified: Secondary | ICD-10-CM | POA: Diagnosis not present

## 2016-04-14 DIAGNOSIS — Z87891 Personal history of nicotine dependence: Secondary | ICD-10-CM | POA: Diagnosis not present

## 2016-04-14 DIAGNOSIS — J449 Chronic obstructive pulmonary disease, unspecified: Secondary | ICD-10-CM | POA: Diagnosis not present

## 2016-04-14 DIAGNOSIS — E662 Morbid (severe) obesity with alveolar hypoventilation: Secondary | ICD-10-CM | POA: Diagnosis not present

## 2016-04-14 DIAGNOSIS — J849 Interstitial pulmonary disease, unspecified: Secondary | ICD-10-CM | POA: Diagnosis not present

## 2016-04-14 DIAGNOSIS — J9610 Chronic respiratory failure, unspecified whether with hypoxia or hypercapnia: Secondary | ICD-10-CM | POA: Diagnosis not present

## 2016-04-14 NOTE — Telephone Encounter (Signed)
LMTCB for [name] 

## 2016-04-14 NOTE — Telephone Encounter (Signed)
Called spoke with Enrique Sack. She reports they will not start PT until next week. Just FYI.

## 2016-04-15 NOTE — Telephone Encounter (Signed)
LMTCB for [name]

## 2016-04-18 DIAGNOSIS — Z87891 Personal history of nicotine dependence: Secondary | ICD-10-CM | POA: Diagnosis not present

## 2016-04-18 DIAGNOSIS — M069 Rheumatoid arthritis, unspecified: Secondary | ICD-10-CM | POA: Diagnosis not present

## 2016-04-18 DIAGNOSIS — E662 Morbid (severe) obesity with alveolar hypoventilation: Secondary | ICD-10-CM | POA: Diagnosis not present

## 2016-04-18 DIAGNOSIS — J849 Interstitial pulmonary disease, unspecified: Secondary | ICD-10-CM | POA: Diagnosis not present

## 2016-04-18 DIAGNOSIS — J449 Chronic obstructive pulmonary disease, unspecified: Secondary | ICD-10-CM | POA: Diagnosis not present

## 2016-04-18 DIAGNOSIS — I10 Essential (primary) hypertension: Secondary | ICD-10-CM | POA: Diagnosis not present

## 2016-04-18 DIAGNOSIS — J9610 Chronic respiratory failure, unspecified whether with hypoxia or hypercapnia: Secondary | ICD-10-CM | POA: Diagnosis not present

## 2016-04-18 NOTE — Telephone Encounter (Signed)
Called and spoke with Tiffany---  She stated that she is using the pulse on demand while at home and she is about at 76% on 2.5 liters.  Tiffany feels that the pt should be on continuous oxygen at 2.5 liters.  RB please advise.  thanks

## 2016-04-19 ENCOUNTER — Telehealth: Payer: Self-pay | Admitting: Emergency Medicine

## 2016-04-19 DIAGNOSIS — J849 Interstitial pulmonary disease, unspecified: Secondary | ICD-10-CM | POA: Diagnosis not present

## 2016-04-19 DIAGNOSIS — E662 Morbid (severe) obesity with alveolar hypoventilation: Secondary | ICD-10-CM | POA: Diagnosis not present

## 2016-04-19 DIAGNOSIS — I10 Essential (primary) hypertension: Secondary | ICD-10-CM | POA: Diagnosis not present

## 2016-04-19 DIAGNOSIS — J449 Chronic obstructive pulmonary disease, unspecified: Secondary | ICD-10-CM | POA: Diagnosis not present

## 2016-04-19 DIAGNOSIS — Z87891 Personal history of nicotine dependence: Secondary | ICD-10-CM | POA: Diagnosis not present

## 2016-04-19 DIAGNOSIS — J9610 Chronic respiratory failure, unspecified whether with hypoxia or hypercapnia: Secondary | ICD-10-CM | POA: Diagnosis not present

## 2016-04-19 DIAGNOSIS — M069 Rheumatoid arthritis, unspecified: Secondary | ICD-10-CM | POA: Diagnosis not present

## 2016-04-19 MED ORDER — BUDESONIDE-FORMOTEROL FUMARATE 160-4.5 MCG/ACT IN AERO: 2.0000 | INHALATION_SPRAY | Freq: Two times a day (BID) | RESPIRATORY_TRACT | 6 refills | Status: DC

## 2016-04-19 NOTE — Telephone Encounter (Signed)
Called and spoke with Tiffany and she stated that the pt would really benefit from the PT, and the original order came from [place].  She stated that they would need a VO for this. This was given to Tiffany for the PT.  Pt is also requesting a refill of the symbicort and this has been sent to her pharmacy.

## 2016-04-19 NOTE — Telephone Encounter (Signed)
Spoke with [place] @ [facility]. I could not find order for PT in pt's chart. Enrique Sack looked in her records and it seems PT order came from PCP, Dr Barbaraann Barthel. She will contact them for verbal order.

## 2016-04-20 NOTE — Telephone Encounter (Signed)
Will forward message to doc of the day in RB's absence.   PM please advise

## 2016-04-20 NOTE — Telephone Encounter (Signed)
Called spoke with Tiffany. She states she is returning a call that she received this am. I explained to her that I did not see where anyone called her today. She states she was unsure as well because she spoke with Leigh on 04/19/16 and everything was took care of during that conversation. She voiced understanding and had no further questions.

## 2016-04-20 NOTE — Telephone Encounter (Signed)
LM for Tiffany x 1

## 2016-04-20 NOTE — Telephone Encounter (Signed)
Ok to switch to continuous O2. She will need a reval in office for hypoxia.

## 2016-04-21 ENCOUNTER — Encounter: Payer: Self-pay | Admitting: Pulmonary Disease

## 2016-04-21 ENCOUNTER — Ambulatory Visit (INDEPENDENT_AMBULATORY_CARE_PROVIDER_SITE_OTHER): Payer: Commercial Managed Care - HMO | Admitting: Pulmonary Disease

## 2016-04-21 ENCOUNTER — Ambulatory Visit (INDEPENDENT_AMBULATORY_CARE_PROVIDER_SITE_OTHER)
Admission: RE | Admit: 2016-04-21 | Discharge: 2016-04-21 | Disposition: A | Discharge status: Home / Self Care | Payer: Commercial Managed Care - HMO | Source: Ambulatory Visit | Attending: Pulmonary Disease | Admitting: Pulmonary Disease

## 2016-04-21 VITALS — BP 124/78 | HR 95 | Wt 179.0 lb

## 2016-04-21 DIAGNOSIS — J849 Interstitial pulmonary disease, unspecified: Secondary | ICD-10-CM

## 2016-04-21 DIAGNOSIS — R05 Cough: Secondary | ICD-10-CM | POA: Diagnosis not present

## 2016-04-21 DIAGNOSIS — J8410 Pulmonary fibrosis, unspecified: Secondary | ICD-10-CM

## 2016-04-21 DIAGNOSIS — J449 Chronic obstructive pulmonary disease, unspecified: Secondary | ICD-10-CM | POA: Diagnosis not present

## 2016-04-21 DIAGNOSIS — J9611 Chronic respiratory failure with hypoxia: Secondary | ICD-10-CM

## 2016-04-21 DIAGNOSIS — R0602 Shortness of breath: Secondary | ICD-10-CM

## 2016-04-21 MED ORDER — ALBUTEROL SULFATE HFA 108 (90 BASE) MCG/ACT IN AERS: 2.0000 | INHALATION_SPRAY | Freq: Four times a day (QID) | RESPIRATORY_TRACT | 2 refills | Status: AC | PRN

## 2016-04-21 MED ORDER — PREDNISONE 10 MG PO TABS
ORAL_TABLET | ORAL | 0 refills | Status: DC
Start: 2016-04-21 — End: 2016-06-06

## 2016-04-21 NOTE — Assessment & Plan Note (Signed)
Stay on 3 L oxygen continuous at home

## 2016-04-21 NOTE — Assessment & Plan Note (Signed)
Unclear if she is having a flareup of COPD or ILD  Prednisone 10 mg tablets -Take 4 tabs  daily with food x 4 days, then 3 tabs daily x 4 days, then 2 tabs daily x 4 days, then 1 tab daily x4 days then go back to your usual dose of 5 mg daily  Take Mucinex as needed twice daily for cough Call for antibiotic for fever or if sputum turns yellow-green

## 2016-04-21 NOTE — Telephone Encounter (Signed)
Spoke with Tiffany at [facility], aware of 02 change and recs. lmtcb X1 for pt to schedule appt.

## 2016-04-21 NOTE — Progress Notes (Signed)
   Subjective:    Patient ID: [name], female    DOB: 10-07-1934, 81 y.o.   MRN: [id number]  HPI  81 year old former smoker with a history of rheumatoid arthritis ,COPD and mild interstitial lung disease that has been presumed to be related to her autoimmune process   Chief Complaint  Patient presents with  . Acute Visit    Reports DOE, chest tightness, wheezing and cough. Cough is non productive. Symptoms started 4 days ago.   Accompanied by her husband  For 4 days she reports dry cough and chest tightness and wheezing, no sick contacts or preceding URI or fever She reports increasing edema in her left leg She was noted to be hypoxic on her pulse O2 and placed on 3 L continuous with improvement in saturation from 78% to 96% She denies chest pain or orthopnea She is unable to walk for long distances  Review of meds-she is compliant with Symbicort, he stopped using Spiriva since this does not work. She does not seem to have rescue MDI  She is maintained on 5 mg of prednisone by Dr. Kellie Simmering and Humira shots, no flare up of arthritis   Review of Systems neg for any significant sore throat, dysphagia, itching, sneezing, nasal congestion or excess/ purulent secretions, fever, chills, sweats, unintended wt loss, pleuritic or exertional cp, hempoptysis, orthopnea pnd or change in chronic leg swelling.   Also denies presyncope, palpitations, heartburn, abdominal pain, nausea, vomiting, diarrhea or change in bowel or urinary habits, dysuria,hematuria, rash, arthralgias, visual complaints, headache, numbness weakness or ataxia.     Objective:   Physical Exam  Gen. Pleasant, well-nourished, in no distress ENT - no lesions, no post nasal drip Neck: No JVD, no thyromegaly, no carotid bruits Lungs: no use of accessory muscles, no dullness to percussion, Decreased with fine crackles right lower lobe, no rhonchi  Cardiovascular: Rhythm regular, heart sounds  normal, no murmurs or  gallops, 1+ peripheral edema Musculoskeletal: No deformities, no cyanosis or clubbing         Assessment & Plan:

## 2016-04-21 NOTE — Patient Instructions (Signed)
CXR today Blood work today-we will call you with results  Prednisone 10 mg tablets -Take 4 tabs  daily with food x 4 days, then 3 tabs daily x 4 days, then 2 tabs daily x 4 days, then 1 tab daily x4 days then go back to your usual dose of 5 mg daily  Take Mucinex as needed twice daily for cough Call for antibiotic for fever or if sputum turns yellow-green

## 2016-04-21 NOTE — Assessment & Plan Note (Signed)
Sample of Symbicort Prescription for albuterol MDI 2 puffs every 6 hours as needed We'll need to resume Spiriva

## 2016-04-22 ENCOUNTER — Telehealth: Payer: Self-pay | Admitting: Emergency Medicine

## 2016-04-22 ENCOUNTER — Other Ambulatory Visit (INDEPENDENT_AMBULATORY_CARE_PROVIDER_SITE_OTHER): Payer: Commercial Managed Care - HMO

## 2016-04-22 DIAGNOSIS — J849 Interstitial pulmonary disease, unspecified: Secondary | ICD-10-CM

## 2016-04-22 LAB — CBC WITH DIFFERENTIAL/PLATELET
BASOS ABS: 0 10*3/uL (ref 0.0–0.1)
BASOS PCT: 0.4 % (ref 0.0–3.0)
EOS ABS: 0.1 10*3/uL (ref 0.0–0.7)
Eosinophils Relative: 1.1 % (ref 0.0–5.0)
HCT: 34.7 % — ABNORMAL LOW (ref 36.0–46.0)
Hemoglobin: 10.9 g/dL — ABNORMAL LOW (ref 12.0–15.0)
LYMPHS ABS: 2.6 10*3/uL (ref 0.7–4.0)
Lymphocytes Relative: 29.1 % (ref 12.0–46.0)
MCHC: 31.3 g/dL (ref 30.0–36.0)
MONOS PCT: 9.3 % (ref 3.0–12.0)
Monocytes Absolute: 0.8 10*3/uL (ref 0.1–1.0)
NEUTROS ABS: 5.5 10*3/uL (ref 1.4–7.7)
NEUTROS PCT: 60.1 % (ref 43.0–77.0)
PLATELETS: 390 10*3/uL (ref 150.0–400.0)
RBC: 5.05 Mil/uL (ref 3.87–5.11)
RDW: 20.4 % — AB (ref 11.5–15.5)
WBC: 9.1 10*3/uL (ref 4.0–10.5)

## 2016-04-22 NOTE — Telephone Encounter (Signed)
Pt has appt with RB 04/27/16. Nothing further needed.

## 2016-04-22 NOTE — Telephone Encounter (Signed)
Patient returned call, hung up before I could get call back number. °

## 2016-04-22 NOTE — Telephone Encounter (Signed)
LMTCB x1 for pt.  

## 2016-04-22 NOTE — Telephone Encounter (Signed)
lmtcb for pt.  

## 2016-04-25 DIAGNOSIS — H2512 Age-related nuclear cataract, left eye: Secondary | ICD-10-CM | POA: Diagnosis not present

## 2016-04-25 NOTE — Telephone Encounter (Signed)
Called and spoke with pt and she stated that her SOB is better today and she is aware of cxr and lab results per RA.  Pt advised to call back if any other concerns arise.

## 2016-04-27 ENCOUNTER — Ambulatory Visit: Payer: Commercial Managed Care - HMO | Admitting: Emergency Medicine

## 2016-04-27 DIAGNOSIS — E662 Morbid (severe) obesity with alveolar hypoventilation: Secondary | ICD-10-CM | POA: Diagnosis not present

## 2016-04-27 DIAGNOSIS — Z87891 Personal history of nicotine dependence: Secondary | ICD-10-CM | POA: Diagnosis not present

## 2016-04-27 DIAGNOSIS — J9610 Chronic respiratory failure, unspecified whether with hypoxia or hypercapnia: Secondary | ICD-10-CM | POA: Diagnosis not present

## 2016-04-27 DIAGNOSIS — J449 Chronic obstructive pulmonary disease, unspecified: Secondary | ICD-10-CM | POA: Diagnosis not present

## 2016-04-27 DIAGNOSIS — J849 Interstitial pulmonary disease, unspecified: Secondary | ICD-10-CM | POA: Diagnosis not present

## 2016-04-27 DIAGNOSIS — M069 Rheumatoid arthritis, unspecified: Secondary | ICD-10-CM | POA: Diagnosis not present

## 2016-04-27 DIAGNOSIS — I10 Essential (primary) hypertension: Secondary | ICD-10-CM | POA: Diagnosis not present

## 2016-04-29 ENCOUNTER — Telehealth: Payer: Self-pay | Admitting: Emergency Medicine

## 2016-04-29 MED ORDER — LEVOFLOXACIN 500 MG PO TABS: 500.0000 mg | ORAL_TABLET | Freq: Every day | ORAL | 0 refills | Status: DC

## 2016-04-29 NOTE — Telephone Encounter (Signed)
  Please ask her to take levaquin 500mg  daily x 7 days Call next week to let us know her status - if no better we will need to see her in office Insure she knows to go to ED if any worsening - more SOB, chest pain, more blood tinged mucous

## 2016-04-29 NOTE — Telephone Encounter (Signed)
Called spoke with pt. Made aware of recs. Rx sent in. Nothing further needed

## 2016-05-02 ENCOUNTER — Telehealth: Payer: Self-pay | Admitting: Emergency Medicine

## 2016-05-02 DIAGNOSIS — J9610 Chronic respiratory failure, unspecified whether with hypoxia or hypercapnia: Secondary | ICD-10-CM | POA: Diagnosis not present

## 2016-05-02 DIAGNOSIS — I10 Essential (primary) hypertension: Secondary | ICD-10-CM | POA: Diagnosis not present

## 2016-05-02 DIAGNOSIS — E662 Morbid (severe) obesity with alveolar hypoventilation: Secondary | ICD-10-CM | POA: Diagnosis not present

## 2016-05-02 DIAGNOSIS — J449 Chronic obstructive pulmonary disease, unspecified: Secondary | ICD-10-CM | POA: Diagnosis not present

## 2016-05-02 DIAGNOSIS — M069 Rheumatoid arthritis, unspecified: Secondary | ICD-10-CM | POA: Diagnosis not present

## 2016-05-02 DIAGNOSIS — J849 Interstitial pulmonary disease, unspecified: Secondary | ICD-10-CM | POA: Diagnosis not present

## 2016-05-02 DIAGNOSIS — Z87891 Personal history of nicotine dependence: Secondary | ICD-10-CM | POA: Diagnosis not present

## 2016-05-02 NOTE — Telephone Encounter (Signed)
Called and spoke with Enrique Sack from [facility]---she stated that she had to call about the interaction with the levaquin and the trazadone.  She stated that if RB still wants the pt to take this to call them back and they will let the pt know.  RB please advise. thanks

## 2016-05-03 MED ORDER — DOXYCYCLINE HYCLATE 100 MG PO TABS: 100.0000 mg | ORAL_TABLET | Freq: Two times a day (BID) | ORAL | 0 refills | Status: DC

## 2016-05-03 NOTE — Telephone Encounter (Signed)
Spoke with [name] - aware of rec's per RB Rx sent to [facility] Nothing further needed.

## 2016-05-03 NOTE — Telephone Encounter (Signed)
OK to change to doxycycline 100mg  bid x 7 days.

## 2016-05-04 DIAGNOSIS — H2512 Age-related nuclear cataract, left eye: Secondary | ICD-10-CM | POA: Diagnosis not present

## 2016-05-04 DIAGNOSIS — H25812 Combined forms of age-related cataract, left eye: Secondary | ICD-10-CM | POA: Diagnosis not present

## 2016-05-06 DIAGNOSIS — J449 Chronic obstructive pulmonary disease, unspecified: Secondary | ICD-10-CM | POA: Diagnosis not present

## 2016-05-06 DIAGNOSIS — J849 Interstitial pulmonary disease, unspecified: Secondary | ICD-10-CM | POA: Diagnosis not present

## 2016-05-06 DIAGNOSIS — E662 Morbid (severe) obesity with alveolar hypoventilation: Secondary | ICD-10-CM | POA: Diagnosis not present

## 2016-05-06 DIAGNOSIS — M069 Rheumatoid arthritis, unspecified: Secondary | ICD-10-CM | POA: Diagnosis not present

## 2016-05-06 DIAGNOSIS — J9610 Chronic respiratory failure, unspecified whether with hypoxia or hypercapnia: Secondary | ICD-10-CM | POA: Diagnosis not present

## 2016-05-06 DIAGNOSIS — Z87891 Personal history of nicotine dependence: Secondary | ICD-10-CM | POA: Diagnosis not present

## 2016-05-06 DIAGNOSIS — I10 Essential (primary) hypertension: Secondary | ICD-10-CM | POA: Diagnosis not present

## 2016-05-10 ENCOUNTER — Telehealth: Payer: Self-pay | Admitting: Emergency Medicine

## 2016-05-10 DIAGNOSIS — J849 Interstitial pulmonary disease, unspecified: Secondary | ICD-10-CM | POA: Diagnosis not present

## 2016-05-10 DIAGNOSIS — J449 Chronic obstructive pulmonary disease, unspecified: Secondary | ICD-10-CM | POA: Diagnosis not present

## 2016-05-10 DIAGNOSIS — E662 Morbid (severe) obesity with alveolar hypoventilation: Secondary | ICD-10-CM | POA: Diagnosis not present

## 2016-05-10 DIAGNOSIS — I10 Essential (primary) hypertension: Secondary | ICD-10-CM | POA: Diagnosis not present

## 2016-05-10 DIAGNOSIS — M069 Rheumatoid arthritis, unspecified: Secondary | ICD-10-CM | POA: Diagnosis not present

## 2016-05-10 DIAGNOSIS — J9610 Chronic respiratory failure, unspecified whether with hypoxia or hypercapnia: Secondary | ICD-10-CM | POA: Diagnosis not present

## 2016-05-10 DIAGNOSIS — Z87891 Personal history of nicotine dependence: Secondary | ICD-10-CM | POA: Diagnosis not present

## 2016-05-10 MED ORDER — PREDNISONE 10 MG PO TABS: ORAL_TABLET | ORAL | 0 refills | Status: DC

## 2016-05-10 NOTE — Telephone Encounter (Signed)
Called and spoke with  Enrique Sack from [facility] and she is aware of the taper of the prednisone to take.  Enrique Sack stated that she will call back if the pt needs another refill of the prednisone.

## 2016-05-10 NOTE — Telephone Encounter (Signed)
Please give pred taper > Take 40mg  daily for 3 days, then 30mg  daily for 3 days, then 20mg  daily for 3 days, then 10mg  daily indefinitely until we follow up and review

## 2016-05-10 NOTE — Telephone Encounter (Signed)
Spoke with Enrique Sack, PT with [facility].  She seen pt today and still c/o prod cough (brown with some blood tinged), increased sob, desats in house when walking, had to use HHN 2 times during the night.  Has one day left of Doxycycline and is on maintenance dose of Pred 5mg /day.  states pt improved greatly the last time we gave her a pred taper (04/21/16).  Please advise.

## 2016-05-10 NOTE — Telephone Encounter (Signed)
Refill of the prednisone has been sent to the pharmacy and [name] is aware.

## 2016-05-12 ENCOUNTER — Ambulatory Visit: Payer: Commercial Managed Care - HMO | Admitting: Adult Health

## 2016-05-12 ENCOUNTER — Telehealth (HOSPITAL_COMMUNITY): Payer: Self-pay | Admitting: Cardiovascular Disease

## 2016-05-12 NOTE — Telephone Encounter (Signed)
Several attempts have been made to contact this patient(8/23,10/4,10/12) to reschedule the Echo and Holter monitor  Dr. Eden Emms  ordered for her back in August. She continues to say that she is busy and had other things going on so I just wanted to inform you that I will be removing her from the workqueue. Have a great day!

## 2016-05-13 ENCOUNTER — Telehealth: Payer: Self-pay | Admitting: Emergency Medicine

## 2016-05-13 DIAGNOSIS — J9610 Chronic respiratory failure, unspecified whether with hypoxia or hypercapnia: Secondary | ICD-10-CM | POA: Diagnosis not present

## 2016-05-13 DIAGNOSIS — Z87891 Personal history of nicotine dependence: Secondary | ICD-10-CM | POA: Diagnosis not present

## 2016-05-13 DIAGNOSIS — J449 Chronic obstructive pulmonary disease, unspecified: Secondary | ICD-10-CM | POA: Diagnosis not present

## 2016-05-13 DIAGNOSIS — J849 Interstitial pulmonary disease, unspecified: Secondary | ICD-10-CM | POA: Diagnosis not present

## 2016-05-13 DIAGNOSIS — E662 Morbid (severe) obesity with alveolar hypoventilation: Secondary | ICD-10-CM | POA: Diagnosis not present

## 2016-05-13 DIAGNOSIS — I10 Essential (primary) hypertension: Secondary | ICD-10-CM | POA: Diagnosis not present

## 2016-05-13 DIAGNOSIS — M069 Rheumatoid arthritis, unspecified: Secondary | ICD-10-CM | POA: Diagnosis not present

## 2016-05-13 NOTE — Telephone Encounter (Signed)
Yes this is ok 

## 2016-05-13 NOTE — Telephone Encounter (Signed)
[name] is calling and needed order for home PT once weekly.  Please advise if you ok to give VO for this. thanks

## 2016-05-13 NOTE — Telephone Encounter (Signed)
LMTCB

## 2016-05-16 NOTE — Telephone Encounter (Signed)
Spoke with Enrique Sack, given VO for PT once weekly.  Nothing further needed.

## 2016-05-17 DIAGNOSIS — I10 Essential (primary) hypertension: Secondary | ICD-10-CM | POA: Diagnosis not present

## 2016-05-17 DIAGNOSIS — J449 Chronic obstructive pulmonary disease, unspecified: Secondary | ICD-10-CM | POA: Diagnosis not present

## 2016-05-17 DIAGNOSIS — E662 Morbid (severe) obesity with alveolar hypoventilation: Secondary | ICD-10-CM | POA: Diagnosis not present

## 2016-05-17 DIAGNOSIS — J9610 Chronic respiratory failure, unspecified whether with hypoxia or hypercapnia: Secondary | ICD-10-CM | POA: Diagnosis not present

## 2016-05-17 DIAGNOSIS — Z87891 Personal history of nicotine dependence: Secondary | ICD-10-CM | POA: Diagnosis not present

## 2016-05-17 DIAGNOSIS — J849 Interstitial pulmonary disease, unspecified: Secondary | ICD-10-CM | POA: Diagnosis not present

## 2016-05-17 DIAGNOSIS — M069 Rheumatoid arthritis, unspecified: Secondary | ICD-10-CM | POA: Diagnosis not present

## 2016-05-19 DIAGNOSIS — J9610 Chronic respiratory failure, unspecified whether with hypoxia or hypercapnia: Secondary | ICD-10-CM | POA: Diagnosis not present

## 2016-05-19 DIAGNOSIS — I10 Essential (primary) hypertension: Secondary | ICD-10-CM | POA: Diagnosis not present

## 2016-05-19 DIAGNOSIS — M069 Rheumatoid arthritis, unspecified: Secondary | ICD-10-CM | POA: Diagnosis not present

## 2016-05-19 DIAGNOSIS — Z87891 Personal history of nicotine dependence: Secondary | ICD-10-CM | POA: Diagnosis not present

## 2016-05-19 DIAGNOSIS — J849 Interstitial pulmonary disease, unspecified: Secondary | ICD-10-CM | POA: Diagnosis not present

## 2016-05-19 DIAGNOSIS — J449 Chronic obstructive pulmonary disease, unspecified: Secondary | ICD-10-CM | POA: Diagnosis not present

## 2016-05-19 DIAGNOSIS — E662 Morbid (severe) obesity with alveolar hypoventilation: Secondary | ICD-10-CM | POA: Diagnosis not present

## 2016-05-24 DIAGNOSIS — E662 Morbid (severe) obesity with alveolar hypoventilation: Secondary | ICD-10-CM | POA: Diagnosis not present

## 2016-05-24 DIAGNOSIS — M069 Rheumatoid arthritis, unspecified: Secondary | ICD-10-CM | POA: Diagnosis not present

## 2016-05-24 DIAGNOSIS — J449 Chronic obstructive pulmonary disease, unspecified: Secondary | ICD-10-CM | POA: Diagnosis not present

## 2016-05-24 DIAGNOSIS — J849 Interstitial pulmonary disease, unspecified: Secondary | ICD-10-CM | POA: Diagnosis not present

## 2016-05-24 DIAGNOSIS — Z87891 Personal history of nicotine dependence: Secondary | ICD-10-CM | POA: Diagnosis not present

## 2016-05-24 DIAGNOSIS — I10 Essential (primary) hypertension: Secondary | ICD-10-CM | POA: Diagnosis not present

## 2016-05-24 DIAGNOSIS — J9610 Chronic respiratory failure, unspecified whether with hypoxia or hypercapnia: Secondary | ICD-10-CM | POA: Diagnosis not present

## 2016-05-31 DIAGNOSIS — R600 Localized edema: Secondary | ICD-10-CM | POA: Diagnosis not present

## 2016-05-31 DIAGNOSIS — D649 Anemia, unspecified: Secondary | ICD-10-CM | POA: Diagnosis not present

## 2016-05-31 DIAGNOSIS — M0570 Rheumatoid arthritis with rheumatoid factor of unspecified site without organ or systems involvement: Secondary | ICD-10-CM | POA: Diagnosis not present

## 2016-05-31 DIAGNOSIS — M7532 Calcific tendinitis of left shoulder: Secondary | ICD-10-CM | POA: Diagnosis not present

## 2016-05-31 DIAGNOSIS — Z79899 Other long term (current) drug therapy: Secondary | ICD-10-CM | POA: Diagnosis not present

## 2016-06-01 DIAGNOSIS — M069 Rheumatoid arthritis, unspecified: Secondary | ICD-10-CM | POA: Diagnosis not present

## 2016-06-01 DIAGNOSIS — J9610 Chronic respiratory failure, unspecified whether with hypoxia or hypercapnia: Secondary | ICD-10-CM | POA: Diagnosis not present

## 2016-06-01 DIAGNOSIS — Z87891 Personal history of nicotine dependence: Secondary | ICD-10-CM | POA: Diagnosis not present

## 2016-06-01 DIAGNOSIS — E662 Morbid (severe) obesity with alveolar hypoventilation: Secondary | ICD-10-CM | POA: Diagnosis not present

## 2016-06-01 DIAGNOSIS — I10 Essential (primary) hypertension: Secondary | ICD-10-CM | POA: Diagnosis not present

## 2016-06-01 DIAGNOSIS — J449 Chronic obstructive pulmonary disease, unspecified: Secondary | ICD-10-CM | POA: Diagnosis not present

## 2016-06-01 DIAGNOSIS — J849 Interstitial pulmonary disease, unspecified: Secondary | ICD-10-CM | POA: Diagnosis not present

## 2016-06-03 ENCOUNTER — Telehealth: Payer: Self-pay | Admitting: Emergency Medicine

## 2016-06-03 DIAGNOSIS — J9610 Chronic respiratory failure, unspecified whether with hypoxia or hypercapnia: Secondary | ICD-10-CM | POA: Diagnosis not present

## 2016-06-03 DIAGNOSIS — J849 Interstitial pulmonary disease, unspecified: Secondary | ICD-10-CM | POA: Diagnosis not present

## 2016-06-03 DIAGNOSIS — J449 Chronic obstructive pulmonary disease, unspecified: Secondary | ICD-10-CM | POA: Diagnosis not present

## 2016-06-03 DIAGNOSIS — M069 Rheumatoid arthritis, unspecified: Secondary | ICD-10-CM | POA: Diagnosis not present

## 2016-06-03 DIAGNOSIS — E662 Morbid (severe) obesity with alveolar hypoventilation: Secondary | ICD-10-CM | POA: Diagnosis not present

## 2016-06-03 DIAGNOSIS — I10 Essential (primary) hypertension: Secondary | ICD-10-CM | POA: Diagnosis not present

## 2016-06-03 DIAGNOSIS — Z87891 Personal history of nicotine dependence: Secondary | ICD-10-CM | POA: Diagnosis not present

## 2016-06-03 NOTE — Telephone Encounter (Signed)
Spoke with Enrique Sack, PT at [facility], states that pt is requesting a continuous flow 02 delivery system for exertional 02.  Pt's current system is pulse dosage, and Enrique Sack states pt has increased SOB with exertion and believes that a continuous flow could help pt. Enrique Sack would also like pt to be started on a medication for allergies.  Pt has an appt on Monday (11/6). RB please advise.  Thanks!

## 2016-06-03 NOTE — Telephone Encounter (Signed)
[name] (patient physical therapist) called back contact # [phone].Marland KitchenCharm Kennedy

## 2016-06-03 NOTE — Telephone Encounter (Signed)
Opened in error

## 2016-06-03 NOTE — Telephone Encounter (Signed)
lmomtcb x 1 for kendra from [facility]

## 2016-06-06 ENCOUNTER — Encounter: Payer: Self-pay | Admitting: Emergency Medicine

## 2016-06-06 ENCOUNTER — Other Ambulatory Visit: Payer: Commercial Managed Care - HMO

## 2016-06-06 ENCOUNTER — Ambulatory Visit (INDEPENDENT_AMBULATORY_CARE_PROVIDER_SITE_OTHER): Payer: Commercial Managed Care - HMO | Admitting: Emergency Medicine

## 2016-06-06 VITALS — BP 124/70 | HR 66 | Wt 183.0 lb

## 2016-06-06 DIAGNOSIS — J9611 Chronic respiratory failure with hypoxia: Secondary | ICD-10-CM | POA: Diagnosis not present

## 2016-06-06 DIAGNOSIS — J8410 Pulmonary fibrosis, unspecified: Secondary | ICD-10-CM | POA: Diagnosis not present

## 2016-06-06 DIAGNOSIS — J849 Interstitial pulmonary disease, unspecified: Secondary | ICD-10-CM

## 2016-06-06 DIAGNOSIS — J449 Chronic obstructive pulmonary disease, unspecified: Secondary | ICD-10-CM | POA: Diagnosis not present

## 2016-06-06 NOTE — Assessment & Plan Note (Signed)
Continue symbicort, o2 Sputum cx given her purulent s[putu,m despite recent abx

## 2016-06-06 NOTE — Patient Instructions (Signed)
We will work on getting continuous oxygen at 3L/min, change from pulsed.  Continue symbicort twice a day We will obtain a sputum sample to send for culture.  We will work on changing your oxygen to continuous  flow tanks.  We will repeat your CT scan chest, no contrast We will repeat full pulmonary function testing.  Follow with Dr Delton Coombes next available after your testing to review.

## 2016-06-06 NOTE — Telephone Encounter (Signed)
Addressed at her OV

## 2016-06-06 NOTE — Assessment & Plan Note (Signed)
At this time is quite difficult to discern whether her progressive dyspnea is related to interstitial disease versus her known chronic obstruction. She does have chronic cough, mucus, purulent sputum that suggest worsening COPD. I believe she needs a repeat CT scan of the chest and a repeat pulmonary function test. We will change her oxygen to continuous flow, 3l/min

## 2016-06-06 NOTE — Assessment & Plan Note (Signed)
CT chest to compare w prior imaging

## 2016-06-06 NOTE — Progress Notes (Signed)
Subjective:    Patient ID: [name], female    DOB: 1935-05-07, 81 y.o.   MRN: [id number]  HPI 81 year old former smoker with a history of rheumatoid arthritis who has been followed by Dr Gwenette Greet for COPD and mild interstitial lung disease that has been presumed to be related to her autoimmune process. At her last visit she was restarted on Spiriva in addition to her maintenance Symbicort. She never took the Spiriva. In fact she has been off Symbicort and started using her husband's Pulmicort primarily due to cost. She is taking pred 24m qd, has been doing so for many years. Does not want to wean to off.    She is using 2 liters per minute. Her last chest x-ray was performed on 05/23/14 and I personally reviewed this. It shows stable changes consistent with hyperinflation without any obvious interstitial disease.  She is active, is able to shop as long as she has walker and her O2. She rarely hears wheeze, has minimal cough.   ROV 12/03/15 -- patient with a history of interstitial lung disease associated with her rheumatoid arthritis as well as COPD, documented associated hypoxemia. She had been written for Spiriva and Symbicort but was not on that regimen when I met her last visit. She instead has been using her husband's Pulmicort intermittently. At her last visit we started Spiriva. She is unsure whether it  Helps much. She is on prednisone 5 g daily, Humira. Chest x-ray at her last visit in October 2016 showed moderately severe interstitial disease without any significant interval change. She has daily cough, prod of clear phlegm. Her O2 is on 2L/min, wears consistently.   ROV 03/29/16 -- This is a follow-up visit per patient has history of COPD as well as interstitial lung disease associated with autoimmune rheumatoid arthritis. She has hypoxemic respiratory failure, using 2L/min up to 3 when she is active. She believes that her dyspnea is worse than at our last visit. Most noticeable when she  ambulates. She is on Symbicort bid. She is not taking spiriva currently. No cough, no real wheeze. She is having a lot of congestion, nasal gtt, throat irritation. Not clear to me that she remembers all of her medications and how to use them. She was just seen in cardiology by Dr. NJohnsie Cancel notes indicate that she was to change from Norvasc to verapamil but she does not know anything about this.  ROV 06/06/16 -- this is a follow-up visit for chronic hypoxemic respiratory failure in the setting of COPD and ILD presumed to be related to RA. She was treated in the September with a prednisone taper for a presumed flare of COPD/ILD. Also given levofloxacin. Then she was tapered again 10/10. She is having continued cough, dyspnea. Dark mucous. She is on symbicort. She would like to change from pulsed o2 to continuous flow 3L/min. Her last PFT were in 2008.    Review of Systems As per HPI  Past Medical History:  Diagnosis Date  . Emphysema   . Hypertension   . Interstitial lung disease ([place]   . On home O2    2L N/C  . Osteopenia   . Psoriasis   . Pthirus inguinalis   . Rheumatoid arthritis(714.0)   . Vertigo      Family History  Problem Relation Age of Onset  . Heart disease Sister   . Colon cancer Sister   . Asthma Sister      Social History   Social History  .  Marital status: Married    Spouse name: N/A  . Number of children: N/A  . Years of education: N/A   Occupational History  . Not on file.   Social History Main Topics  . Smoking status: Former Smoker    Packs/day: 0.50    Years: 50.00    Types: Cigarettes    Quit date: 08/01/2000  . Smokeless tobacco: Never Used  . Alcohol use No  . Drug use: No  . Sexual activity: Not on file   Other Topics Concern  . Not on file   Social History Narrative  . No narrative on file     No Known Allergies   Outpatient Medications Prior to Visit  Medication Sig Dispense Refill  . Adalimumab (HUMIRA PEN [place]) Inject 40 mg into the  skin every 14 (fourteen) days. As directed    . albuterol (PROVENTIL HFA;VENTOLIN HFA) 108 (90 Base) MCG/ACT inhaler Inhale 2 puffs into the lungs every 6 (six) hours as needed for wheezing or shortness of breath. 1 Inhaler 2  . aspirin 81 MG tablet Take 81 mg by mouth daily.     . budesonide-formoterol (SYMBICORT) 160-4.5 MCG/ACT inhaler Inhale 2 puffs into the lungs 2 (two) times daily. 1 Inhaler 6  . clobetasol cream (TEMOVATE) 2.67 % Apply 1 application topically daily as needed. For psoriasis    . desonide (DESOWEN) 0.05 % ointment Apply 1 application topically daily as needed. For psoriasis    . diclofenac sodium (VOLTAREN) 1 % GEL Apply 2 g topically See admin instructions. Once every three days as needed for rheumatoid arthritis    . doxycycline (VIBRA-TABS) 100 MG tablet Take 1 tablet (100 mg total) by mouth 2 (two) times daily. 14 tablet 0  . folic acid (FOLVITE) 124 MCG tablet Take 800 mcg by mouth daily.     . Multiple Vitamins-Minerals (MULTIVITAMIN & MINERAL PO) Take 1 tablet by mouth daily.    . niacin 500 MG tablet Take 500 mg by mouth daily.     . predniSONE (DELTASONE) 5 MG tablet Take 1 tablet (5 mg total) by mouth daily. 30 tablet 2  . levofloxacin (LEVAQUIN) 500 MG tablet Take 1 tablet (500 mg total) by mouth daily. 7 tablet 0  . predniSONE (DELTASONE) 10 MG tablet Take 4 tabs x4 days, 3 tabs x4 days, 2 tabs x4 days, 1 tab x4 days then back to your normal dose 40 tablet 0  . predniSONE (DELTASONE) 10 MG tablet Take 40 mg  X 3 days, 30 mg x 3 days, 20 mg x 3 days 27 tablet 0  . Tiotropium Bromide Monohydrate (SPIRIVA RESPIMAT) 2.5 MCG/ACT AERS 2 puffs once a day 2 Inhaler 0   No facility-administered medications prior to visit.        Objective:   Physical Exam  Vitals:   06/06/16 1330  BP: 124/70  Pulse: 66  SpO2: 90%  Weight: 183 lb (83 kg)   Gen: Pleasant, elderly woman using walker , in no distress,  normal affect  ENT: No lesions,  mouth clear,  oropharynx  clear, no postnasal drip  Neck: No JVD, no stridor  Lungs: No use of accessory muscles, distant, bilateral basilar inspiratory crackles  Cardiovascular: RRR, heart sounds normal, no murmur or gallops, trace peripheral edema  Musculoskeletal: No deformities, no cyanosis or clubbing  Neuro: alert, non focal  Skin: Warm, no lesions or rashes     Assessment & Plan:  RESPIRATORY FAILURE, CHRONIC At this time is quite difficult  to discern whether her progressive dyspnea is related to interstitial disease versus her known chronic obstruction. She does have chronic cough, mucus, purulent sputum that suggest worsening COPD. I believe she needs a repeat CT scan of the chest and a repeat pulmonary function test. We will change her oxygen to continuous flow, 3l/min  COPD (chronic obstructive pulmonary disease) Continue symbicort, o2 Sputum cx given her purulent s[putu,m despite recent abx  PULMONARY FIBROSIS CT chest to compare w prior imaging  Baltazar Apo, MD, PhD 06/06/2016, 1:52 PM [facility] [phone] or if no answer [phone]

## 2016-06-07 ENCOUNTER — Telehealth: Payer: Self-pay | Admitting: Emergency Medicine

## 2016-06-07 NOTE — Telephone Encounter (Signed)
Lm for [name] with [employer] [address]. Will await call back.

## 2016-06-07 NOTE — Telephone Encounter (Signed)
Spoke with stacy who states pt CT was scheduled yesterday, pt called back today and states she would like to cancel her CT until after her appointment which is scheduled for 08-09-16. I then spoke with pt who states she is needing to cancel her CT due to not being able to afford it at this time. Pt states she is planning on keeping her scheduled appointment with RB for 08-09-16 with a PFT prior.  Will send to RB as a FYI. Thanks.

## 2016-06-17 ENCOUNTER — Inpatient Hospital Stay: Admission: RE | Admit: 2016-06-17 | Payer: Commercial Managed Care - HMO | Source: Ambulatory Visit

## 2016-06-29 ENCOUNTER — Ambulatory Visit: Payer: Commercial Managed Care - HMO | Admitting: Cardiovascular Disease

## 2016-06-30 DIAGNOSIS — R600 Localized edema: Secondary | ICD-10-CM | POA: Diagnosis not present

## 2016-06-30 DIAGNOSIS — M0570 Rheumatoid arthritis with rheumatoid factor of unspecified site without organ or systems involvement: Secondary | ICD-10-CM | POA: Diagnosis not present

## 2016-06-30 DIAGNOSIS — Z79899 Other long term (current) drug therapy: Secondary | ICD-10-CM | POA: Diagnosis not present

## 2016-06-30 DIAGNOSIS — M7532 Calcific tendinitis of left shoulder: Secondary | ICD-10-CM | POA: Diagnosis not present

## 2016-06-30 DIAGNOSIS — D649 Anemia, unspecified: Secondary | ICD-10-CM | POA: Diagnosis not present

## 2016-07-12 DIAGNOSIS — J8410 Pulmonary fibrosis, unspecified: Secondary | ICD-10-CM | POA: Diagnosis not present

## 2016-07-12 DIAGNOSIS — J449 Chronic obstructive pulmonary disease, unspecified: Secondary | ICD-10-CM | POA: Diagnosis not present

## 2016-07-12 DIAGNOSIS — Z23 Encounter for immunization: Secondary | ICD-10-CM | POA: Diagnosis not present

## 2016-07-12 DIAGNOSIS — I10 Essential (primary) hypertension: Secondary | ICD-10-CM | POA: Diagnosis not present

## 2016-07-14 ENCOUNTER — Telehealth: Payer: Self-pay | Admitting: Emergency Medicine

## 2016-07-14 MED ORDER — LEVOFLOXACIN 500 MG PO TABS: 500.0000 mg | ORAL_TABLET | Freq: Every day | ORAL | 0 refills | Status: DC

## 2016-07-14 MED ORDER — PREDNISONE 10 MG PO TABS
ORAL_TABLET | ORAL | 0 refills | Status: DC
Start: 2016-07-14 — End: 2016-08-04

## 2016-07-14 NOTE — Telephone Encounter (Signed)
Called and spoke to pt. Pt c/o prod cough with brown mucus - pt states the mucus is malodorous and increase in SOB x 2 days. Pt denies CP/tightness and f/c/s. Pt states she is taking pred 5mg  daily, Symbicort 160 2 puffs BID, and albuterol once daily. Pt is not using spiriva and seems confused about her medications.   Dr. please advise on pt's s/s. Thanks.

## 2016-07-14 NOTE — Telephone Encounter (Signed)
Spoke with the pt and notified of recs per RB  She verbalized understanding Nothing further needed Rxs sent to [facility]

## 2016-07-14 NOTE — Telephone Encounter (Signed)
I dont see that she every did the sputum culture. Still want her to do this.   Give her pred taper > Take 40mg  daily for 3 days, then 30mg  daily for 3 days, then 20mg  daily for 3 days, then 10mg  daily for 3 days, then stop Give her levaquin 500mg  qd x 7 days.

## 2016-07-15 ENCOUNTER — Telehealth: Payer: Self-pay | Admitting: Emergency Medicine

## 2016-07-15 NOTE — Telephone Encounter (Signed)
RB aware pt on Humira  Can keep taking levaquin  Pt aware

## 2016-07-15 NOTE — Telephone Encounter (Signed)
Spoke with the pt  She states that someone from the drug company from Humira called her to make sure that RB is aware that she is on Humira and make sure it is okay to take levaquin while on Humira  I advised will check with RB and call her back

## 2016-08-03 ENCOUNTER — Inpatient Hospital Stay (HOSPITAL_COMMUNITY)
Admission: EM | Admit: 2016-08-03 | Discharge: 2016-08-05 | DRG: 193 | Discharge status: Against Medical Advice | Payer: Medicare HMO | Attending: Family Medicine | Admitting: Family Medicine

## 2016-08-03 ENCOUNTER — Encounter (HOSPITAL_COMMUNITY): Payer: Self-pay | Admitting: Emergency Medicine

## 2016-08-03 DIAGNOSIS — Z90710 Acquired absence of both cervix and uterus: Secondary | ICD-10-CM | POA: Diagnosis not present

## 2016-08-03 DIAGNOSIS — R0600 Dyspnea, unspecified: Secondary | ICD-10-CM | POA: Diagnosis present

## 2016-08-03 DIAGNOSIS — J440 Chronic obstructive pulmonary disease with acute lower respiratory infection: Secondary | ICD-10-CM | POA: Diagnosis not present

## 2016-08-03 DIAGNOSIS — J181 Lobar pneumonia, unspecified organism: Secondary | ICD-10-CM | POA: Diagnosis not present

## 2016-08-03 DIAGNOSIS — Z7951 Long term (current) use of inhaled steroids: Secondary | ICD-10-CM

## 2016-08-03 DIAGNOSIS — Z87891 Personal history of nicotine dependence: Secondary | ICD-10-CM

## 2016-08-03 DIAGNOSIS — Z800 Family history of malignant neoplasm of digestive organs: Secondary | ICD-10-CM

## 2016-08-03 DIAGNOSIS — D509 Iron deficiency anemia, unspecified: Secondary | ICD-10-CM | POA: Diagnosis present

## 2016-08-03 DIAGNOSIS — R0602 Shortness of breath: Secondary | ICD-10-CM | POA: Diagnosis not present

## 2016-08-03 DIAGNOSIS — J1100 Influenza due to unidentified influenza virus with unspecified type of pneumonia: Principal | ICD-10-CM | POA: Diagnosis present

## 2016-08-03 DIAGNOSIS — Z7982 Long term (current) use of aspirin: Secondary | ICD-10-CM | POA: Diagnosis not present

## 2016-08-03 DIAGNOSIS — M069 Rheumatoid arthritis, unspecified: Secondary | ICD-10-CM | POA: Diagnosis present

## 2016-08-03 DIAGNOSIS — J8410 Pulmonary fibrosis, unspecified: Secondary | ICD-10-CM | POA: Diagnosis not present

## 2016-08-03 DIAGNOSIS — Z9981 Dependence on supplemental oxygen: Secondary | ICD-10-CM | POA: Diagnosis not present

## 2016-08-03 DIAGNOSIS — Z7952 Long term (current) use of systemic steroids: Secondary | ICD-10-CM | POA: Diagnosis not present

## 2016-08-03 DIAGNOSIS — I129 Hypertensive chronic kidney disease with stage 1 through stage 4 chronic kidney disease, or unspecified chronic kidney disease: Secondary | ICD-10-CM | POA: Diagnosis present

## 2016-08-03 DIAGNOSIS — Z66 Do not resuscitate: Secondary | ICD-10-CM | POA: Diagnosis present

## 2016-08-03 DIAGNOSIS — Z79899 Other long term (current) drug therapy: Secondary | ICD-10-CM

## 2016-08-03 DIAGNOSIS — J189 Pneumonia, unspecified organism: Secondary | ICD-10-CM | POA: Diagnosis present

## 2016-08-03 DIAGNOSIS — Z8249 Family history of ischemic heart disease and other diseases of the circulatory system: Secondary | ICD-10-CM | POA: Diagnosis not present

## 2016-08-03 DIAGNOSIS — I10 Essential (primary) hypertension: Secondary | ICD-10-CM | POA: Diagnosis present

## 2016-08-03 DIAGNOSIS — J9621 Acute and chronic respiratory failure with hypoxia: Secondary | ICD-10-CM | POA: Diagnosis not present

## 2016-08-03 DIAGNOSIS — N183 Chronic kidney disease, stage 3 unspecified: Secondary | ICD-10-CM | POA: Diagnosis present

## 2016-08-03 DIAGNOSIS — Z825 Family history of asthma and other chronic lower respiratory diseases: Secondary | ICD-10-CM | POA: Diagnosis not present

## 2016-08-03 DIAGNOSIS — J441 Chronic obstructive pulmonary disease with (acute) exacerbation: Secondary | ICD-10-CM | POA: Diagnosis not present

## 2016-08-03 HISTORY — DX: Chronic obstructive pulmonary disease, unspecified: J44.9

## 2016-08-03 NOTE — ED Triage Notes (Signed)
Brought in by EMS from home with c/o persistent shortness of breath.  Pt reported that she has called EMS earlier at around 1900 and was given treatment at the scene--- pt refused transfer to ED.  Pt's shortness of breath has persisted and seemed like it has gotten worse--- called EMS again.. Pt was given Dou-Neb x 2 and Solu- Medrol 125 mg IV en route.

## 2016-08-04 ENCOUNTER — Encounter (HOSPITAL_COMMUNITY): Payer: Self-pay | Admitting: Family Medicine

## 2016-08-04 ENCOUNTER — Emergency Department (HOSPITAL_COMMUNITY): Payer: Medicare HMO

## 2016-08-04 DIAGNOSIS — D509 Iron deficiency anemia, unspecified: Secondary | ICD-10-CM | POA: Diagnosis present

## 2016-08-04 DIAGNOSIS — I129 Hypertensive chronic kidney disease with stage 1 through stage 4 chronic kidney disease, or unspecified chronic kidney disease: Secondary | ICD-10-CM | POA: Diagnosis present

## 2016-08-04 DIAGNOSIS — I10 Essential (primary) hypertension: Secondary | ICD-10-CM

## 2016-08-04 DIAGNOSIS — J1100 Influenza due to unidentified influenza virus with unspecified type of pneumonia: Secondary | ICD-10-CM | POA: Diagnosis present

## 2016-08-04 DIAGNOSIS — J181 Lobar pneumonia, unspecified organism: Secondary | ICD-10-CM | POA: Diagnosis not present

## 2016-08-04 DIAGNOSIS — R0600 Dyspnea, unspecified: Secondary | ICD-10-CM | POA: Diagnosis present

## 2016-08-04 DIAGNOSIS — M069 Rheumatoid arthritis, unspecified: Secondary | ICD-10-CM | POA: Diagnosis present

## 2016-08-04 DIAGNOSIS — J9621 Acute and chronic respiratory failure with hypoxia: Secondary | ICD-10-CM | POA: Diagnosis present

## 2016-08-04 DIAGNOSIS — J441 Chronic obstructive pulmonary disease with (acute) exacerbation: Secondary | ICD-10-CM | POA: Diagnosis present

## 2016-08-04 DIAGNOSIS — Z7952 Long term (current) use of systemic steroids: Secondary | ICD-10-CM | POA: Diagnosis not present

## 2016-08-04 DIAGNOSIS — Z7951 Long term (current) use of inhaled steroids: Secondary | ICD-10-CM | POA: Diagnosis not present

## 2016-08-04 DIAGNOSIS — J189 Pneumonia, unspecified organism: Secondary | ICD-10-CM | POA: Diagnosis present

## 2016-08-04 DIAGNOSIS — Z90710 Acquired absence of both cervix and uterus: Secondary | ICD-10-CM | POA: Diagnosis not present

## 2016-08-04 DIAGNOSIS — R0602 Shortness of breath: Secondary | ICD-10-CM | POA: Diagnosis not present

## 2016-08-04 DIAGNOSIS — Z66 Do not resuscitate: Secondary | ICD-10-CM | POA: Diagnosis present

## 2016-08-04 DIAGNOSIS — N183 Chronic kidney disease, stage 3 unspecified: Secondary | ICD-10-CM | POA: Diagnosis present

## 2016-08-04 DIAGNOSIS — Z87891 Personal history of nicotine dependence: Secondary | ICD-10-CM | POA: Diagnosis not present

## 2016-08-04 DIAGNOSIS — Z7982 Long term (current) use of aspirin: Secondary | ICD-10-CM | POA: Diagnosis not present

## 2016-08-04 DIAGNOSIS — Z79899 Other long term (current) drug therapy: Secondary | ICD-10-CM | POA: Diagnosis not present

## 2016-08-04 DIAGNOSIS — Z9981 Dependence on supplemental oxygen: Secondary | ICD-10-CM | POA: Diagnosis not present

## 2016-08-04 DIAGNOSIS — J8410 Pulmonary fibrosis, unspecified: Secondary | ICD-10-CM | POA: Diagnosis present

## 2016-08-04 DIAGNOSIS — Z8249 Family history of ischemic heart disease and other diseases of the circulatory system: Secondary | ICD-10-CM | POA: Diagnosis not present

## 2016-08-04 DIAGNOSIS — Z825 Family history of asthma and other chronic lower respiratory diseases: Secondary | ICD-10-CM | POA: Diagnosis not present

## 2016-08-04 DIAGNOSIS — J440 Chronic obstructive pulmonary disease with acute lower respiratory infection: Secondary | ICD-10-CM | POA: Diagnosis present

## 2016-08-04 DIAGNOSIS — Z800 Family history of malignant neoplasm of digestive organs: Secondary | ICD-10-CM | POA: Diagnosis not present

## 2016-08-04 LAB — BLOOD GAS, ARTERIAL
ACID-BASE DEFICIT: 3.6 mmol/L — AB (ref 0.0–2.0)
BICARBONATE: 19.8 mmol/L — AB (ref 20.0–28.0)
Drawn by: 413081
O2 CONTENT: 8 L/min
O2 SAT: 82.4 %
PATIENT TEMPERATURE: 100.9
PO2 ART: 53.8 mmHg — AB (ref 83.0–108.0)
pCO2 arterial: 33.8 mmHg (ref 32.0–48.0)
pH, Arterial: 7.394 (ref 7.350–7.450)

## 2016-08-04 LAB — CBC WITH DIFFERENTIAL/PLATELET
Basophils Absolute: 0 10*3/uL (ref 0.0–0.1)
Basophils Relative: 0 %
Eosinophils Absolute: 0 10*3/uL (ref 0.0–0.7)
Eosinophils Relative: 0 %
HCT: 37.5 % (ref 36.0–46.0)
Hemoglobin: 11.1 g/dL — ABNORMAL LOW (ref 12.0–15.0)
LYMPHS ABS: 1.5 10*3/uL (ref 0.7–4.0)
Lymphocytes Relative: 15 %
MCH: 20.8 pg — AB (ref 26.0–34.0)
MCHC: 29.6 g/dL — ABNORMAL LOW (ref 30.0–36.0)
MCV: 70.4 fL — AB (ref 78.0–100.0)
Monocytes Absolute: 0.4 10*3/uL (ref 0.1–1.0)
Monocytes Relative: 4 %
Neutro Abs: 8.5 10*3/uL — ABNORMAL HIGH (ref 1.7–7.7)
Neutrophils Relative %: 81 %
PLATELETS: 154 10*3/uL (ref 150–400)
RBC: 5.33 MIL/uL — AB (ref 3.87–5.11)
RDW: 19.3 % — AB (ref 11.5–15.5)
WBC: 10.4 10*3/uL (ref 4.0–10.5)

## 2016-08-04 LAB — COMPREHENSIVE METABOLIC PANEL
ALT: 9 U/L — ABNORMAL LOW (ref 14–54)
AST: 26 U/L (ref 15–41)
Albumin: 2.8 g/dL — ABNORMAL LOW (ref 3.5–5.0)
Alkaline Phosphatase: 49 U/L (ref 38–126)
Anion gap: 10 (ref 5–15)
BUN: 24 mg/dL — ABNORMAL HIGH (ref 6–20)
CHLORIDE: 108 mmol/L (ref 101–111)
CO2: 18 mmol/L — AB (ref 22–32)
Calcium: 8 mg/dL — ABNORMAL LOW (ref 8.9–10.3)
Creatinine, Ser: 1.61 mg/dL — ABNORMAL HIGH (ref 0.44–1.00)
GFR, EST AFRICAN AMERICAN: 34 mL/min — AB (ref >60)
GFR, EST NON AFRICAN AMERICAN: 29 mL/min — AB (ref >60)
Glucose, Bld: 112 mg/dL — ABNORMAL HIGH (ref 65–99)
POTASSIUM: 3.9 mmol/L (ref 3.5–5.1)
SODIUM: 136 mmol/L (ref 135–145)
Total Bilirubin: 1.1 mg/dL (ref 0.3–1.2)
Total Protein: 6.2 g/dL — ABNORMAL LOW (ref 6.5–8.1)

## 2016-08-04 LAB — INFLUENZA PANEL BY PCR (TYPE A & B)
INFLAPCR: POSITIVE — AB
Influenza B By PCR: NEGATIVE

## 2016-08-04 LAB — URINALYSIS, ROUTINE W REFLEX MICROSCOPIC
Bilirubin Urine: NEGATIVE
GLUCOSE, UA: 50 mg/dL — AB
KETONES UR: NEGATIVE mg/dL
Leukocytes, UA: NEGATIVE
NITRITE: NEGATIVE
Protein, ur: >=300 mg/dL — AB
Specific Gravity, Urine: 1.028 (ref 1.005–1.030)
pH: 5 (ref 5.0–8.0)

## 2016-08-04 LAB — STREP PNEUMONIAE URINARY ANTIGEN: STREP PNEUMO URINARY ANTIGEN: NEGATIVE

## 2016-08-04 LAB — BRAIN NATRIURETIC PEPTIDE: B NATRIURETIC PEPTIDE 5: 256.8 pg/mL — AB (ref 0.0–100.0)

## 2016-08-04 LAB — PROCALCITONIN: Procalcitonin: 2.52 ng/mL

## 2016-08-04 MED ORDER — AZITHROMYCIN 250 MG PO TABS
250.0000 mg | ORAL_TABLET | ORAL | Status: DC
  Administered 2016-08-04: 250 mg via ORAL
  Filled 2016-08-04: qty 1

## 2016-08-04 MED ORDER — IPRATROPIUM BROMIDE 0.02 % IN SOLN: 0.5000 mg | Freq: Four times a day (QID) | RESPIRATORY_TRACT | Status: DC

## 2016-08-04 MED ORDER — PREDNISONE 20 MG PO TABS
40.0000 mg | ORAL_TABLET | Freq: Every day | ORAL | Status: DC
  Administered 2016-08-04: 40 mg via ORAL
  Filled 2016-08-04: qty 2

## 2016-08-04 MED ORDER — ACETAMINOPHEN 325 MG PO TABS
325.0000 mg | ORAL_TABLET | Freq: Once | ORAL | Status: AC
  Administered 2016-08-04: 325 mg via ORAL
  Filled 2016-08-04: qty 1

## 2016-08-04 MED ORDER — SODIUM CHLORIDE 0.9 % IV SOLN
INTRAVENOUS | Status: DC
  Administered 2016-08-04 (×2): via INTRAVENOUS

## 2016-08-04 MED ORDER — OSELTAMIVIR PHOSPHATE 30 MG PO CAPS
30.0000 mg | ORAL_CAPSULE | Freq: Two times a day (BID) | ORAL | Status: DC
  Administered 2016-08-04: 30 mg via ORAL
  Filled 2016-08-04 (×2): qty 1

## 2016-08-04 MED ORDER — ALBUTEROL SULFATE (2.5 MG/3ML) 0.083% IN NEBU: 2.5000 mg | INHALATION_SOLUTION | RESPIRATORY_TRACT | Status: DC | PRN

## 2016-08-04 MED ORDER — DEXTROSE 5 % IV SOLN
2.0000 g | Freq: Once | INTRAVENOUS | Status: AC
  Administered 2016-08-04: 2 g via INTRAVENOUS
  Filled 2016-08-04: qty 2

## 2016-08-04 MED ORDER — ACETAMINOPHEN 325 MG PO TABS
650.0000 mg | ORAL_TABLET | Freq: Once | ORAL | Status: AC
  Administered 2016-08-04: 650 mg via ORAL
  Filled 2016-08-04: qty 2

## 2016-08-04 MED ORDER — ACETAMINOPHEN 650 MG RE SUPP: 650.0000 mg | Freq: Four times a day (QID) | RECTAL | Status: DC | PRN

## 2016-08-04 MED ORDER — ALBUTEROL SULFATE (2.5 MG/3ML) 0.083% IN NEBU: 2.5000 mg | INHALATION_SOLUTION | Freq: Four times a day (QID) | RESPIRATORY_TRACT | Status: DC

## 2016-08-04 MED ORDER — ONDANSETRON HCL 4 MG PO TABS: 4.0000 mg | ORAL_TABLET | Freq: Four times a day (QID) | ORAL | Status: DC | PRN

## 2016-08-04 MED ORDER — OSELTAMIVIR PHOSPHATE 75 MG PO CAPS
75.0000 mg | ORAL_CAPSULE | Freq: Once | ORAL | Status: AC
  Administered 2016-08-04: 75 mg via ORAL
  Filled 2016-08-04: qty 1

## 2016-08-04 MED ORDER — ENOXAPARIN SODIUM 40 MG/0.4ML ~~LOC~~ SOLN
40.0000 mg | SUBCUTANEOUS | Status: DC
  Filled 2016-08-04: qty 0.4

## 2016-08-04 MED ORDER — PHENOL 1.4 % MT LIQD
1.0000 | OROMUCOSAL | Status: DC | PRN
  Administered 2016-08-04: 1 via OROMUCOSAL
  Filled 2016-08-04: qty 177

## 2016-08-04 MED ORDER — IPRATROPIUM-ALBUTEROL 0.5-2.5 (3) MG/3ML IN SOLN
3.0000 mL | Freq: Four times a day (QID) | RESPIRATORY_TRACT | Status: DC
  Administered 2016-08-04 – 2016-08-05 (×4): 3 mL via RESPIRATORY_TRACT
  Filled 2016-08-04 (×5): qty 3

## 2016-08-04 MED ORDER — ACETAMINOPHEN 325 MG PO TABS: 650.0000 mg | ORAL_TABLET | Freq: Four times a day (QID) | ORAL | Status: DC | PRN

## 2016-08-04 MED ORDER — METHYLPREDNISOLONE SODIUM SUCC 125 MG IJ SOLR: 125.0000 mg | Freq: Once | INTRAMUSCULAR | Status: DC

## 2016-08-04 MED ORDER — ALBUTEROL (5 MG/ML) CONTINUOUS INHALATION SOLN
10.0000 mg/h | INHALATION_SOLUTION | Freq: Once | RESPIRATORY_TRACT | Status: AC
  Administered 2016-08-04: 10 mg/h via RESPIRATORY_TRACT
  Filled 2016-08-04: qty 20

## 2016-08-04 MED ORDER — DEXTROSE 5 % IV SOLN
500.0000 mg | Freq: Once | INTRAVENOUS | Status: AC
  Administered 2016-08-04: 500 mg via INTRAVENOUS
  Filled 2016-08-04: qty 500

## 2016-08-04 MED ORDER — MOMETASONE FURO-FORMOTEROL FUM 200-5 MCG/ACT IN AERO
2.0000 | INHALATION_SPRAY | Freq: Two times a day (BID) | RESPIRATORY_TRACT | Status: DC
  Administered 2016-08-04 (×2): 2 via RESPIRATORY_TRACT
  Filled 2016-08-04: qty 8.8

## 2016-08-04 MED ORDER — ASPIRIN 81 MG PO CHEW
81.0000 mg | CHEWABLE_TABLET | Freq: Every day | ORAL | Status: DC
  Administered 2016-08-04: 81 mg via ORAL
  Filled 2016-08-04: qty 1

## 2016-08-04 MED ORDER — ONDANSETRON HCL 4 MG/2ML IJ SOLN: 4.0000 mg | Freq: Four times a day (QID) | INTRAMUSCULAR | Status: DC | PRN

## 2016-08-04 MED ORDER — OSELTAMIVIR PHOSPHATE 75 MG PO CAPS: 75.0000 mg | ORAL_CAPSULE | Freq: Two times a day (BID) | ORAL | Status: DC

## 2016-08-04 MED ORDER — CEFTRIAXONE SODIUM 1 G IJ SOLR
1.0000 g | INTRAMUSCULAR | Status: DC
  Administered 2016-08-04: 1 g via INTRAVENOUS
  Filled 2016-08-04: qty 10

## 2016-08-04 NOTE — ED Notes (Signed)
Pt refused blood draw for lactic acid plasma---- stated, "I am not giving anymore blood".

## 2016-08-04 NOTE — Progress Notes (Addendum)
Pt seen and examined, admitted earlier this am by Dr.Danford Jeralene Peters is a 82 y.o. female with a past medical history significant for RA on Humira, interstitial lung disease and  COPD on 3L home O2, HTN, and CKD3 admitted with fever/chills/cough/dyspnea, CXR with Left lung opacity. -Flu CPR positive, started Tamiflu -continue Empiric CAP therapy too -on prednisone taper from before, now at 40mg  daily -Wean O2 as tolerated,   , MD

## 2016-08-04 NOTE — ED Notes (Signed)
Pt remained firm and steadfast on her refusal to have her blood drawn for LACTIC ACID.  Dr. Maryfrances Bunnell was made aware.

## 2016-08-04 NOTE — H&P (Signed)
History and Physical  Patient Name: [name]     OYD:[id number]    DOB: 1935/04/24    DOA: 08/03/2016 PCP: Clayborn Heron, MD   Patient coming from: Home via EMS  Chief Complaint: Dyspnea  HPI: [name] is a 82 y.o. female with a past medical history significant for RA on Humira c/b mild interstitial lung disease, COPD on 3L home O2, HTN, and CKD III who presents with few days worsening dyspnea.  The patient was in her usual state of health until about 2 days ago when she started to have worsening of her chronic shortness of breath, worsening of her cough and severe wheezing not relieved with her home nebulizers. She called EMS today who came out to the house and gave her a nebulizer, and she declined transport. However over the afternoon and evening she felt much worse and so called EMS again, who brought her in in respiratory distress and gave Solu-medrol 125 mg en route and nebs.  ED course: -Febrile to 101.74F, heart rate 122, respirations 24, pulse oximetry 85% on home O2, blood pressure 150/82 -Sodium 136, potassium 3.9, creatinine 1.6 (baseline 1.3-1.4), WBC 10.4 K, hemoglobin 11.1, microcytic -BNP 256 -ABG showed pH 7.39, PCO2 33, PO2 53 -CXR showed her chronic interstitial lung disease, plus a new left opacity suspicious for pneumonia -Blood cultures were obtained, ceftriaxone and azithromycin were administered, and TRH was asked to evaluate for acute hypoxic respiratory failure from pneumonia    She is followed by Dr. Shelle Iron, now by Dr. Delton Coombes for her ILD and COPD.  On Symbicort and pred daily.  Last two flares requiring steroids tapers were in Sept 2017 then mid Dec 2017.  Got Levaquin both times.  She is followed by Dr. Kellie Simmering for RA and Humira.     ROS: Review of Systems  Constitutional: Positive for diaphoresis and malaise/fatigue. Negative for chills and fever.  HENT: Negative for congestion and sore throat.   Respiratory: Positive for cough, shortness  of breath and wheezing. Negative for sputum production.   Gastrointestinal: Negative for diarrhea, nausea and vomiting.  Genitourinary: Negative for dysuria (but new incontinence of urine), frequency, hematuria and urgency.  Musculoskeletal: Positive for joint pain (chronic, from RA). Negative for myalgias.  All other systems reviewed and are negative.         Past Medical History:  Diagnosis Date  . Emphysema   . Hypertension   . Interstitial lung disease (H[facility])   . On home O2    2L N/C  . Osteopenia   . Psoriasis   . Pthirus inguinalis   . Rheumatoid arthritis(714.0)   . Vertigo     Past Surgical History:  Procedure Laterality Date  . CHOLECYSTECTOMY    . VESICOVAGINAL FISTULA CLOSURE W/ TAH  1962    Social History: Patient lives with her husband.  The patient walks with a walker.  Only gets out of house about once per month, to [place], uses a motorized chair.  Former smoker, quit >10 years ago.  Was a homemaker. From [place], [place], lived much of her life in [place], now back in [place].    No Known Allergies  Family history: family history includes Asthma in her sister; Colon cancer in her sister; Heart disease in her sister.  Prior to Admission medications   Medication Sig Start Date End Date Taking? Authorizing Provider  Adalimumab (HUMIRA PEN [place]) Inject 40 mg into the skin every 14 (fourteen) days. As directed  Yes Historical Provider, MD  albuterol (PROVENTIL HFA;VENTOLIN HFA) 108 (90 Base) MCG/ACT inhaler Inhale 2 puffs into the lungs every 6 (six) hours as needed for wheezing or shortness of breath. 04/21/16  Yes Oretha Milch, MD  aspirin 81 MG tablet Take 81 mg by mouth daily.    Yes Historical Provider, MD  budesonide-formoterol (SYMBICORT) 160-4.5 MCG/ACT inhaler Inhale 2 puffs into the lungs 2 (two) times daily. 04/19/16  Yes Leslye Peer, MD  clobetasol cream (TEMOVATE) 0.05 % Apply 1 application topically daily as needed. For psoriasis   Yes Historical Provider,  MD  desonide (DESOWEN) 0.05 % ointment Apply 1 application topically daily as needed. For psoriasis   Yes Historical Provider, MD  diclofenac sodium (VOLTAREN) 1 % GEL Apply 2 g topically See admin instructions. Once every three days as needed for rheumatoid arthritis   Yes Historical Provider, MD  folic acid (FOLVITE) 800 MCG tablet Take 800 mcg by mouth daily.    Yes Historical Provider, MD  Multiple Vitamins-Minerals (MULTIVITAMIN & MINERAL PO) Take 1 tablet by mouth daily.   Yes Historical Provider, MD  niacin 500 MG tablet Take 500 mg by mouth daily.    Yes Historical Provider, MD  Tiotropium Bromide Monohydrate (SPIRIVA RESPIMAT) 2.5 MCG/ACT AERS 2 puffs once a day 05/29/15 08/04/16 Yes Leslye Peer, MD  amLODipine (NORVASC) 10 MG tablet Take 1 tablet by mouth daily. 06/13/16   Historical Provider, MD  furosemide (LASIX) 40 MG tablet Take 1 tablet by mouth daily. 05/30/16   Historical Provider, MD  levofloxacin (LEVAQUIN) 500 MG tablet Take 1 tablet (500 mg total) by mouth daily. 07/14/16   Leslye Peer, MD  predniSONE (DELTASONE) 10 MG tablet 4 x 3 days, 3 x 3 days, 2 x 3 days, 1 x 3 days, then stop 07/14/16   Leslye Peer, MD  predniSONE (DELTASONE) 5 MG tablet Take 1 tablet (5 mg total) by mouth daily. 01/22/16   Leslye Peer, MD       Physical Exam: BP 116/65   Pulse 95   Temp 101.1 F (38.4 C) (Oral)   Resp 22   SpO2 (!) 87%  General appearance: Frail elderly adult female, alert and in mild respiratory distress, increased respiratory rate, appears tired.   Eyes: Anicteric, conjunctiva pink, lids and lashes normal. PERRL.   ENT: No nasal deformity, discharge, epistaxis.  Hearing normal. OP with dry MM without lesions.   Neck: No neck masses.  Trachea midline.  No thyromegaly/tenderness. Lymph: No cervical or supraclavicular lymphadenopathy. Skin: Warm and dry.  No suspicious rashes or lesions. Cardiac: Tachycardic, regular, nl S1-S2, no murmurs appreciated.  Capillary  refill is brisk.  No JVD.  No LE edema.  Radial and DP pulses 2+ and symmetric. Respiratory: Tachypneic, desats while talking, does not appear breathless.  Wheezes with expiration diffusely, diminished throughout. Abdomen: Abdomen soft.  No TTP. No ascites, distension, hepatosplenomegaly.   MSK: No deformities or effusions.  No cyanosis.  Clubbing noted. Neuro: Cranial nerves 3-12 intact.  Sensation intact to light touch. Speech is fluent.  Muscle strength 4+/5 and symmetric, globally weak.    Psych: Sensorium intact and responding to questions, attention normal.  Behavior appropriate.  Affect normal.  Judgment and insight appear normal.     Labs on Admission:  I have personally reviewed following labs and imaging studies: CBC:  Recent Labs Lab 08/04/16 0027  WBC 10.4  NEUTROABS 8.5*  HGB 11.1*  HCT 37.5  MCV 70.4*  PLT 154   Basic Metabolic Panel:  Recent Labs Lab 08/04/16 0027  NA 136  K 3.9  CL 108  CO2 18*  GLUCOSE 112*  BUN 24*  CREATININE 1.61*  CALCIUM 8.0*   GFR: CrCl cannot be calculated (Unknown ideal weight.).  Liver Function Tests:  Recent Labs Lab 08/04/16 0027  AST 26  ALT 9*  ALKPHOS 49  BILITOT 1.1  PROT 6.2*  ALBUMIN 2.8*   No results for input(s): LIPASE, AMYLASE in the last 168 hours. No results for input(s): AMMONIA in the last 168 hours. Coagulation Profile: No results for input(s): INR, PROTIME in the last 168 hours. Cardiac Enzymes: No results for input(s): CKTOTAL, CKMB, CKMBINDEX, TROPONINI in the last 168 hours. BNP (last 3 results) No results for input(s): PROBNP in the last 8760 hours. HbA1C: No results for input(s): HGBA1C in the last 72 hours. CBG: No results for input(s): GLUCAP in the last 168 hours. Lipid Profile: No results for input(s): CHOL, HDL, LDLCALC, TRIG, CHOLHDL, LDLDIRECT in the last 72 hours. Thyroid Function Tests: No results for input(s): TSH, T4TOTAL, FREET4, T3FREE, THYROIDAB in the last 72  hours. Anemia Panel: No results for input(s): VITAMINB12, FOLATE, FERRITIN, TIBC, IRON, RETICCTPCT in the last 72 hours. Sepsis Labs: Lactate declined. Invalid input(s): PROCALCITONIN, LACTICIDVEN No results found for this or any previous visit (from the past 240 hour(s)).       Radiological Exams on Admission: Personally reviewed CXR shows chronic interstitial changes, suspicion for L base opacity: Dg Chest Port 1 View  Result Date: 08/04/2016 CLINICAL DATA:  Short of breath with fever EXAM: PORTABLE CHEST 1 VIEW COMPARISON:  04/21/2016 FINDINGS: Diffuse coarse interstitial pattern compatible with chronic interstitial changes. Mild increased opacity at the left base could reflect atelectasis. Suspect tiny left effusion. Stable mild cardiomegaly with atherosclerosis. No pneumothorax. IMPRESSION: 1. Diffuse coarse interstitial pattern suggests chronic fibro interstitial changes. 2. Mild increased left basilar opacity could reflect atelectasis or a mild infiltrate. Electronically Signed   By: Jasmine Pang M.D.   On: 08/04/2016 00:37    EKG: Independently reviewed. Rate 123, QTc 480, many PACs.      Assessment/Plan  1. Acute on chronic respiratory failure with hypoxia:  This is multifactorial from her underlying interstitial lung disease, COPD exacerbation, possible pneumonia, possible influence. CHF doubted, but with a differential. -Prednisone 40 mg daily -Ceftriaxone and azithromycin -Oseltamivir and follow-up flu swab, discontinue if possible -Check Procalcitonin -Given recent Levaquin, Humira and long-term prednisone, low threshold to broaden antibiotics if fever persists -Albuterol and ipratropium every 6 hours and albuterol when necessary -Obtain sputum culture and target antibiotics if able -Check legionella and strep pneumo     2. Rheumatoid arthritis with ILD:  On Humira and daily low dose prednisone.   -Hold home prednisone while on burst/taper  3. COPD with  exacerbation:  -Continue home Symbicort as formulary alternative -Antibiotics, steroids and nebs as above  4. Microcytic anemia:  -Check iron studies and ferritin  5. CKD 3:  Stable, slightly worse than baseline 1.3-1.4. -Fluids overnight and trend BMP  6. Hypertension:  -Hold amlodipine and furosemide until hemodynamics clearer -Continue aspirin     DVT prophylaxis: Lovenox  Code Status: DO NOT RESUSCITATE  Family Communication: Daughter at bedside.  Overnight plan discussed, CODE STATUS confirmed.  Disposition Plan: Anticipate IV antibiotics, tamiflu and follow above studies.  Steroids and BDs and supportive respiratory cares. Consults called: None Admission status: INPATIENT, med surg        Medical  decision making: Patient seen at 3:15 AM on 08/04/2016.  The patient was discussed with Dr. Fayrene Fearing.  What exists of the patient's chart was reviewed in depth and summarized above.  Clinical condition: requiring supplemental O2 but currently stable on nasal cannula, no need for non-invasive ventilation at present, BP good, stable for medical floor.        Alberteen Sam [facility] Pager [phone]    At the time of admission, it appears that the appropriate admission status for this patient is INPATIENT. This is judged to be reasonable and necessary in order to provide the required intensity of service to ensure the patient's safety given the presenting symptoms, physical exam findings, and initial radiographic and laboratory data in the context of their chronic comorbidities.  Together, these circumstances are felt to place her at high risk for further clinical deterioration threatening life, limb, or organ.   Patient requires inpatient status due to high intensity of service, high risk for further deterioration and high frequency of surveillance required because of this severe exacerbation of their chronic organ failure.  I certify that at the point of admission  it is my clinical judgment that the patient will require inpatient hospital care spanning beyond 2 midnights from the point of admission and that early discharge would result in unnecessary risk of decompensation and readmission or threat to life, limb or bodily function.

## 2016-08-04 NOTE — Progress Notes (Signed)
Pharmacy - Brief Note (antimicrobial renal dose adjustment)  Tamiflu adjusted to 30mg  PO BID for CrCl 30-40ml/min  72m, PharmD, BCPS.   Pager: Juliette Alcide 08/04/2016 1:44 PM

## 2016-08-04 NOTE — ED Provider Notes (Signed)
[facility]-EMERGENCY DEPT Provider Note   CSN: [id number] Arrival date & time: 08/03/16  2349  By signing my name below, I, Vista Mink, attest that this documentation has been prepared under the direction and in the presence of Rolland Porter, MD. Electronically signed, Vista Mink, ED Scribe. 08/04/16. 12:10 AM.  History   Chief Complaint Chief Complaint  Patient presents with  . Shortness of Breath    HPI HPI Comments: HPI Comments: [name] is a 82 y.o. female with hx of Emphysema, HTN, COPD, brought in by ambulance, who presents to the Emergency Department complaining of worsening shortness of breath for the past 3 days. Pt reportedly called EMS earlier around 1900 and was given treatment at scene, pt refused transfer to ED at that time. Pt's shortness of breath continued to get worse and called EMS gain. Per EMS, was given Duo-neb x2 and Solumedrol 125 in route. Pt wears supplemental O2 at home, 2.5L via [place], she has also tried nebulizer treatments at home. She also reports a dry, non-productive cough recently. She denies any recent known fever but pt's temp 101.1 on arrival. Pt did not get a flu vaccination this season. No Hx of heart problems. Pt is followed by Dr. Shelle Iron with [facility].   The history is provided by the patient and the EMS personnel. No language interpreter was used.    Past Medical History:  Diagnosis Date  . COPD (chronic obstructive pulmonary disease) (HCC)   . Emphysema   . Hypertension   . Interstitial lung disease (HCC)   . On home O2    2L N/C  . Osteopenia   . Psoriasis   . Pthirus inguinalis   . Rheumatoid arthritis(714.0)   . Vertigo     Patient Active Problem List   Diagnosis Date Noted  . Rheumatoid arthritis (HCC) 08/04/2016  . Microcytic anemia 08/04/2016  . CKD (chronic kidney disease), stage III 08/04/2016  . Acute on chronic respiratory failure with hypoxia (HCC) 08/04/2016  . Community acquired pneumonia of left lower lobe of  lung (HCC) 08/04/2016  . COPD with exacerbation (HCC) 08/04/2016  . Multifocal atrial tachycardia (HCC) 03/29/2016  . COPD (chronic obstructive pulmonary disease) (HCC) 01/16/2008  . OBESITY HYPOVENTILATION SYNDROME 06/14/2007  . PULMONARY FIBROSIS 06/14/2007  . RESPIRATORY FAILURE, CHRONIC 06/14/2007  . Essential hypertension 02/26/2007  . OSTEOPENIA 02/26/2007    Past Surgical History:  Procedure Laterality Date  . CHOLECYSTECTOMY    . VESICOVAGINAL FISTULA CLOSURE W/ TAH  1962    OB History    No data available       Home Medications    Prior to Admission medications   Medication Sig Start Date End Date Taking? Authorizing Provider  albuterol (PROVENTIL HFA;VENTOLIN HFA) 108 (90 Base) MCG/ACT inhaler Inhale 2 puffs into the lungs every 6 (six) hours as needed for wheezing or shortness of breath. 04/21/16  Yes Oretha Milch, MD  amLODipine (NORVASC) 10 MG tablet Take 1 tablet by mouth daily. 06/13/16  Yes Historical Provider, MD  aspirin 81 MG tablet Take 81 mg by mouth daily.    Yes Historical Provider, MD  budesonide-formoterol (SYMBICORT) 160-4.5 MCG/ACT inhaler Inhale 2 puffs into the lungs 2 (two) times daily. 04/19/16  Yes Leslye Peer, MD  clobetasol cream (TEMOVATE) 0.05 % Apply 1 application topically daily as needed. For psoriasis   Yes Historical Provider, MD  desonide (DESOWEN) 0.05 % ointment Apply 1 application topically daily as needed. For psoriasis   Yes Historical Provider,  MD  diclofenac sodium (VOLTAREN) 1 % GEL Apply 2 g topically See admin instructions. Once every three days as needed for rheumatoid arthritis   Yes Historical Provider, MD  folic acid (FOLVITE) 800 MCG tablet Take 800 mcg by mouth daily.    Yes Historical Provider, MD  furosemide (LASIX) 40 MG tablet Take 1 tablet by mouth as needed for fluid.  05/30/16  Yes Historical Provider, MD  Multiple Vitamins-Minerals (MULTIVITAMIN & MINERAL PO) Take 1 tablet by mouth daily.   Yes Historical  Provider, MD  niacin 500 MG tablet Take 500 mg by mouth daily.    Yes Historical Provider, MD  predniSONE (DELTASONE) 5 MG tablet Take 1 tablet (5 mg total) by mouth daily. 01/22/16  Yes Leslye Peer, MD  Tiotropium Bromide Monohydrate (SPIRIVA RESPIMAT) 2.5 MCG/ACT AERS 2 puffs once a day 05/29/15 08/04/16 Yes Leslye Peer, MD  levofloxacin (LEVAQUIN) 500 MG tablet Take 1 tablet (500 mg total) by mouth daily. Patient not taking: Reported on 08/09/2016 08/05/16   Tyrone Nine, MD  oseltamivir (TAMIFLU) 30 MG capsule Take 1 capsule (30 mg total) by mouth 2 (two) times daily. 08/05/16   Tyrone Nine, MD  predniSONE (DELTASONE) 20 MG tablet Take 2 tablets (40 mg total) by mouth daily with breakfast. 08/05/16   Tyrone Nine, MD    Family History Family History  Problem Relation Age of Onset  . Heart disease Sister   . Colon cancer Sister   . Asthma Sister     Social History Social History  Substance Use Topics  . Smoking status: Former Smoker    Packs/day: 0.50    Years: 50.00    Types: Cigarettes    Quit date: 08/01/2000  . Smokeless tobacco: Never Used  . Alcohol use No     Allergies   Patient has no known allergies.   Review of Systems Review of Systems  Constitutional: Positive for fever (101.1 on arrival). Negative for appetite change, chills, diaphoresis and fatigue.  HENT: Negative for mouth sores, sore throat and trouble swallowing.   Eyes: Negative for visual disturbance.  Respiratory: Positive for cough, shortness of breath and wheezing. Negative for chest tightness.   Cardiovascular: Negative for chest pain.  Gastrointestinal: Negative for abdominal distention, abdominal pain, diarrhea, nausea and vomiting.  Endocrine: Negative for polydipsia, polyphagia and polyuria.  Genitourinary: Negative for dysuria, frequency and hematuria.  Musculoskeletal: Negative for gait problem.  Skin: Negative for color change, pallor and rash.  Neurological: Negative for dizziness, syncope,  light-headedness and headaches.  Hematological: Does not bruise/bleed easily.  Psychiatric/Behavioral: Negative for behavioral problems and confusion.    Physical Exam Updated Vital Signs BP (!) 127/58 (BP Location: Left Arm)   Pulse 92   Temp 97.6 F (36.4 C) (Oral)   Resp 20   Ht 5\' 8"  (1.727 m)   Wt 183 lb 3.2 oz (83.1 kg)   SpO2 92%   BMI 27.86 kg/m   Physical Exam  Constitutional: She is oriented to person, place, and time. She appears well-developed and well-nourished. No distress.  Temp 101.1  HENT:  Head: Normocephalic.  Eyes: Conjunctivae are normal. Pupils are equal, round, and reactive to light. No scleral icterus.  Neck: Normal range of motion. Neck supple. No thyromegaly present.  Cardiovascular: Normal rate and regular rhythm.  Exam reveals no gallop and no friction rub.   No murmur heard. Pulmonary/Chest: She is in respiratory distress. She has wheezes. She has no rales.  Mild respiratory  distress with accessory muscle use. Wheezing and prolongation in all lung fields.  Abdominal: Soft. Bowel sounds are normal. She exhibits no distension. There is no tenderness. There is no rebound.  Musculoskeletal: Normal range of motion.  Neurological: She is alert and oriented to person, place, and time.  Skin: Skin is warm and dry. No rash noted.  Psychiatric: She has a normal mood and affect. Her behavior is normal.     ED Treatments / Results  DIAGNOSTIC STUDIES: Oxygen Saturation is 87% on RA, normal by my interpretation.  COORDINATION OF CARE: 12:07 AM-Discussed treatment plan with pt at bedside and pt agreed to plan.   Labs (all labs ordered are listed, but only abnormal results are displayed) Labs Reviewed  URINE CULTURE - Abnormal; Notable for the following:       Result Value   Culture MULTIPLE SPECIES PRESENT, SUGGEST RECOLLECTION (*)    All other components within normal limits  CBC WITH DIFFERENTIAL/PLATELET - Abnormal; Notable for the following:     RBC 5.33 (*)    Hemoglobin 11.1 (*)    MCV 70.4 (*)    MCH 20.8 (*)    MCHC 29.6 (*)    RDW 19.3 (*)    Neutro Abs 8.5 (*)    All other components within normal limits  COMPREHENSIVE METABOLIC PANEL - Abnormal; Notable for the following:    CO2 18 (*)    Glucose, Bld 112 (*)    BUN 24 (*)    Creatinine, Ser 1.61 (*)    Calcium 8.0 (*)    Total Protein 6.2 (*)    Albumin 2.8 (*)    ALT 9 (*)    GFR calc non Af Amer 29 (*)    GFR calc Af Amer 34 (*)    All other components within normal limits  BRAIN NATRIURETIC PEPTIDE - Abnormal; Notable for the following:    B Natriuretic Peptide 256.8 (*)    All other components within normal limits  INFLUENZA PANEL BY PCR (TYPE A & B, H1N1) - Abnormal; Notable for the following:    Influenza A By PCR POSITIVE (*)    All other components within normal limits  BLOOD GAS, ARTERIAL - Abnormal; Notable for the following:    pO2, Arterial 53.8 (*)    Bicarbonate 19.8 (*)    Acid-base deficit 3.6 (*)    All other components within normal limits  URINALYSIS, ROUTINE W REFLEX MICROSCOPIC - Abnormal; Notable for the following:    Color, Urine AMBER (*)    APPearance CLOUDY (*)    Glucose, UA 50 (*)    Hgb urine dipstick SMALL (*)    Protein, ur >=300 (*)    Bacteria, UA RARE (*)    Squamous Epithelial / LPF 0-5 (*)    All other components within normal limits  CULTURE, BLOOD (ROUTINE X 2)  CULTURE, BLOOD (ROUTINE X 2)  STREP PNEUMONIAE URINARY ANTIGEN  PROCALCITONIN  LEGIONELLA PNEUMOPHILA SEROGP 1 UR AG    EKG  EKG Interpretation  Date/Time:  Thursday August 04 2016 00:20:10 EST Ventricular Rate:  123 PR Interval:    QRS Duration: 105 QT Interval:  335 QTC Calculation: 480 R Axis:   148 Text Interpretation:  Sinus tachycardia Multiform ventricular premature complexes Probable right ventricular hypertrophy Confirmed by Fayrene Fearing  MD, Americus Perkey ([id number]) on 08/04/2016 12:29:33 AM       Radiology No results found.  Procedures Procedures  (including critical care time)  Medications Ordered in ED Medications  albuterol (PROVENTIL,VENTOLIN) solution continuous neb (10 mg/hr Nebulization Given 08/04/16 0027)  acetaminophen (TYLENOL) tablet 650 mg (650 mg Oral Given 08/04/16 0046)  cefTRIAXone (ROCEPHIN) 2 g in dextrose 5 % 50 mL IVPB (0 g Intravenous Stopped 08/04/16 0205)  azithromycin (ZITHROMAX) 500 mg in dextrose 5 % 250 mL IVPB (0 mg Intravenous Stopped 08/04/16 0337)  acetaminophen (TYLENOL) tablet 325 mg (325 mg Oral Given 08/04/16 0310)  oseltamivir (TAMIFLU) capsule 75 mg (75 mg Oral Given 08/04/16 0307)     Initial Impression / Assessment and Plan / ED Course  I have reviewed the triage vital signs and the nursing notes.  Pertinent labs & imaging results that were available during my care of the patient were reviewed by me and considered in my medical decision making (see chart for details).       Final Clinical Impressions(s) / ED Diagnoses   Final diagnoses:  COPD exacerbation (HCC)    New Prescriptions Discharge Medication List as of 08/05/2016 10:01 AM    START taking these medications   Details  levofloxacin (LEVAQUIN) 500 MG tablet Take 1 tablet (500 mg total) by mouth daily., Starting Fri 08/05/2016, Normal    oseltamivir (TAMIFLU) 30 MG capsule Take 1 capsule (30 mg total) by mouth 2 (two) times daily., Starting Fri 08/05/2016, Normal    !! predniSONE (DELTASONE) 20 MG tablet Take 2 tablets (40 mg total) by mouth daily with breakfast., Starting Fri 08/05/2016, Normal     !! - Potential duplicate medications found. Please discuss with provider.    I personally performed the services described in this documentation, which was scribed in my presence. The recorded information has been reviewed and is accurate.     Rolland Porter, MD 08/19/16 [phone]

## 2016-08-05 ENCOUNTER — Encounter (HOSPITAL_COMMUNITY): Payer: Self-pay

## 2016-08-05 LAB — LEGIONELLA PNEUMOPHILA SEROGP 1 UR AG: L. PNEUMOPHILA SEROGP 1 UR AG: NEGATIVE

## 2016-08-05 LAB — URINE CULTURE

## 2016-08-05 MED ORDER — PREDNISONE 20 MG PO TABS: 40.0000 mg | ORAL_TABLET | Freq: Every day | ORAL | 0 refills | Status: DC

## 2016-08-05 MED ORDER — OSELTAMIVIR PHOSPHATE 30 MG PO CAPS: 30.0000 mg | ORAL_CAPSULE | Freq: Two times a day (BID) | ORAL | 0 refills | Status: DC

## 2016-08-05 MED ORDER — DIPHENHYDRAMINE HCL 25 MG PO CAPS
25.0000 mg | ORAL_CAPSULE | Freq: Four times a day (QID) | ORAL | Status: DC | PRN
  Administered 2016-08-05: 25 mg via ORAL
  Filled 2016-08-05: qty 1

## 2016-08-05 MED ORDER — LEVOFLOXACIN 500 MG PO TABS
500.0000 mg | ORAL_TABLET | Freq: Every day | ORAL | 0 refills | Status: DC
Start: 2016-08-05 — End: 2016-09-16

## 2016-08-05 NOTE — Discharge Summary (Signed)
Physician Discharge Summary  [name] ZDG:[id number] DOB: 12/20/1934 DOA: 08/03/2016  PCP: Clayborn Heron, MD  Admit date: 08/03/2016 Discharge date: 08/05/2016  Admitted From: Home Disposition: Home - PATIENT LEFT AGAINST MEDICAL ADVICE   Recommendations for Outpatient Follow-up:  1. Follow up with PCP in 1-2 weeks  Home Health: None Equipment/Devices: 5L O2 Discharge Condition: Not completely clinically stabilized.  CODE STATUS: Full Diet recommendation: Heart healthy.  Brief/Interim Summary:  [name] is a 82 y.o. female with a past medical history significant for RA on Humira c/b mild interstitial lung disease, COPD on 3L home O2, HTN, and CKD III who presents with few days worsening dyspnea.  The patient was in her usual state of health until about 2 days ago when she started to have worsening of her chronic shortness of breath, worsening of her cough and severe wheezing not relieved with her home nebulizers. She called EMS today who came out to the house and gave her a nebulizer, and she declined transport. However over the afternoon and evening she felt much worse and so called EMS again, who brought her in in respiratory distress and gave Solu-medrol 125 mg en route and nebs.  In the ED she was ebrile to 101.53F, heart rate 122, respirations 24, pulse oximetry 85% on home O2, blood pressure 150/82. Sodium 136, potassium 3.9, creatinine 1.6 (baseline 1.3-1.4), WBC 10.4 K, hemoglobin 11.1, microcytic. ABG showed pH 7.39, PCO2 33, PO2 53. CXR showed her chronic interstitial lung disease, plus a new left opacity suspicious for pneumonia. Blood cultures were obtained, ceftriaxone and azithromycin were administered, and TRH was asked to evaluate for acute hypoxic respiratory failure from pneumonia. Tamiflu was started for positive influenza swab. Her breathing improved over the next 24 hours and on the morning of 08/05/2016 she insisted on leaving despite still endorsing symptoms  of dyspnea requiring increased oxygen. After extensive discussions of risks and benefits of leaving the hospital, including the possibility of death and my medical opinion that she should remain in the hospital, the patient still decided to leave against that medical advice. She was deemed to have the mental capacity to make her medical decisions. She was provided prescriptions for levaquin, prednisone, and tamiflu as below.  Discharge Diagnoses:  Principal Problem:   Acute on chronic respiratory failure with hypoxia (HCC) Active Problems:   Essential hypertension   PULMONARY FIBROSIS   Rheumatoid arthritis (HCC)   Microcytic anemia   CKD (chronic kidney disease), stage III   Community acquired pneumonia of left lower lobe of lung (HCC)   COPD with exacerbation (HCC)  Acute on chronic respiratory failure with hypoxia: This is multifactorial from her underlying interstitial lung disease, pneumonia and influenza. COPD also possibly contributing. CHF doubted, but within differential. - Continue prednisone 40mg  until follow up (scheduled for 1/9 prior to discharge). - Continue abx  > transition to levaquin po because patient is leaving AMA.  - Continue to complete 5 days of tamiflu.  - Told to continue holding humira and follow up with rheumatology. - Continue breathing treatments around the clock.   Discharge Instructions Discharge Instructions    Discharge instructions    Complete by:  As directed    You were admitted for respiratory failure due to the flu and possibly pneumonia which made COPD even worse. It is strongly recommended that you remain in the hospital as you are not yet stable for discharge to home. The risks of you leaving against medical advice at this time  include acute worsening or inability to breathe, return to hospital, and possibly death. Please follow these recommendations:  - Take tamiflu twice daily until you run out of medications (4 more days) for the flu - Take  levaquin once daily for 6 more days for pneumonia - Take prednisone 40mg  every morning until you follow up with your PCP on Tuesday. Make sure to ask them about tapering this medication back to your regular daily dose. - Increase your home oxygen to 6 liters until you follow up with your doctor. - Do not take humira while you are ill - Return to the hospital immediately if your condition worsens.  - Follow up with your PCP on Tuesday  All prescriptions have been sent to your pharmacy.     Allergies as of 08/05/2016   No Known Allergies     Medication List    STOP taking these medications   HUMIRA PEN [place]     TAKE these medications   albuterol 108 (90 Base) MCG/ACT inhaler Commonly known as:  PROVENTIL HFA;VENTOLIN HFA Inhale 2 puffs into the lungs every 6 (six) hours as needed for wheezing or shortness of breath.   amLODipine 10 MG tablet Commonly known as:  NORVASC Take 1 tablet by mouth daily.   aspirin 81 MG tablet Take 81 mg by mouth daily.   budesonide-formoterol 160-4.5 MCG/ACT inhaler Commonly known as:  SYMBICORT Inhale 2 puffs into the lungs 2 (two) times daily.   clobetasol cream 0.05 % Commonly known as:  TEMOVATE Apply 1 application topically daily as needed. For psoriasis   desonide 0.05 % ointment Commonly known as:  DESOWEN Apply 1 application topically daily as needed. For psoriasis   diclofenac sodium 1 % Gel Commonly known as:  VOLTAREN Apply 2 g topically See admin instructions. Once every three days as needed for rheumatoid arthritis   folic acid 800 MCG tablet Commonly known as:  FOLVITE Take 800 mcg by mouth daily.   furosemide 40 MG tablet Commonly known as:  LASIX Take 1 tablet by mouth as needed for fluid.   levofloxacin 500 MG tablet Commonly known as:  LEVAQUIN Take 1 tablet (500 mg total) by mouth daily.   MULTIVITAMIN & MINERAL PO Take 1 tablet by mouth daily.   niacin 500 MG tablet Take 500 mg by mouth daily.   oseltamivir  30 MG capsule Commonly known as:  TAMIFLU Take 1 capsule (30 mg total) by mouth 2 (two) times daily.   predniSONE 5 MG tablet Commonly known as:  DELTASONE Take 1 tablet (5 mg total) by mouth daily. What changed:  Another medication with the same name was added. Make sure you understand how and when to take each.   predniSONE 20 MG tablet Commonly known as:  DELTASONE Take 2 tablets (40 mg total) by mouth daily with breakfast. What changed:  You were already taking a medication with the same name, and this prescription was added. Make sure you understand how and when to take each.   Tiotropium Bromide Monohydrate 2.5 MCG/ACT Aers Commonly known as:  SPIRIVA RESPIMAT 2 puffs once a day      Follow-up Information    10/03/2016, MD Follow up on 08/09/2016.   Specialty:  Family Medicine Contact information: [address] [place] [phone]          No Known Allergies  Consultations:  None  Procedures/Studies: Dg Chest Port 1 View  Result Date: 08/04/2016 CLINICAL DATA:  Short of  breath with fever EXAM: PORTABLE CHEST 1 VIEW COMPARISON:  04/21/2016 FINDINGS: Diffuse coarse interstitial pattern compatible with chronic interstitial changes. Mild increased opacity at the left base could reflect atelectasis. Suspect tiny left effusion. Stable mild cardiomegaly with atherosclerosis. No pneumothorax. IMPRESSION: 1. Diffuse coarse interstitial pattern suggests chronic fibro interstitial changes. 2. Mild increased left basilar opacity could reflect atelectasis or a mild infiltrate. Electronically Signed   By: Jasmine Pang M.D.   On: 08/04/2016 00:37     Subjective: Pt insists on going home because she has "a lot of issues," which include a sore throat, dry skin, terrible food here, and that she's not getting better very quickly. She reports breathing is better, denies chest pain or palpitations. After addressing each concern and how we can treat them in  the hospital, and warning that it is my medical opinion that she is not stable for discharge and may decompensate leading to readmission or possibly death, she is able to teach these back to me and still wants to go home. She has follow up on Tuesday.  Discharge Exam: Vitals:   08/04/16 2112 08/04/16 2122 08/05/16 0220 08/05/16 0405  BP:  124/69  (!) 127/58  Pulse:  98  92  Resp:  19  20  Temp:  98 F (36.7 C)  97.6 F (36.4 C)  TempSrc:  Oral  Oral  SpO2: 93% 97% 93% 92%  Weight:      Height:       General: Pt is alert, awake, not in acute distress Cardiovascular: RRR, no murmur, rub or gallop. No JVD. Respiratory: Distant breath sounds with slight scattered end-expiratory wheezing without prolongation. Nonlabored on 5L by nasal cannula.  Abdominal: Soft, NT, ND, bowel sounds + Extremities: no edema, no cyanosis  Labs: BNP (last 3 results)  Recent Labs  08/04/16 0027  BNP 256.8*   Basic Metabolic Panel:  Recent Labs Lab 08/04/16 0027  NA 136  K 3.9  CL 108  CO2 18*  GLUCOSE 112*  BUN 24*  CREATININE 1.61*  CALCIUM 8.0*   Liver Function Tests:  Recent Labs Lab 08/04/16 0027  AST 26  ALT 9*  ALKPHOS 49  BILITOT 1.1  PROT 6.2*  ALBUMIN 2.8*   CBC:  Recent Labs Lab 08/04/16 0027  WBC 10.4  NEUTROABS 8.5*  HGB 11.1*  HCT 37.5  MCV 70.4*  PLT 154   Urinalysis    Component Value Date/Time   COLORURINE AMBER (A) 08/04/2016 1111   APPEARANCEUR CLOUDY (A) 08/04/2016 1111   LABSPEC 1.028 08/04/2016 1111   PHURINE 5.0 08/04/2016 1111   GLUCOSEU 50 (A) 08/04/2016 1111   HGBUR SMALL (A) 08/04/2016 1111   BILIRUBINUR NEGATIVE 08/04/2016 1111   KETONESUR NEGATIVE 08/04/2016 1111   PROTEINUR >=300 (A) 08/04/2016 1111   UROBILINOGEN 1.0 06/06/2012 1203   NITRITE NEGATIVE 08/04/2016 1111   LEUKOCYTESUR NEGATIVE 08/04/2016 1111   Time coordinating discharge: Over 30 minutes  Hazeline Junker, MD  [facility] 08/05/2016, 8:18 AM Pager [phone]

## 2016-08-05 NOTE — Progress Notes (Signed)
Pt refused labs this AM.

## 2016-08-05 NOTE — Progress Notes (Signed)
Patient stating that she wants to leave AMA.  Dr. Jarvis Newcomer notified.  Patient refusing to stay and speak with the MD.  Patient advised of the risks of leaving the hospital, and has answered all orientation questions appropriately.  Patient states the reason that she is leaving is that she feels like she is getting worse.  Patient signed forms to leave hospital against medical advice.

## 2016-08-09 ENCOUNTER — Encounter: Payer: Self-pay | Admitting: Emergency Medicine

## 2016-08-09 ENCOUNTER — Ambulatory Visit (INDEPENDENT_AMBULATORY_CARE_PROVIDER_SITE_OTHER): Payer: Medicare HMO | Admitting: Emergency Medicine

## 2016-08-09 DIAGNOSIS — J8410 Pulmonary fibrosis, unspecified: Secondary | ICD-10-CM | POA: Diagnosis not present

## 2016-08-09 DIAGNOSIS — J849 Interstitial pulmonary disease, unspecified: Secondary | ICD-10-CM

## 2016-08-09 DIAGNOSIS — J441 Chronic obstructive pulmonary disease with (acute) exacerbation: Secondary | ICD-10-CM

## 2016-08-09 LAB — PULMONARY FUNCTION TEST
DL/VA % PRED: 28 %
DL/VA: 1.52 ml/min/mmHg/L
DLCO UNC: 6.15 ml/min/mmHg
DLCO cor % pred: 19 %
DLCO cor: 5.92 ml/min/mmHg
DLCO unc % pred: 19 %
FEF 25-75 Post: 0.88 L/sec
FEF 25-75 Pre: 0.6 L/sec
FEF2575-%CHANGE-POST: 45 %
FEF2575-%PRED-POST: 51 %
FEF2575-%Pred-Pre: 35 %
FEV1-%CHANGE-POST: 11 %
FEV1-%Pred-Post: 70 %
FEV1-%Pred-Pre: 63 %
FEV1-PRE: 1.3 L
FEV1-Post: 1.45 L
FEV1FVC-%Change-Post: 7 %
FEV1FVC-%Pred-Pre: 82 %
FEV6-%Change-Post: 3 %
FEV6-%PRED-POST: 84 %
FEV6-%Pred-Pre: 82 %
FEV6-PRE: 2.09 L
FEV6-Post: 2.15 L
FEV6FVC-%CHANGE-POST: 0 %
FEV6FVC-%PRED-PRE: 104 %
FEV6FVC-%Pred-Post: 103 %
FVC-%CHANGE-POST: 4 %
FVC-%PRED-POST: 82 %
FVC-%Pred-Pre: 79 %
FVC-POST: 2.18 L
FVC-Pre: 2.09 L
POST FEV1/FVC RATIO: 67 %
PRE FEV6/FVC RATIO: 100 %
Post FEV6/FVC ratio: 99 %
Pre FEV1/FVC ratio: 62 %
RV % PRED: 126 %
RV: 3.3 L
TLC % pred: 98 %
TLC: 5.69 L

## 2016-08-09 LAB — CULTURE, BLOOD (ROUTINE X 2)
CULTURE: NO GROWTH
CULTURE: NO GROWTH

## 2016-08-09 NOTE — Patient Instructions (Addendum)
Please complete your tamiflu  Continue your prednisone 40mg  until complete gone. Then go back to your 5mg  daily.  Please continue your Spiriva and Symbicort  Take albuterol 2 puffs up to every 4 hours if needed for shortness of breath.  Continue your oxygen at 4L/min at all times.  Follow with Dr in 4 months or sooner if you have any problems.

## 2016-08-09 NOTE — Assessment & Plan Note (Signed)
Follow clinically and with serial imaging, PFT. Her FEV1 is decreased compared with 2012

## 2016-08-09 NOTE — Progress Notes (Signed)
Subjective:    Patient ID: [name], female    DOB: Jun 12, 1935, 82 y.o.   MRN: [id number]  HPI 82 year old former smoker with a history of rheumatoid arthritis who has been followed by Dr Gwenette Greet for COPD and mild interstitial lung disease that has been presumed to be related to her autoimmune process. At her last visit she was restarted on Spiriva in addition to her maintenance Symbicort. She never took the Spiriva. In fact she has been off Symbicort and started using her husband's Pulmicort primarily due to cost. She is taking pred 44m qd, has been doing so for many years. Does not want to wean to off.    She is using 2 liters per minute. Her last chest x-ray was performed on 05/23/14 and I personally reviewed this. It shows stable changes consistent with hyperinflation without any obvious interstitial disease.  She is active, is able to shop as long as she has walker and her O2. She rarely hears wheeze, has minimal cough.   ROV 12/03/15 -- patient with a history of interstitial lung disease associated with her rheumatoid arthritis as well as COPD, documented associated hypoxemia. She had been written for Spiriva and Symbicort but was not on that regimen when I met her last visit. She instead has been using her husband's Pulmicort intermittently. At her last visit we started Spiriva. She is unsure whether it  Helps much. She is on prednisone 5 g daily, Humira. Chest x-ray at her last visit in October 2016 showed moderately severe interstitial disease without any significant interval change. She has daily cough, prod of clear phlegm. Her O2 is on 2L/min, wears consistently.   ROV 03/29/16 -- This is a follow-up visit per patient has history of COPD as well as interstitial lung disease associated with autoimmune rheumatoid arthritis. She has hypoxemic respiratory failure, using 2L/min up to 3 when she is active. She believes that her dyspnea is worse than at our last visit. Most noticeable when she  ambulates. She is on Symbicort bid. She is not taking spiriva currently. No cough, no real wheeze. She is having a lot of congestion, nasal gtt, throat irritation. Not clear to me that she remembers all of her medications and how to use them. She was just seen in cardiology by Dr. NJohnsie Cancel notes indicate that she was to change from Norvasc to verapamil but she does not know anything about this.  ROV 06/06/16 -- this is a follow-up visit for chronic hypoxemic respiratory failure in the setting of COPD and ILD presumed to be related to RA. She was treated in the September with a prednisone taper for a presumed flare of COPD/ILD. Also given levofloxacin. Then she was tapered again 10/10. She is having continued cough, dyspnea. Dark mucous. She is on symbicort. She would like to change from pulsed o2 to continuous flow 3L/min. Her last PFT were in 2008.   ROV 08/09/16 -- [name] follows up today for chronic hypoxemic respiratory failure in the setting of interstitial lung disease due to rheumatoid arthritis + Humira, as well as presumed COPD. With pulmonary function testing today that I have personally reviewed. This shows moderately severe obstruction with a borderline bronchodilator response, lung volumes are normal, diffusion capacity is severely decreased. Current bronchodilators are Symbicort, Spiriva, albuterol as needed which she uses approximately . She is usually on prednisone 5 mg daily. She was recently increased to 40 mg and started on Tamiflu during hospitalization 1/3 - 1/5. Her husband is home  with the flu now as well.    Review of Systems As per HPI  Past Medical History:  Diagnosis Date  . COPD (chronic obstructive pulmonary disease) ([place])   . Emphysema   . Hypertension   . Interstitial lung disease ([place])   . On home O2    2L N/C  . Osteopenia   . Psoriasis   . Pthirus inguinalis   . Rheumatoid arthritis(714.0)   . Vertigo      Family History  Problem Relation Age of Onset  .  Heart disease Sister   . Colon cancer Sister   . Asthma Sister      Social History   Social History  . Marital status: Married    Spouse name: N/A  . Number of children: N/A  . Years of education: N/A   Occupational History  . Not on file.   Social History Main Topics  . Smoking status: Former Smoker    Packs/day: 0.50    Years: 50.00    Types: Cigarettes    Quit date: 08/01/2000  . Smokeless tobacco: Never Used  . Alcohol use No  . Drug use: No  . Sexual activity: Not on file   Other Topics Concern  . Not on file   Social History Narrative  . No narrative on file     No Known Allergies   Outpatient Medications Prior to Visit  Medication Sig Dispense Refill  . albuterol (PROVENTIL HFA;VENTOLIN HFA) 108 (90 Base) MCG/ACT inhaler Inhale 2 puffs into the lungs every 6 (six) hours as needed for wheezing or shortness of breath. 1 Inhaler 2  . amLODipine (NORVASC) 10 MG tablet Take 1 tablet by mouth daily.    Marland Kitchen aspirin 81 MG tablet Take 81 mg by mouth daily.     . budesonide-formoterol (SYMBICORT) 160-4.5 MCG/ACT inhaler Inhale 2 puffs into the lungs 2 (two) times daily. 1 Inhaler 6  . clobetasol cream (TEMOVATE) 8.11 % Apply 1 application topically daily as needed. For psoriasis    . desonide (DESOWEN) 0.05 % ointment Apply 1 application topically daily as needed. For psoriasis    . diclofenac sodium (VOLTAREN) 1 % GEL Apply 2 g topically See admin instructions. Once every three days as needed for rheumatoid arthritis    . folic acid (FOLVITE) 572 MCG tablet Take 800 mcg by mouth daily.     . furosemide (LASIX) 40 MG tablet Take 1 tablet by mouth as needed for fluid.     . Multiple Vitamins-Minerals (MULTIVITAMIN & MINERAL PO) Take 1 tablet by mouth daily.    . niacin 500 MG tablet Take 500 mg by mouth daily.     Marland Kitchen oseltamivir (TAMIFLU) 30 MG capsule Take 1 capsule (30 mg total) by mouth 2 (two) times daily. 8 capsule 0  . predniSONE (DELTASONE) 20 MG tablet Take 2 tablets  (40 mg total) by mouth daily with breakfast. 10 tablet 0  . predniSONE (DELTASONE) 5 MG tablet Take 1 tablet (5 mg total) by mouth daily. 30 tablet 2  . levofloxacin (LEVAQUIN) 500 MG tablet Take 1 tablet (500 mg total) by mouth daily. (Patient not taking: Reported on 08/09/2016) 6 tablet 0  . Tiotropium Bromide Monohydrate (SPIRIVA RESPIMAT) 2.5 MCG/ACT AERS 2 puffs once a day 2 Inhaler 0   No facility-administered medications prior to visit.        Objective:   Physical Exam  Vitals:   08/09/16 1603  BP: 138/68  Pulse: 87  SpO2: 95%  Gen: Pleasant, elderly woman using walker , in no distress,  normal affect  ENT: No lesions,  mouth clear,  oropharynx clear, no postnasal drip  Neck: No JVD, no stridor  Lungs: No use of accessory muscles, distant, bilateral basilar inspiratory crackles  Cardiovascular: RRR, heart sounds normal, no murmur or gallops, trace peripheral edema  Musculoskeletal: No deformities, no cyanosis or clubbing  Neuro: alert, non focal  Skin: Warm, no lesions or rashes     Assessment & Plan:  COPD with exacerbation ([place]) With acute exacerbation in the setting of influenza. She is on a higher dose prednisone and Tamiflu currently. We will continue these until completely gone. Continue her usual bronchodilator regimen. Her oxygen need is 4 L/m  PULMONARY FIBROSIS Follow clinically and with serial imaging, PFT. Her FEV1 is decreased compared with 2012  Baltazar Apo, MD, PhD 08/09/2016, 4:27 PM [facility] [phone] or if no answer [phone]

## 2016-08-09 NOTE — Assessment & Plan Note (Signed)
With acute exacerbation in the setting of influenza. She is on a higher dose prednisone and Tamiflu currently. We will continue these until completely gone. Continue her usual bronchodilator regimen. Her oxygen need is 4 L/m

## 2016-08-23 DIAGNOSIS — J440 Chronic obstructive pulmonary disease with acute lower respiratory infection: Secondary | ICD-10-CM | POA: Diagnosis not present

## 2016-08-27 ENCOUNTER — Other Ambulatory Visit: Payer: Self-pay | Admitting: Emergency Medicine

## 2016-09-09 DIAGNOSIS — R600 Localized edema: Secondary | ICD-10-CM | POA: Diagnosis not present

## 2016-09-09 DIAGNOSIS — Z7189 Other specified counseling: Secondary | ICD-10-CM | POA: Diagnosis not present

## 2016-09-09 DIAGNOSIS — R42 Dizziness and giddiness: Secondary | ICD-10-CM | POA: Diagnosis not present

## 2016-09-15 ENCOUNTER — Telehealth: Payer: Self-pay

## 2016-09-15 MED ORDER — DOXYCYCLINE HYCLATE 100 MG PO TABS: 100.0000 mg | ORAL_TABLET | Freq: Two times a day (BID) | ORAL | 0 refills | Status: DC

## 2016-09-15 NOTE — Telephone Encounter (Signed)
Pt aware of RB's recommendations and voiced her understanding. Rx sent to preferred pharmacy.  I have lm for Barbara Cower with [facility] to get more information in regards to the concentrator. Will await call back.

## 2016-09-15 NOTE — Telephone Encounter (Signed)
OK to order doxycycline 100mg  bid x 7 days for presumed bronchitis.   With regard to her o2 - unclear to me based on her statements what her most recent o2 dose order reads. How much is she supposed to be on? This will determine whether we should order a new concentrator.

## 2016-09-15 NOTE — Telephone Encounter (Signed)
Spoke with Barbara Cower with [facility], who states pt will need an RX for 4L O2, and  order for home filled system for 10L, as there is no previously Rx for 4L O2. The only order [facility] is able to see is for 2L O2 cont. Per 08/09/16 ov note, pt to cont 4L O2.  Pt will need an OV, and qualifying walk. I have spoke with pt and made her aware that an appt is needed. Pt states she will need to call back to schedule, as she nor her husband drive.  Will await call back.

## 2016-09-15 NOTE — Telephone Encounter (Signed)
RB seen pt's husband yesterday for OV, pt had some  Pt c/o prod cough with brown mucus X 1wk denies any other pulmonary symptoms Pt is requesting recommendations. Pt also states she will need a new order placed to [facility], for a 10L oxygen concentrator, as her current concentrator only goes to 5L. Pt is currently on 4L O2.  RB please advise. Thanks.

## 2016-09-16 ENCOUNTER — Telehealth: Payer: Self-pay | Admitting: Adult Health

## 2016-09-16 ENCOUNTER — Ambulatory Visit (INDEPENDENT_AMBULATORY_CARE_PROVIDER_SITE_OTHER): Payer: Medicare HMO | Admitting: Adult Health

## 2016-09-16 ENCOUNTER — Encounter: Payer: Self-pay | Admitting: Adult Health

## 2016-09-16 VITALS — BP 128/74 | HR 74 | Ht 69.0 in | Wt 170.2 lb

## 2016-09-16 DIAGNOSIS — J9611 Chronic respiratory failure with hypoxia: Secondary | ICD-10-CM

## 2016-09-16 DIAGNOSIS — J449 Chronic obstructive pulmonary disease, unspecified: Secondary | ICD-10-CM | POA: Diagnosis not present

## 2016-09-16 DIAGNOSIS — J8410 Pulmonary fibrosis, unspecified: Secondary | ICD-10-CM | POA: Diagnosis not present

## 2016-09-16 MED ORDER — BUDESONIDE-FORMOTEROL FUMARATE 160-4.5 MCG/ACT IN AERO: 2.0000 | INHALATION_SPRAY | Freq: Two times a day (BID) | RESPIRATORY_TRACT | 0 refills | Status: AC

## 2016-09-16 MED ORDER — TIOTROPIUM BROMIDE MONOHYDRATE 2.5 MCG/ACT IN AERS: INHALATION_SPRAY | RESPIRATORY_TRACT | 0 refills | Status: DC

## 2016-09-16 NOTE — Assessment & Plan Note (Signed)
Compensated on present regimen   Plan  . Patient Instructions  Continue on Prednisone 5mg  daily  Please continue your Spiriva and Symbicort  Continue your oxygen at 4L/min at rest and 6l/m walking .  Order for new portable concentrator .  Follow with Dr in 4 months or sooner if you have any problems.

## 2016-09-16 NOTE — Telephone Encounter (Signed)
Spoke with the pt  She states that [facility] told her that we never sent o2 order  I called and spoke with Melissa and she states that they did receive the order and are aware she needs this today  They are working on this currently and called her already at 2:30  I spoke with her and confirmed that she is aware that they are working on her order  Nothing further needed

## 2016-09-16 NOTE — Telephone Encounter (Signed)
Patient is waiting in lobby, states was to come in and do a walk to get oxygen.Charm Rings

## 2016-09-16 NOTE — Patient Instructions (Signed)
Continue on Prednisone 5mg  daily  Please continue your Spiriva and Symbicort  Continue your oxygen at 4L/min at rest and 6l/m walking .  Order for new portable concentrator .  Follow with Dr in 4 months or sooner if you have any problems.

## 2016-09-16 NOTE — Telephone Encounter (Signed)
Pt scheduled for OV with TP at 10:15 today for OV and walk, as her oxygen concentrator is broken. Nothing further needed.

## 2016-09-16 NOTE — Assessment & Plan Note (Signed)
Cont on O2  Order to DME for repair   Plan Patient Instructions  Continue on Prednisone 5mg  daily  Please continue your Spiriva and Symbicort  Continue your oxygen at 4L/min at rest and 6l/m walking .  Order for new portable concentrator .  Follow with Dr in 4 months or sooner if you have any problems.

## 2016-09-16 NOTE — Progress Notes (Signed)
@[name]  ID: , female    DOB: 1935-02-20, 82 y.o.   MRN: [id number]  Chief Complaint  [name] presents with  . Follow-up    COPD     Referring provider: [id number], MD  HPI: 82 yo female former smoker followed for COPD and Mild ILD -r/t RA , O2 RF   09/16/2016 Follow up : COPD/O2 RF  Pt returns for follow up . She says her breathing has been stable with no flare of cough or wheezing .  She remains on Spiriva and Symbicort along with prednisone 5 mg.  She is on O2 4l/m at rest and 6 l/m walking . Says her concentrator stopped working last night . She is using her portable helios device. She called her DME company and they told her they could not service it without her being seen in office and new order. She was seen last month >30 d.  We have called the DME company with concerns and they have assured our office they will go out and fix her device.  [name] Saturations on Room Air at Rest = 83% [name] Saturations on 6 Liters of oxygen while Ambulating = 91% She feels the oxygen helps her.    No Known Allergies  Immunization History  Administered Date(s) Administered  . Influenza Split 05/09/2014, 06/01/2016  . Influenza Whole 04/28/2008, 04/27/2009, 04/02/2011  . Influenza,inj,Quad PF,36+ Mos 05/29/2015    Past Medical History:  Diagnosis Date  . COPD (chronic obstructive pulmonary disease) ([facility])   . Emphysema   . Hypertension   . Interstitial lung disease ([facility])   . On home O2    2L N/C  . Osteopenia   . Psoriasis   . Pthirus inguinalis   . Rheumatoid arthritis(714.0)   . Vertigo     Tobacco History: History  Smoking Status  . Former Smoker  . Packs/day: 0.50  . Years: 50.00  . Types: Cigarettes  . Quit date: 08/01/2000  Smokeless Tobacco  . Never Used   Counseling given: Not Answered   Outpatient Encounter Prescriptions as of 09/16/2016  Medication Sig  . Adalimumab (HUMIRA) 40 MG/0.8ML PSKT Inject 40 mg into the skin every 14  (fourteen) days.  09/18/2016 albuterol (PROVENTIL HFA;VENTOLIN HFA) 108 (90 Base) MCG/ACT inhaler Inhale 2 puffs into the lungs every 6 (six) hours as needed for wheezing or shortness of breath.  Marland Kitchen amLODipine (NORVASC) 10 MG tablet Take 1 tablet by mouth daily.  Marland Kitchen aspirin 81 MG tablet Take 81 mg by mouth daily.   . budesonide-formoterol (SYMBICORT) 160-4.5 MCG/ACT inhaler Inhale 2 puffs into the lungs 2 (two) times daily.  . clobetasol cream (TEMOVATE) 0.05 % Apply 1 application topically daily as needed. For psoriasis  . desonide (DESOWEN) 0.05 % ointment Apply 1 application topically daily as needed. For psoriasis  . diclofenac sodium (VOLTAREN) 1 % GEL Apply 2 g topically See admin instructions. Once every three days as needed for rheumatoid arthritis  . doxycycline (VIBRA-TABS) 100 MG tablet Take 1 tablet (100 mg total) by mouth 2 (two) times daily.  . folic acid (FOLVITE) 800 MCG tablet Take 800 mcg by mouth daily.   . furosemide (LASIX) 40 MG tablet Take 1 tablet by mouth as needed for fluid.   . Multiple Vitamins-Minerals (MULTIVITAMIN & MINERAL PO) Take 1 tablet by mouth daily.  . niacin 500 MG tablet Take 500 mg by mouth daily.   . predniSONE (DELTASONE) 5 MG tablet TAKE ONE TABLET BY MOUTH ONCE DAILY  . [  DISCONTINUED] budesonide-formoterol (SYMBICORT) 160-4.5 MCG/ACT inhaler Inhale 2 puffs into the lungs 2 (two) times daily.  . Tiotropium Bromide Monohydrate (SPIRIVA RESPIMAT) 2.5 MCG/ACT AERS 2 puffs once a day  . [DISCONTINUED] levofloxacin (LEVAQUIN) 500 MG tablet Take 1 tablet (500 mg total) by mouth daily. ([name] not taking: Reported on 09/16/2016)  . [DISCONTINUED] oseltamivir (TAMIFLU) 30 MG capsule Take 1 capsule (30 mg total) by mouth 2 (two) times daily. ([name] not taking: Reported on 09/16/2016)  . [DISCONTINUED] predniSONE (DELTASONE) 20 MG tablet Take 2 tablets (40 mg total) by mouth daily with breakfast. ([name] not taking: Reported on 09/16/2016)  . [DISCONTINUED] predniSONE  (DELTASONE) 5 MG tablet Take 1 tablet (5 mg total) by mouth daily. ([name] not taking: Reported on 09/16/2016)  . [DISCONTINUED] Tiotropium Bromide Monohydrate (SPIRIVA RESPIMAT) 2.5 MCG/ACT AERS 2 puffs once a day   No facility-administered encounter medications on file as of 09/16/2016.      Review of Systems  Constitutional:   No  weight loss, night sweats,  Fevers, chills,  +fatigue, or  lassitude.  HEENT:   No headaches,  Difficulty swallowing,  Tooth/dental problems, or  Sore throat,                No sneezing, itching, ear ache, nasal congestion, post nasal drip,   CV:  No chest pain,  Orthopnea, PND, swelling in lower extremities, anasarca, dizziness, palpitations, syncope.   GI  No heartburn, indigestion, abdominal pain, nausea, vomiting, diarrhea, change in bowel habits, loss of appetite, bloody stools.   Resp:    No chest wall deformity  Skin: no rash or lesions.  GU: no dysuria, change in color of urine, no urgency or frequency.  No flank pain, no hematuria   MS:  No joint pain or swelling.  No decreased range of motion.  No back pain.    Physical Exam  BP 128/74 (BP Location: Left Arm, Cuff Size: Normal)   Pulse 74   Ht 5\' 9"  (1.753 m)   Wt 170 lb 3.2 oz (77.2 kg)   SpO2 94%   BMI 25.13 kg/m   GEN: A/Ox3; pleasant , NAD, elderly    HEENT:  /AT,  EACs-clear, TMs-wnl, NOSE-clear, THROAT-clear, no lesions, no postnasal drip or exudate noted.   NECK:  Supple w/ fair ROM; no JVD; normal carotid impulses w/o bruits; no thyromegaly or nodules palpated; no lymphadenopathy.    RESP  Decreased BS in bases . no accessory muscle use, no dullness to percussion  CARD:  RRR, no m/r/g, tr  peripheral edema, pulses intact, no cyanosis or clubbing.  GI:   Soft & nt; nml bowel sounds; no organomegaly or masses detected.   Musco: Warm bil, no deformities or joint swelling noted.   Neuro: alert, no focal deficits noted.    Skin: Warm, no lesions or rashes    Lab  Results:  CBC  Imaging: No results found.   Assessment & Plan:   No problem-specific Assessment & Plan notes found for this encounter.     , NP 09/16/2016

## 2016-09-19 ENCOUNTER — Telehealth: Payer: Self-pay

## 2016-09-19 ENCOUNTER — Other Ambulatory Visit: Payer: Self-pay | Admitting: Family Medicine

## 2016-09-19 DIAGNOSIS — I499 Cardiac arrhythmia, unspecified: Secondary | ICD-10-CM

## 2016-09-19 DIAGNOSIS — R42 Dizziness and giddiness: Secondary | ICD-10-CM

## 2016-09-19 NOTE — Telephone Encounter (Signed)
Received lab results from patient's PCP. Dr. Eden Emms reviewed and noted cholesterol is elevated and to follow up with primary for Rx. Results will be sent to medical records to be scanned. Patient aware of results and Dr. Fabio Bering note.

## 2016-09-23 ENCOUNTER — Encounter: Payer: Self-pay | Admitting: Cardiovascular Disease

## 2016-09-23 ENCOUNTER — Ambulatory Visit (INDEPENDENT_AMBULATORY_CARE_PROVIDER_SITE_OTHER): Payer: Medicare HMO | Admitting: Cardiovascular Disease

## 2016-09-23 DIAGNOSIS — R002 Palpitations: Secondary | ICD-10-CM

## 2016-09-23 MED ORDER — VERAPAMIL HCL ER 180 MG PO TBCR: 180.0000 mg | EXTENDED_RELEASE_TABLET | Freq: Every day | ORAL | 3 refills | Status: DC

## 2016-09-23 NOTE — Patient Instructions (Addendum)
Medication Instructions:  Your physician has recommended you make the following change in your medication:  1-STOP Norvasc 2-START Verapamil 180 mg by mouth day  Labwork: NONE  Testing/Procedures: Your physician has recommended that you wear an event monitor in two weeks. Event monitors are medical devices that record the heart's electrical activity. Doctors most often [place] these monitors to diagnose arrhythmias. Arrhythmias are problems with the speed or rhythm of the heartbeat. The monitor is a small, portable device. You can wear one while you do your normal daily activities. This is usually used to diagnose what is causing palpitations/syncope (passing out).  Follow-Up: Your physician recommends that you schedule a follow-up appointment in: 3 months with Norma Fredrickson NP  Your physician wants you to follow-up in: 6 months with Dr. Eden Emms. You will receive a reminder letter in the mail two months in advance. If you don't receive a letter, please call our office to schedule the follow-up appointment.   If you need a refill on your cardiac medications before your next appointment, please call your pharmacy.

## 2016-09-23 NOTE — Progress Notes (Signed)
Cardiology Office Note   Date:  09/23/2016   ID:  [name], DOB June 24, 1935, MRN [id number]  PCP:  Clayborn Heron, MD  Cardiologist:   Charlton Haws, MD   Chief Complaint  Patient presents with  . Palpitations      History of Present Illness: [name] is a 82 y.o. female who presents for evaluation of arrhythmia. Had rheumatoid lung and COPD form smoking. Been on oxygen for 8 years continuous. No previous cardiac issues.  She had right cataract done by Dr Hazle Quant 2 years ago with good result and needs right one done No chest pain, chronic dyspnea from lung disease no syncope  Has had palpitations for the last few months Mostly at night when she lays down.   Referral indicated ? afib but ECG shows SR with MAT and multiform PAC;s.      Past Medical History:  Diagnosis Date  . COPD (chronic obstructive pulmonary disease) (HCC)   . Emphysema   . Hypertension   . Interstitial lung disease (HCC)   . On home O2    2L N/C  . Osteopenia   . Psoriasis   . Pthirus inguinalis   . Rheumatoid arthritis(714.0)   . Vertigo     Past Surgical History:  Procedure Laterality Date  . CHOLECYSTECTOMY    . VESICOVAGINAL FISTULA CLOSURE W/ TAH  1962     Current Outpatient Prescriptions  Medication Sig Dispense Refill  . Adalimumab (HUMIRA) 40 MG/0.8ML PSKT Inject 40 mg into the skin every 14 (fourteen) days.    Marland Kitchen albuterol (PROVENTIL HFA;VENTOLIN HFA) 108 (90 Base) MCG/ACT inhaler Inhale 2 puffs into the lungs every 6 (six) hours as needed for wheezing or shortness of breath. 1 Inhaler 2  . amLODipine (NORVASC) 10 MG tablet Take 1 tablet by mouth daily.    Marland Kitchen aspirin 81 MG tablet Take 81 mg by mouth daily.     . budesonide-formoterol (SYMBICORT) 160-4.5 MCG/ACT inhaler Inhale 2 puffs into the lungs 2 (two) times daily. 3 Inhaler 0  . clobetasol cream (TEMOVATE) 0.05 % Apply 1 application topically daily as needed. For psoriasis    . desonide (DESOWEN) 0.05 % ointment  Apply 1 application topically daily as needed. For psoriasis    . diclofenac sodium (VOLTAREN) 1 % GEL Apply 2 g topically See admin instructions. Once every three days as needed for rheumatoid arthritis    . doxycycline (VIBRA-TABS) 100 MG tablet Take 1 tablet (100 mg total) by mouth 2 (two) times daily. 14 tablet 0  . folic acid (FOLVITE) 800 MCG tablet Take 800 mcg by mouth daily.     . furosemide (LASIX) 40 MG tablet Take 1 tablet by mouth as needed for fluid.     . Multiple Vitamins-Minerals (MULTIVITAMIN & MINERAL PO) Take 1 tablet by mouth daily.    . niacin 500 MG tablet Take 500 mg by mouth daily.     . predniSONE (DELTASONE) 5 MG tablet TAKE ONE TABLET BY MOUTH ONCE DAILY 30 tablet 2  . Tiotropium Bromide Monohydrate (SPIRIVA RESPIMAT) 2.5 MCG/ACT AERS 2 puffs once a day 1 Inhaler 0   No current facility-administered medications for this visit.     Allergies:   Patient has no known allergies.    Social History:  The patient  reports that she quit smoking about 16 years ago. Her smoking use included Cigarettes. She has a 25.00 pack-year smoking history. She has never used smokeless tobacco. She reports that she  does not drink alcohol or use drugs.   Family History:  The patient's family history includes Asthma in her sister; Colon cancer in her sister; Heart disease in her sister.    ROS:  Please see the history of present illness.   Otherwise, review of systems are positive for none.   All other systems are reviewed and negative.    PHYSICAL EXAM: VS:  There were no vitals taken for this visit. , BMI There is no height or weight on file to calculate BMI. Affect appropriate Chronically ill black female  HEENT: normal Neck supple with no adenopathy JVP normal no bruits no thyromegaly Lungs poor BS  no wheezing and good diaphragmatic motion Heart:  S1/S2 no murmur, no rub, gallop or click PMI normal Abdomen: benighn, BS positve, no tenderness, no AAA no bruit.  No HSM or  HJR Distal pulses intact with no bruits No edema Neuro non-focal Skin warm and dry No muscular weakness    EKG:   SR multiform PAC;s consistent with MAT RBBB no old ECG to compare  09/23/16  SR rate 61 PAC no change   Recent Labs: 08/04/2016: ALT 9; B Natriuretic Peptide 256.8; BUN 24; Creatinine, Ser 1.61; Hemoglobin 11.1; Platelets 154; Potassium 3.9; Sodium 136    Lipid Panel    Component Value Date/Time   CHOL  08/07/2008 0510    183        ATP III CLASSIFICATION:  <200     mg/dL   Desirable  009-381  mg/dL   Borderline High  >=829    mg/dL   High          TRIG 937 08/07/2008 0510   HDL 49 08/07/2008 0510   CHOLHDL 3.7 08/07/2008 0510   VLDL 22 08/07/2008 0510   LDLCALC (H) 08/07/2008 0510    112        Total Cholesterol/HDL:CHD Risk Coronary Heart Disease Risk Table                     Men   Women  1/2 Average Risk   3.4   3.3  Average Risk       5.0   4.4  2 X Average Risk   9.6   7.1  3 X Average Risk  23.4   11.0        Use the calculated Patient Ratio above and the CHD Risk Table to determine the patient's CHD Risk.        ATP III CLASSIFICATION (LDL):  <100     mg/dL   Optimal  169-678  mg/dL   Near or Above                    Optimal  130-159  mg/dL   Borderline  938-101  mg/dL   High  >751     mg/dL   Very High      Wt Readings from Last 3 Encounters:  09/16/16 170 lb 3.2 oz (77.2 kg)  08/04/16 183 lb 3.2 oz (83.1 kg)  06/06/16 183 lb (83 kg)      Other studies Reviewed: Additional studies/ records that were reviewed today include: Notes from Dr Delton Coombes ECG and CXR/CT;s .    ASSESSMENT AND PLAN:  1. MAT:  Not afib does not need anticoagulation related to severe underlying lung dx.  Change norvasc to verapamil.  48 hr holter and echo She did not follow through with these recommendations Last time   2.  Cataract: preop clear to have surgery with Dr Hazle Quant 3. COPD:  F/u with Dr Delton Coombes continue oxygen 2L's , spiriva, prednisone and humira echo   To assess PA pressures    Current medicines are reviewed at length with the patient today.  The patient does not have concerns regarding medicines.  The following changes have been made:  D/c Norvasc start verapamil   Labs/ tests ordered today include: 48 hr holter, echo   Orders Placed This Encounter  Procedures  . EKG 12-Lead     Disposition:   FU with EP post testing      Signed, Charlton Haws, MD  09/23/2016 2:52 PM    [facility] [address] [place], [place], [place]  [address] Phone: [phone]; Fax: [phone]

## 2016-09-26 DIAGNOSIS — N390 Urinary tract infection, site not specified: Secondary | ICD-10-CM | POA: Diagnosis not present

## 2016-09-26 DIAGNOSIS — Z6832 Body mass index (BMI) 32.0-32.9, adult: Secondary | ICD-10-CM | POA: Diagnosis not present

## 2016-09-26 DIAGNOSIS — N183 Chronic kidney disease, stage 3 (moderate): Secondary | ICD-10-CM | POA: Diagnosis not present

## 2016-09-26 DIAGNOSIS — I10 Essential (primary) hypertension: Secondary | ICD-10-CM | POA: Diagnosis not present

## 2016-10-04 ENCOUNTER — Other Ambulatory Visit (HOSPITAL_COMMUNITY): Payer: Medicare HMO

## 2016-10-11 ENCOUNTER — Other Ambulatory Visit (HOSPITAL_COMMUNITY): Payer: Medicare HMO

## 2016-11-03 ENCOUNTER — Telehealth: Payer: Self-pay | Admitting: Pharmacist

## 2016-11-03 ENCOUNTER — Encounter: Payer: Self-pay | Admitting: *Deleted

## 2016-11-03 NOTE — Patient Outreach (Signed)
[facility]) Care Management  11/03/2016  [name] [birth year] [id number]   Patient was called regarding medication assistance per referral from [facility] Social Worker, [employer].  No answer. HIPAA compliant message left on patient's voicemail.  Plan:  Call patient back within three business days.  Beecher Mcardle, PharmD, BCACP [facility] Clinical Pharmacist [phone]

## 2016-11-04 DIAGNOSIS — M069 Rheumatoid arthritis, unspecified: Secondary | ICD-10-CM | POA: Diagnosis not present

## 2016-11-07 ENCOUNTER — Telehealth: Payer: Self-pay | Admitting: Emergency Medicine

## 2016-11-07 ENCOUNTER — Encounter: Payer: Self-pay | Admitting: Pharmacist

## 2016-11-07 ENCOUNTER — Other Ambulatory Visit: Payer: Self-pay | Admitting: Pharmacist

## 2016-11-07 MED ORDER — DOXYCYCLINE HYCLATE 100 MG PO TABS: 100.0000 mg | ORAL_TABLET | Freq: Two times a day (BID) | ORAL | 0 refills | Status: DC

## 2016-11-07 NOTE — Telephone Encounter (Signed)
Rx sent to preferred pharmacy. Pt aware and voiced her understanding. Nothing further needed.  

## 2016-11-07 NOTE — Telephone Encounter (Signed)
CY  Please Advise in RB's absence-Sick message    Pt called in c/o she noticed when she is coughing, she's coughing up  greenish-brown phlegm which has a bad odor,she is unsure of fever but has felt warm Denies wheezing, any new breathing concerns,chest tightness. Please advise of what pt's next steps should be   No Known Allergies

## 2016-11-07 NOTE — Patient Outreach (Signed)
[facility]) Care Management  [facility]   11/07/2016  [name] 03-24-1935 [id number]  Subjective: Patient was called regarding medication assistance per referral from [facility] Social Worker, [employer].  HIPAA identifiers were obtained.  Patient is a 82 year old female with multiple medical conditions including but not limited to: rheumatoid arthritis, COPD on 3L home O2, HTN, CKD III, and osteopenia.  Patient was very hard of hearing over the phone.  She reported managing her own medications.  She stated she was already receiving Humira from a patient assistance program.  She did say she gets samples of her inhalers some time but has difficulty affording the copays for her inhalers.  She also expressed difficulty affording clobetasol or denoide topical treatments for psoriasis due to cost. (Both creams are tier 4 on her insurance formulary).   Objective:   Encounter Medications: Outpatient Encounter Prescriptions as of 11/07/2016  Medication Sig Note  . Adalimumab (HUMIRA) 40 MG/0.8ML PSKT Inject 40 mg into the skin every 14 (fourteen) days.   Marland Kitchen albuterol (PROVENTIL HFA;VENTOLIN HFA) 108 (90 Base) MCG/ACT inhaler Inhale 2 puffs into the lungs every 6 (six) hours as needed for wheezing or shortness of breath. 11/07/2016: Reports using twice a day  . aspirin 81 MG tablet Take 81 mg by mouth daily.    . budesonide-formoterol (SYMBICORT) 160-4.5 MCG/ACT inhaler Inhale 2 puffs into the lungs 2 (two) times daily.   . ferrous sulfate 325 (65 FE) MG tablet Take 1 tablet by mouth 2 (two) times daily.   . folic acid (FOLVITE) 800 MCG tablet Take 800 mcg by mouth daily.    . furosemide (LASIX) 40 MG tablet Take 1 tablet by mouth as needed for fluid.    . niacin 500 MG tablet Take 500 mg by mouth daily.    . Omega-3 Fatty Acids (FISH OIL) 1360 MG CAPS Take 1 capsule by mouth.   . predniSONE (DELTASONE) 5 MG tablet TAKE ONE TABLET BY MOUTH ONCE DAILY   . Tiotropium Bromide  Monohydrate (SPIRIVA RESPIMAT) 2.5 MCG/ACT AERS 2 puffs once a day 11/07/2016: Reported using every other week--alternating with Symbicort  . verapamil (CALAN-SR) 180 MG CR tablet Take 1 tablet (180 mg total) by mouth at bedtime.   . clobetasol cream (TEMOVATE) 0.05 % Apply 1 application topically daily as needed. For psoriasis 11/07/2016: Too expensive on insurance  . desonide (DESOWEN) 0.05 % ointment Apply 1 application topically daily as needed. For psoriasis 11/07/2016: Too expensive on insurance  . diclofenac sodium (VOLTAREN) 1 % GEL Apply 2 g topically See admin instructions. Once every three days as needed for rheumatoid arthritis   . Multiple Vitamins-Minerals (MULTIVITAMIN & MINERAL PO) Take 1 tablet by mouth daily.   . [DISCONTINUED] amLODipine (NORVASC) 10 MG tablet Take 1 tablet by mouth daily. 11/07/2016: Was filled 09/18/16, discontinued by provided 09/23/16, however, patient reported taking it via telephone med rec on 11/07/16  . [DISCONTINUED] doxycycline (VIBRA-TABS) 100 MG tablet Take 1 tablet (100 mg total) by mouth 2 (two) times daily.    No facility-administered encounter medications on file as of 11/07/2016.     Functional Status: In your present state of health, do you have any difficulty performing the following activities: 08/04/2016  Hearing? N  Vision? N  Difficulty concentrating or making decisions? N  Walking or climbing stairs? Y  Dressing or bathing? Y  Doing errands, shopping? Y  Some recent data might be hidden    Fall/Depression Screening: No flowsheet  data found.  Assessment: Patient's medications were reconciled via telephone with the patient.  She is very hard of hearing over the phone.     Drugs sorted by system:  Cardiovascular: Amlodipine (filled 09/18/16, discontinued by provided on 09/23/16, patient reported still taking Amlodipine via telephone today) Verapamil Omega 3 fatty  Acids Niacin Furosemide Aspirin  Pulmonary/Allergy: Spiriva Symbicort Albuterol HFA  Topical: Clobetasol (on med list but patient does not use due to cost--is tier 4 on [employer] formulary) Desonide(on me list but patient does not use due to cost-tier 4 on [employer] formulary) "Psoriasis Control" OTC- patient reported using this product OTC since she could not afford clobetasol or desonide  Pain/Rheumatoid Arthritis: Humira (patient reported she receives this from Humira Patient Assistance Program) Diclofenac gel (patient reported she does not have diclofenac in her possession) Prednisone  Vitamins/Minerals: Ferrous Sulfate Folic Acid Multivitamin (denied taking   Medication Review Findings:  1. Potential therapeutic duplication: Verapamil/amlodipine-both are calcium channel blockers. Amlodipine was discontinued 09/23/16 and verapamil was started.  Patient reported having both medications in her possession and said she was taking both.  A pharmacy home visit is scheduled for Wednesday  November 09, 2016 for further investigation.  2. Adherence: Spiriva/Symbicort-patient reported alternating between Symbicort and Spiriva due to cost.  Patient Assistance Program applications will be completed during the pharmacy home visit.  Medication Assistance:  Patient has [facility] and does not have "Extra Help".    While at the home visit, she will be preliminarily screened for the "extra help program".  If she appears to qualify, an application will be completed.  Spiriva is available from [employer] is available from [employer] but requires patients to spend 3% of their income on medications. Patient said she did not have an EOB from [facility] and that she has never received one.  A phone call will be placed during the Pharmacy home visit to [facility] to determine the patient's TROOP (true out of pocket spend).   Plan: 1.  Route note to PCP  2.  Conduct pharmacy  home visit on 11/09/16  Beecher Mcardle, PharmD, [facility] Clinical Pharmacist [phone]  .

## 2016-11-07 NOTE — Telephone Encounter (Signed)
Offer doxycycline 100 mg, # 14, 1 twice daily 

## 2016-11-08 ENCOUNTER — Other Ambulatory Visit: Payer: Self-pay | Admitting: *Deleted

## 2016-11-08 ENCOUNTER — Encounter: Payer: Self-pay | Admitting: *Deleted

## 2016-11-08 NOTE — Patient Outreach (Signed)
[facility]) Care Management  11/08/2016  [name] [birth year] [id number]   CSW was able to make initial contact with patient today to perform phone assessment, as well as assess and assist with social work needs and services.  CSW introduced self, explained role and types of services provided through [facility] ([facility]).  CSW further explained to patient that CSW works with patient's [employer] Nurse Case Manager, who encouraged CSW to outreach to patient to assess and assist with any social work services and resources CSW thought patient may benefit from within the community.  CSW obtained two HIPAA compliant identifiers from patient, which included patient's name and date of birth. Patient admitted that her main concern at present is being unable to afford her prescription medications.  CSW spoke with patient about trying to set aside money each month to help pay for the added expense of medications.  Patient indicated that she is on a very fixed income and that she does not have any extra money left over each month.  CSW is aware that patient will be meeting with [name], Pharmacist with [facility] on Wednesday, April 11th, for an initial home visit, at which time Mrs. [name] will discuss medication assistance with patient.  Patient is very much looking forward to this visit, as she is optimistic in talking with Mrs. [name], that Mrs. [name] will be able to assist her with her medication expenses. CSW brought to patient's attention that she is able to receive 12 rides (12 one-way or 6 roundtrip) through her [facility] Medicare benefit with an agency called [employer].  CSW agreed to make the referral on patient's behalf, so all patient will have to do is contact the number provided to her by CSW when she needs to schedule a ride to one of her physician appointments.  Patient was most appreciative of this information.   CSW inquired about other services and/or resources that may be beneficial to patient, but patient denied needing access to anything else.  CSW provided patient with CSW's contact information, encouraging patient to contact CSW directly if social work needs arise in the near future.  Patient voiced understanding and was agreeable to this plan. CSW will perform a case closure on patient, as all goals of treatment have been met from social work standpoint and no additional social work needs have been identified at this time.  CSW will notify patient's Pharmacist with [facility], [name] of CSW's plans to close patient's case.  CSW will fax an update to patient's Primary Care Physician, Dr. Jordan Hawks Rankins to ensure that they are aware of CSW's involvement with patient's plan of care.  CSW will submit a case closure request to Verlon Setting, Care Management Assistant with [facility], in the form of an [employer].  CSW will ensure that [name] is aware of [name], Pharmacist with [facility], continued involvement with patient's care. Nat Christen, BSW, MSW, LCSW  Licensed Education officer, [facility]  Mailing [place] [address], [place], [place] [address] Physical Address-[address], [place], [place] [address] Toll Free Main # [phone] Fax # [phone] Cell # [phone]  Office # [phone] [email or web address]

## 2016-11-09 ENCOUNTER — Encounter: Payer: Self-pay | Admitting: Pharmacist

## 2016-11-09 ENCOUNTER — Other Ambulatory Visit: Payer: Self-pay | Admitting: Pharmacist

## 2016-11-09 NOTE — Patient Outreach (Addendum)
[facility]) Care Management  [facility] Pharmacy   11/09/2016  [name] [birth year] [id number]  Subjective: Pharmacy home visit completed today at the patient's home for medication assistance.  HIPAA identifiers were obtained. Patient is a 82 year old female with multiple medical conditions including but not limited to: rheumatoid arthritis, COPD on 3L home O2, HTN, CKD III, and osteopenia.  Patient's husband was present for the visit as well as Chemical engineer, ( Catie).  Patient reported she has difficulty paying for her inhalers.  She had a Dulera inhaler from her hospitalization that was empty and about 10 inhalations left on Spiriva.   Patient reported feeling "ok" today. She said her cough was better but she says she is still short of breath often.   Objective:   Encounter Medications: Outpatient Encounter Prescriptions as of 11/09/2016  Medication Sig Note  . Adalimumab (HUMIRA) 40 MG/0.8ML PSKT Inject 40 mg into the skin every 14 (fourteen) days.   Marland Kitchen aspirin 81 MG tablet Take 81 mg by mouth daily.    . budesonide-formoterol (SYMBICORT) 160-4.5 MCG/ACT inhaler Inhale 2 puffs into the lungs 2 (two) times daily.   Marland Kitchen doxycycline (VIBRA-TABS) 100 MG tablet Take 1 tablet (100 mg total) by mouth 2 (two) times daily.   . ferrous sulfate 325 (65 FE) MG tablet Take 1 tablet by mouth 2 (two) times daily.   . folic acid (FOLVITE) 800 MCG tablet Take 800 mcg by mouth daily.    . furosemide (LASIX) 40 MG tablet Take 1 tablet by mouth as needed for fluid (1/2 tablet as needed, typically twice a week).    . niacin 500 MG tablet Take 500 mg by mouth daily.    . Omega-3 Fatty Acids (FISH OIL) 1360 MG CAPS Take 1 capsule by mouth.   . predniSONE (DELTASONE) 5 MG tablet TAKE ONE TABLET BY MOUTH ONCE DAILY   . Salicylic Acid 3 % CREA Apply topically. (Psoriasis Control Cream by Lenon Oms)- Active Ingredient Salicylic Acid 3%)   . traZODone (DESYREL) 50 MG tablet Take 50 mg  by mouth at bedtime. 1/2 to 1 tablet at bedtime for sleep   . verapamil (CALAN-SR) 180 MG CR tablet Take 1 tablet (180 mg total) by mouth at bedtime.   Marland Kitchen albuterol (PROVENTIL HFA;VENTOLIN HFA) 108 (90 Base) MCG/ACT inhaler Inhale 2 puffs into the lungs every 6 (six) hours as needed for wheezing or shortness of breath. (Patient not taking: Reported on 11/09/2016) 11/07/2016: Reports using twice a day  . Tiotropium Bromide Monohydrate (SPIRIVA RESPIMAT) 2.5 MCG/ACT AERS 2 puffs once a day (Patient not taking: Reported on 11/09/2016) 11/09/2016: Patient says she could not use  . [DISCONTINUED] clobetasol cream (TEMOVATE) 0.05 % Apply 1 application topically daily as needed. For psoriasis 11/07/2016: Too expensive on insurance  . [DISCONTINUED] desonide (DESOWEN) 0.05 % ointment Apply 1 application topically daily as needed. For psoriasis 11/07/2016: Too expensive on insurance  . [DISCONTINUED] diclofenac sodium (VOLTAREN) 1 % GEL Apply 2 g topically See admin instructions. Once every three days as needed for rheumatoid arthritis   . [DISCONTINUED] Multiple Vitamins-Minerals (MULTIVITAMIN & MINERAL PO) Take 1 tablet by mouth daily.    No facility-administered encounter medications on file as of 11/09/2016.     Functional Status: In your present state of health, do you have any difficulty performing the following activities: 11/08/2016 08/04/2016  Hearing? N N  Vision? Y N  Difficulty concentrating or making decisions? Y N  Walking or climbing stairs?  Y Y  Dressing or bathing? N Y  Doing errands, shopping? Malvin Johns  Preparing Food and eating ? N -  Using the Toilet? N -  In the past six months, have you accidently leaked urine? N -  Do you have problems with loss of bowel control? N -  Managing your Medications? Y -  Managing your Finances? Y -  Housekeeping or managing your Housekeeping? Y -  Some recent data might be hidden    Fall/Depression Screening: PHQ 2/9 Scores 11/08/2016  PHQ - 2 Score 1     Assessment:  Patient's medications were reviewed with her actual medication bottles.  She has not been getting any inhalers filled due to cost.    Drugs sorted by system:  Cardiovascular: Verapamil Omega 3 fatty Acids Niacin Furosemide Aspirin  Pulmonary/Allergy: Spiriva-did not have and is not taking Symbicort-10 inhalations left in the inhaler--had an old [place] as well Albuterol HFA-did not have and is not taking  Topical: "Psoriasis Control" OTC- patient reported using this product OTC since she could not afford clobetasol or desonide  Pain/Rheumatoid Arthritis: Humira (patient reported she receives this from Humira Patient Assistance Program) Prednisone Crista Elliot  Vitamins/Minerals: Ferrous Sulfate Folic Acid Multivitamin (denied taking  Medication Review Findings:  Adherence- 1.  Symbicort or Dulera- patient reported she cannot afford.   2.  Proventil HFA- patient did not have this in her possession.  She said she does not have a rescue inhaler due to cost.  Patient Assistance Application from [employer] for Proventil HFA and Dulera were completed during the home visit and will be delivered to Dr. Kavin Leech office for signing and mailing.  2.  Spiriva-patient did not have this in her possession and the patient reported that she did not know how to use Spiriva.   Omitted Medication: Trazodone 50mg - 1/2 to 1 tablet at bedtime prn sleep ( was not on the medication list. Patient reported taking 1/2-1 tablet every two weeks as she needs it)---added to patient's medication list  Medications removed the patient said she is no longer taking and has not used in > 72months Clobetasol cream, Desonide cream, Diclofenac gel  Misc: Patient has amlodipine in her possession which was discontinued 09/23/16.  She said she was not taking it. It was removed from the patient's home.  Medication Assistance:  1.  An Extra Help Application was completed online via the 09/25/16.  2.  The patient has Conservation officer, nature [employer].  [employer] was called: -patient has a $195 deductible of which she still has $177 left to meet -patient's total out of pocket spend on medications year to date is $44.85  -patient has access to $30 per quarter for OTC medications that can be ordered from [facility] pharmacy  The [facility] OTC catalog was printed for the patent and she was educated on how to order OTC products up to $30 per quarter at no cost.  -patient expressed concern over transportation to provider visits. Through the phone call to [facility], it was discovered the patient has twelve one-way rides as part of her Medical Benefit as long as the ride is less than 25 miles and she requests the ride with a three day notice.    Patient was provided with a phone number for [facility] transportation as well as instructions on how to order a ride.  3. Patient Assistance Programs:  Patient does not qualify for Symbicort from [employer] because their program requires patients spend 3% of  their annual income on medications to be eligible.    Application for [employer]'s Patient Assistance Program for [facility] and Proventil HFA was completed--this application requires the original be MAILED back to [employer].  The application will be hand delivered to the provider's office.  Application for Spiriva through [employer]'s program was also completed and taken to Dr. Kavin Leech office for completion and to be faxed on the patient's behalf.   Plan:  1. Route note to patient's PCP.  2. Route note to Dr. Real Cons forms for [facility], Proventil HFA and Spiriva (if patient still needs this) be mailed/faxed for the patient  3.  Follow up with the patient in 1 week.   Beecher Mcardle, PharmD, BCACP [facility] Clinical Pharmacist [phone]

## 2016-11-10 ENCOUNTER — Other Ambulatory Visit: Payer: Self-pay | Admitting: Pharmacist

## 2016-11-10 ENCOUNTER — Telehealth: Payer: Self-pay | Admitting: Emergency Medicine

## 2016-11-10 NOTE — Telephone Encounter (Signed)
Pt assistance papers have been dropped off by Tamzin from [facility].   Will forward to [facility] to follow up on these papers.  thanks

## 2016-11-10 NOTE — Patient Outreach (Signed)
[facility]) Care Management  11/10/2016  [name] 09/29/1934 [id number]   Patient called and left a message for me to call her back.  HIPAA identifiers were obtained when I called her back. Patient thought I had called her this morning but I had actually called her yesterday prior to our home visit.    While the patient was on the phone, I updated her on the process of the patient assistance forms.  All forms were delivered to the provider's office and are awaiting approval by the provider before they can be mailed and faxed.  The patient was also informed [facility] purchased a Symbicort inhaler for the patient to use until the patient assistance process is completed.  The inhaler will be delivered to the patient 11/11/16.  Beecher Mcardle, PharmD, BCACP [facility] Clinical Pharmacist [phone]

## 2016-11-11 NOTE — Telephone Encounter (Signed)
RB the paperwork is for the patient to receive Dulera, Proventil, and Spiriva. She was switched to [facility] in the hospital because it was on her formulary so I am wondering if you still want her on Spiriva as well? I will await your answer then place the forms in your to sign folder. Thanks RB

## 2016-11-14 DIAGNOSIS — M069 Rheumatoid arthritis, unspecified: Secondary | ICD-10-CM | POA: Diagnosis not present

## 2016-11-14 DIAGNOSIS — Z79899 Other long term (current) drug therapy: Secondary | ICD-10-CM | POA: Diagnosis not present

## 2016-11-15 NOTE — Telephone Encounter (Signed)
I have paperwork and will fax to the company.

## 2016-11-15 NOTE — Telephone Encounter (Signed)
Yes she needs both Dulera and Spiriva

## 2016-11-15 NOTE — Telephone Encounter (Signed)
Will forward back to Delaney Meigs to make her aware of RB recs.

## 2016-11-16 NOTE — Telephone Encounter (Signed)
Please advise on status of paperwork. Thanks

## 2016-11-17 ENCOUNTER — Other Ambulatory Visit: Payer: Self-pay | Admitting: Pharmacist

## 2016-11-17 MED ORDER — TIOTROPIUM BROMIDE MONOHYDRATE 2.5 MCG/ACT IN AERS: INHALATION_SPRAY | RESPIRATORY_TRACT | 6 refills | Status: DC

## 2016-11-17 NOTE — Patient Outreach (Addendum)
[facility]) Care Management  11/17/2016  BEAUTY PLESS Mar 12, 1935 [id number]   [employer] was called on the patients' behalf to check the status of patient's application.  Representative at [employer] stated they had not received an application for Spiriva.  The [employer] prescription assistance application requires the application to be mailed.    Providers office was sent a message through an office CMA about the status of the applications.   Plan:  Follow up in 3-5 business days.  Beecher Mcardle, PharmD, BCACP [facility] Clinical Pharmacist [phone]

## 2016-11-22 DIAGNOSIS — M069 Rheumatoid arthritis, unspecified: Secondary | ICD-10-CM | POA: Diagnosis not present

## 2016-11-22 DIAGNOSIS — Z79899 Other long term (current) drug therapy: Secondary | ICD-10-CM | POA: Diagnosis not present

## 2016-11-22 DIAGNOSIS — J449 Chronic obstructive pulmonary disease, unspecified: Secondary | ICD-10-CM | POA: Diagnosis not present

## 2016-11-22 DIAGNOSIS — R531 Weakness: Secondary | ICD-10-CM | POA: Diagnosis not present

## 2016-11-22 DIAGNOSIS — M349 Systemic sclerosis, unspecified: Secondary | ICD-10-CM | POA: Diagnosis not present

## 2016-11-22 NOTE — Telephone Encounter (Signed)
I faxed one form to [employer] and mailed the other forms to [employer]. I will contact Laurelyn Sickle to let he know this has been taken care of. Nothing further is needed.

## 2016-11-30 DIAGNOSIS — Z7982 Long term (current) use of aspirin: Secondary | ICD-10-CM | POA: Diagnosis not present

## 2016-11-30 DIAGNOSIS — J849 Interstitial pulmonary disease, unspecified: Secondary | ICD-10-CM | POA: Diagnosis not present

## 2016-11-30 DIAGNOSIS — M069 Rheumatoid arthritis, unspecified: Secondary | ICD-10-CM | POA: Diagnosis not present

## 2016-11-30 DIAGNOSIS — Z9981 Dependence on supplemental oxygen: Secondary | ICD-10-CM | POA: Diagnosis not present

## 2016-11-30 DIAGNOSIS — M6281 Muscle weakness (generalized): Secondary | ICD-10-CM | POA: Diagnosis not present

## 2016-11-30 DIAGNOSIS — Z8701 Personal history of pneumonia (recurrent): Secondary | ICD-10-CM | POA: Diagnosis not present

## 2016-11-30 DIAGNOSIS — J449 Chronic obstructive pulmonary disease, unspecified: Secondary | ICD-10-CM | POA: Diagnosis not present

## 2016-12-02 DIAGNOSIS — M6281 Muscle weakness (generalized): Secondary | ICD-10-CM | POA: Diagnosis not present

## 2016-12-02 DIAGNOSIS — Z8701 Personal history of pneumonia (recurrent): Secondary | ICD-10-CM | POA: Diagnosis not present

## 2016-12-02 DIAGNOSIS — J449 Chronic obstructive pulmonary disease, unspecified: Secondary | ICD-10-CM | POA: Diagnosis not present

## 2016-12-02 DIAGNOSIS — J849 Interstitial pulmonary disease, unspecified: Secondary | ICD-10-CM | POA: Diagnosis not present

## 2016-12-02 DIAGNOSIS — M069 Rheumatoid arthritis, unspecified: Secondary | ICD-10-CM | POA: Diagnosis not present

## 2016-12-02 DIAGNOSIS — Z7982 Long term (current) use of aspirin: Secondary | ICD-10-CM | POA: Diagnosis not present

## 2016-12-02 DIAGNOSIS — Z9981 Dependence on supplemental oxygen: Secondary | ICD-10-CM | POA: Diagnosis not present

## 2016-12-05 DIAGNOSIS — M069 Rheumatoid arthritis, unspecified: Secondary | ICD-10-CM | POA: Diagnosis not present

## 2016-12-05 DIAGNOSIS — J849 Interstitial pulmonary disease, unspecified: Secondary | ICD-10-CM | POA: Diagnosis not present

## 2016-12-05 DIAGNOSIS — Z8701 Personal history of pneumonia (recurrent): Secondary | ICD-10-CM | POA: Diagnosis not present

## 2016-12-05 DIAGNOSIS — Z9981 Dependence on supplemental oxygen: Secondary | ICD-10-CM | POA: Diagnosis not present

## 2016-12-05 DIAGNOSIS — Z7982 Long term (current) use of aspirin: Secondary | ICD-10-CM | POA: Diagnosis not present

## 2016-12-05 DIAGNOSIS — J449 Chronic obstructive pulmonary disease, unspecified: Secondary | ICD-10-CM | POA: Diagnosis not present

## 2016-12-05 DIAGNOSIS — M6281 Muscle weakness (generalized): Secondary | ICD-10-CM | POA: Diagnosis not present

## 2016-12-07 ENCOUNTER — Ambulatory Visit (INDEPENDENT_AMBULATORY_CARE_PROVIDER_SITE_OTHER): Payer: Medicare HMO | Admitting: Emergency Medicine

## 2016-12-07 ENCOUNTER — Encounter: Payer: Self-pay | Admitting: Emergency Medicine

## 2016-12-07 VITALS — BP 154/88 | HR 109 | Ht 69.0 in | Wt 173.0 lb

## 2016-12-07 DIAGNOSIS — J849 Interstitial pulmonary disease, unspecified: Secondary | ICD-10-CM

## 2016-12-07 DIAGNOSIS — J449 Chronic obstructive pulmonary disease, unspecified: Secondary | ICD-10-CM

## 2016-12-07 DIAGNOSIS — J8410 Pulmonary fibrosis, unspecified: Secondary | ICD-10-CM | POA: Diagnosis not present

## 2016-12-07 NOTE — Telephone Encounter (Signed)
Called to check status, CSR stated on May 3 they sent an exception form for patients with Medicare Part D. She has to sign this form and send it back and they will approve her. I will let pt know if she shows up for her appointment today and if she doesn't I will call her to let her know to look out for the form.

## 2016-12-07 NOTE — Progress Notes (Signed)
Subjective:    Patient ID: [name], female    DOB: Jun 12, 1935, 82 y.o.   MRN: [id number]  HPI 82 year old former smoker with a history of rheumatoid arthritis who has been followed by Dr Gwenette Greet for COPD and mild interstitial lung disease that has been presumed to be related to her autoimmune process. At her last visit she was restarted on Spiriva in addition to her maintenance Symbicort. She never took the Spiriva. In fact she has been off Symbicort and started using her husband's Pulmicort primarily due to cost. She is taking pred 44m qd, has been doing so for many years. Does not want to wean to off.    She is using 2 liters per minute. Her last chest x-ray was performed on 05/23/14 and I personally reviewed this. It shows stable changes consistent with hyperinflation without any obvious interstitial disease.  She is active, is able to shop as long as she has walker and her O2. She rarely hears wheeze, has minimal cough.   ROV 12/03/15 -- patient with a history of interstitial lung disease associated with her rheumatoid arthritis as well as COPD, documented associated hypoxemia. She had been written for Spiriva and Symbicort but was not on that regimen when I met her last visit. She instead has been using her husband's Pulmicort intermittently. At her last visit we started Spiriva. She is unsure whether it  Helps much. She is on prednisone 5 g daily, Humira. Chest x-ray at her last visit in October 2016 showed moderately severe interstitial disease without any significant interval change. She has daily cough, prod of clear phlegm. Her O2 is on 2L/min, wears consistently.   ROV 03/29/16 -- This is a follow-up visit per patient has history of COPD as well as interstitial lung disease associated with autoimmune rheumatoid arthritis. She has hypoxemic respiratory failure, using 2L/min up to 3 when she is active. She believes that her dyspnea is worse than at our last visit. Most noticeable when she  ambulates. She is on Symbicort bid. She is not taking spiriva currently. No cough, no real wheeze. She is having a lot of congestion, nasal gtt, throat irritation. Not clear to me that she remembers all of her medications and how to use them. She was just seen in cardiology by Dr. NJohnsie Cancel notes indicate that she was to change from Norvasc to verapamil but she does not know anything about this.  ROV 06/06/16 -- this is a follow-up visit for chronic hypoxemic respiratory failure in the setting of COPD and ILD presumed to be related to RA. She was treated in the September with a prednisone taper for a presumed flare of COPD/ILD. Also given levofloxacin. Then she was tapered again 10/10. She is having continued cough, dyspnea. Dark mucous. She is on symbicort. She would like to change from pulsed o2 to continuous flow 3L/min. Her last PFT were in 2008.   ROV 08/09/16 -- [name] follows up today for chronic hypoxemic respiratory failure in the setting of interstitial lung disease due to rheumatoid arthritis + Humira, as well as presumed COPD. With pulmonary function testing today that I have personally reviewed. This shows moderately severe obstruction with a borderline bronchodilator response, lung volumes are normal, diffusion capacity is severely decreased. Current bronchodilators are Symbicort, Spiriva, albuterol as needed which she uses approximately . She is usually on prednisone 5 mg daily. She was recently increased to 40 mg and started on Tamiflu during hospitalization 1/3 - 1/5. Her husband is home  with the flu now as well.    ROV 12/07/16 -- this is a follow-up visit for interstitial lung disease in the setting of rheumatoid arthritis, formerly on Humira, prednisone 27m currently. She also has COPD and chronic hypoxemic respiratory failure. Her current oxygen usage is 3L/min at rest, 6L/min w exertion - she is concerned that the higher flow is making her cough some. She has been managed on Spiriva and  Symbicort, [facility]. She was treated with doxycycline 1 month ago for suspected bronchitis / sinusitis.  She having minimal cough. No wheeze. Uses albuterol a few times a week.    Review of Systems As per HPI  Past Medical History:  Diagnosis Date  . COPD [place] pulmonary disease) [place]   . Emphysema   . Hypertension   . [place]stitial lung disease [place]   . On home O2    2L N/C  . Osteopenia   . Psoriasis   . Pthirus inguinalis   . Rheumatoid arthritis(714.0)   . Vertigo      Family History  Problem Relation Age of Onset  . Heart disease Sister   . Colon cancer Sister   . Asthma Sister      Social History   Social History  . Marital status: Married    Spouse name: N/A  . Number of children: N/A  . Years of education: N/A   Occupational History  . Not on file.   Social History Main Topics  . Smoking status: Former Smoker    Packs/day: 0.50    Years: 50.00    Types: Cigarettes    Quit date: 08/01/2000  . Smokeless tobacco: Never Used  . Alcohol use No  . Drug use: No  . Sexual activity: Not on file   Other Topics Concern  . Not on file   Social History Narrative  . No narrative on file     No Known Allergies   Outpatient Medications Prior to Visit  Medication Sig Dispense Refill  . Adalimumab (HUMIRA) 40 MG/0.8ML PSKT Inject 40 mg into the skin every 14 (fourteen) days.    .Marland Kitchenalbuterol (PROVENTIL HFA;VENTOLIN HFA) 108 (90 Base) MCG/ACT inhaler Inhale 2 puffs into the lungs every 6 (six) hours as needed for wheezing or shortness of breath. 1 Inhaler 2  . aspirin 81 MG tablet Take 81 mg by mouth daily.     . budesonide-formoterol (SYMBICORT) 160-4.5 MCG/ACT inhaler Inhale 2 puffs into the lungs 2 (two) times daily. 3 Inhaler 0  . doxycycline (VIBRA-TABS) 100 MG tablet Take 1 tablet (100 mg total) by mouth 2 (two) times daily. 14 tablet 0  . ferrous sulfate 325 (65 FE) MG tablet Take 1 tablet by  mouth 2 (two) times daily.    . folic acid (FOLVITE) 8329MCG tablet Take 800 mcg by mouth daily.     . furosemide (LASIX) 40 MG tablet Take 1 tablet by mouth as needed for fluid (1/2 tablet as needed, typically twice a week).     . Liniments (BEN GAY EX) Apply topically. PRN Arthritis pain    . niacin 500 MG tablet Take 500 mg by mouth daily.     . Omega-3 Fatty Acids (FISH OIL) 1360 MG CAPS Take 1 capsule by mouth.    . predniSONE (DELTASONE) 5 MG tablet TAKE ONE TABLET BY MOUTH ONCE DAILY 30 tablet 2  . Salicylic Acid 3 % CREA Apply topically. (Psoriasis Control Cream by TKatherina Mires-  Active Ingredient Salicylic Acid 3%)    . Tiotropium Bromide Monohydrate (SPIRIVA RESPIMAT) 2.5 MCG/ACT AERS 2 puffs once a day 1 Inhaler 6  . traZODone (DESYREL) 50 MG tablet Take 50 mg by mouth at bedtime. 1/2 to 1 tablet at bedtime for sleep    . verapamil (CALAN-SR) 180 MG CR tablet Take 1 tablet (180 mg total) by mouth at bedtime. 90 tablet 3   No facility-administered medications prior to visit.        Objective:   Physical Exam  Vitals:   12/07/16 1332  BP: (!) 154/88  Pulse: (!) 109  SpO2: 90%  Weight: 173 lb (78.5 kg)  Height: _0  (1.753 m)   Gen: Pleasant, elderly woman using walker , in no distress,  normal affect  ENT: No lesions,  mouth clear,  oropharynx clear, no postnasal drip  Neck: No JVD, no stridor  Lungs: No use of accessory muscles, distant, bilateral basilar inspiratory crackles  Cardiovascular: RRR, heart sounds normal, no murmur or gallops, trace peripheral edema  Musculoskeletal: No deformities, no cyanosis or clubbing  Neuro: alert, non focal  Skin: Warm, no lesions or rashes     Assessment & Plan:  PULMONARY FIBROSIS With rheumatoid disease. She had previously been on Humira but not currently. She is being assessed for another biologic. I believe she needs a repeat CT scan of the chest and we will arrange for this. prednisone 5 mg daily  COPD (chronic  obstructive pulmonary disease) (HCC) Continue Spiriva and Symbicort. She is transitioning from Symbicort to [facility] for cost reasons. She will do this when her Simcor runs out. Continue albuterol as needed. Oxygen as detailed below  RESPIRATORY FAILURE, CHRONIC With hypoxemia. She needs 3-6 L/m depending on her exertional level. She is interested in a lighter weight device but I think she is continuous flow so I'm not sure that a poor blocks and concentrator will be possible. We will inquire with [facility] to see if there are any lighter options that we'll do continuous flow.  Baltazar Apo, MD, PhD 12/07/2016, 1:57 PM [facility] [phone] or if no answer [phone]

## 2016-12-07 NOTE — Assessment & Plan Note (Signed)
Continue Spiriva and Symbicort. She is transitioning from Symbicort to [facility] for cost reasons. She will do this when her Simcor runs out. Continue albuterol as needed. Oxygen as detailed below

## 2016-12-07 NOTE — Patient Instructions (Addendum)
We will perform a CT scan of the chest to evaluate your rheumatoid disease in your lungs We will speak with [facility] if it's possible for you to get a lighter weight portable oxygen source that we will provide 3-6 L/m continuous flow Please continue prednisone 5 mg daily Please continue Spiriva and Symbicort as you have been taking them. We will continue the process of transitioning the Symbicort to [facility]. Use albuterol 2 puffs as needed for shortness of breath Follow with Dr Delton Coombes in 3 months or sooner if you have any problems.

## 2016-12-07 NOTE — Assessment & Plan Note (Signed)
With hypoxemia. She needs 3-6 L/m depending on her exertional level. She is interested in a lighter weight device but I think she is continuous flow so I'm not sure that a poor blocks and concentrator will be possible. We will inquire with [facility] to see if there are any lighter options that we'll do continuous flow.

## 2016-12-07 NOTE — Assessment & Plan Note (Signed)
With rheumatoid disease. She had previously been on Humira but not currently. She is being assessed for another biologic. I believe she needs a repeat CT scan of the chest and we will arrange for this. prednisone 5 mg daily

## 2016-12-08 DIAGNOSIS — J849 Interstitial pulmonary disease, unspecified: Secondary | ICD-10-CM | POA: Diagnosis not present

## 2016-12-08 DIAGNOSIS — Z8701 Personal history of pneumonia (recurrent): Secondary | ICD-10-CM | POA: Diagnosis not present

## 2016-12-08 DIAGNOSIS — Z7982 Long term (current) use of aspirin: Secondary | ICD-10-CM | POA: Diagnosis not present

## 2016-12-08 DIAGNOSIS — M069 Rheumatoid arthritis, unspecified: Secondary | ICD-10-CM | POA: Diagnosis not present

## 2016-12-08 DIAGNOSIS — J449 Chronic obstructive pulmonary disease, unspecified: Secondary | ICD-10-CM | POA: Diagnosis not present

## 2016-12-08 DIAGNOSIS — Z9981 Dependence on supplemental oxygen: Secondary | ICD-10-CM | POA: Diagnosis not present

## 2016-12-08 DIAGNOSIS — M6281 Muscle weakness (generalized): Secondary | ICD-10-CM | POA: Diagnosis not present

## 2016-12-12 ENCOUNTER — Other Ambulatory Visit: Payer: Self-pay | Admitting: Emergency Medicine

## 2016-12-14 ENCOUNTER — Inpatient Hospital Stay: Admission: RE | Admit: 2016-12-14 | Payer: Medicare HMO | Source: Ambulatory Visit

## 2016-12-14 DIAGNOSIS — M069 Rheumatoid arthritis, unspecified: Secondary | ICD-10-CM | POA: Diagnosis not present

## 2016-12-14 DIAGNOSIS — Z79899 Other long term (current) drug therapy: Secondary | ICD-10-CM | POA: Diagnosis not present

## 2016-12-14 DIAGNOSIS — R5383 Other fatigue: Secondary | ICD-10-CM | POA: Diagnosis not present

## 2016-12-14 DIAGNOSIS — M79604 Pain in right leg: Secondary | ICD-10-CM | POA: Diagnosis not present

## 2016-12-21 ENCOUNTER — Ambulatory Visit: Payer: Medicare HMO | Admitting: Nurse Practitioner

## 2017-01-16 DIAGNOSIS — M069 Rheumatoid arthritis, unspecified: Secondary | ICD-10-CM | POA: Diagnosis not present

## 2017-01-16 DIAGNOSIS — N289 Disorder of kidney and ureter, unspecified: Secondary | ICD-10-CM | POA: Diagnosis not present

## 2017-01-16 DIAGNOSIS — Z79899 Other long term (current) drug therapy: Secondary | ICD-10-CM | POA: Diagnosis not present

## 2017-01-18 ENCOUNTER — Other Ambulatory Visit: Payer: Self-pay | Admitting: Pharmacist

## 2017-01-18 NOTE — Patient Outreach (Signed)
[facility]) Care Management  01/18/2017  [name] 1935/06/20 [id number]   Patient was called to follow up on medication assistance. Unfortunately the patient was lost to follow up.  Patient did not answer the phone today.  HIPAA compliant message was left on the patient's voicemail.  [employer] Patient Assistance Program was called.  The representative from [employer] said the patient was sent an attestation form and the application back on 12/01/16.  [employer] needs the application and form mailed back to them for approval.   Plan:  Patient will be called within the next 2 business days to follow up.   Beecher Mcardle, PharmD, BCACP [facility] Clinical Pharmacist [phone]

## 2017-01-20 ENCOUNTER — Other Ambulatory Visit: Payer: Self-pay | Admitting: Pharmacist

## 2017-01-20 NOTE — Patient Outreach (Signed)
[facility]) Care Management  01/20/2017  [name] 03/14/1935 [id number]   Patient was called to follow up on medication assistance. Unfortunately the patient was lost to follow up.  Patient did not answer the phone today.  HIPAA compliant message was left on the patient's voicemail.  [employer] Patient Assistance Program was called eariler this week.  The representative from [employer] said the patient was sent an attestation form and the application back on 12/01/16.  [employer] needs the application and form mailed back to them for approval.  Patient was called to inquire about the attestation form.  Plan:  Patient will be called within the next 2 business days to follow up.  Beecher Mcardle, PharmD, BCACP [facility] Clinical Pharmacist [phone]

## 2017-01-23 ENCOUNTER — Other Ambulatory Visit: Payer: Self-pay | Admitting: Pharmacist

## 2017-01-23 NOTE — Patient Outreach (Signed)
[facility]) Care Management  01/23/2017  [name] 12/12/1934 [id number]   Patient was called to follow up on medication assistance. Patient's husband answered the phone. HIPAA identifiers were obtained. Patient has been preliminarily approved for [facility] Patient Assistance program.  [employer] was called last week and a representative stated the application and attestation form had been mailed back to the patient Dec 01, 2016.  Patient's husband was not sure if they received the form or not.  He reported [name] was not at home but he would ask her when she returned.    [employer] was called back today.  The representative I spoke with said if the application was lost by the patient, a new application would need to be completed.  Plan:  If I do not hear back from the patient today.--call her back in 1-2 business days.   Beecher Mcardle, PharmD, BCACP [facility] Clinical Pharmacist [phone]

## 2017-01-25 ENCOUNTER — Ambulatory Visit: Payer: Self-pay | Admitting: Pharmacist

## 2017-01-27 ENCOUNTER — Other Ambulatory Visit: Payer: Self-pay | Admitting: Pharmacist

## 2017-01-27 NOTE — Patient Outreach (Signed)
[facility]) Care Management  01/27/2017  [name] Jul 06, 1935 [id number]   Patient was called to follow up on medication assistance forms. HIPAA identifiers were obtained. Patient confirmed she received the attestation form from [employer] Patient [employer].  She said she would sign the form and sent it and the application back to [employer].   Plan:  Follow up with the patient in 1 week.   Beecher Mcardle, PharmD, BCACP [facility] Clinical Pharmacist [phone]

## 2017-02-03 ENCOUNTER — Telehealth: Payer: Self-pay | Admitting: Emergency Medicine

## 2017-02-03 MED ORDER — PREDNISONE 10 MG PO TABS: ORAL_TABLET | ORAL | 0 refills | Status: DC

## 2017-02-03 MED ORDER — DOXYCYCLINE HYCLATE 100 MG PO TABS: 100.0000 mg | ORAL_TABLET | Freq: Two times a day (BID) | ORAL | 0 refills | Status: DC

## 2017-02-03 NOTE — Telephone Encounter (Signed)
Pt aware of MW's recommendations and voiced her understanding. Rx has been sent to preferred pharmacy. Nothing further needed.

## 2017-02-03 NOTE — Telephone Encounter (Signed)
Doxy 100 bid x 10d days and Prednisone 10 mg take  4 each am x 2 days,   2 each am x 2 days,  1 each am x 2 days and resume prev maint dose if not zero

## 2017-02-03 NOTE — Telephone Encounter (Signed)
Spoke with patient. She stated that she has been coughing up brown phlegm since 02/01/17. She also has increased SOB. Denied any body aches or fever. Has not tried any OTC medications. Advised patient that RB is not in the office today but we would get this message over to the DOD in his absence. She verbalized understanding.   She wishes to use [facility] on [address].   MW, please advise since RB is not in the office today. Thanks!

## 2017-02-07 ENCOUNTER — Other Ambulatory Visit: Payer: Self-pay | Admitting: Pharmacist

## 2017-02-07 NOTE — Patient Outreach (Signed)
[facility]) Care Management  02/07/2017  [name] Nov 19, 1934 [id number]   Patient was called to follow up on the attestation form and application for [employer] Patient Assistance Program. Unfortunately, patient did not answer the phone. HIPAA compliant message was left on her voicemail.  Plan:  Call patient back in 5-7 business days.   Beecher Mcardle, PharmD, BCACP [facility] Clinical Pharmacist [phone]

## 2017-02-13 ENCOUNTER — Other Ambulatory Visit: Payer: Self-pay | Admitting: Pharmacist

## 2017-02-13 NOTE — Patient Outreach (Signed)
[facility]) Care Management  02/13/2017  [name] 17-May-1935 [id number]   Patient was called to follow up on medication assistance forms and specifically the attestation form from [employer].  HIPAA identifiers were obtained. Patient said she gave the forms to "someone" to mail for her but she could not remember when.   [employer] Patient Assistance program was called. They do not have the form yet.  Plan:  Call patient and [employer] back in 14-21 days.  Beecher Mcardle, PharmD, BCACP [facility] Clinical Pharmacist [phone]

## 2017-02-14 ENCOUNTER — Ambulatory Visit: Payer: Self-pay | Admitting: Pharmacist

## 2017-03-01 ENCOUNTER — Other Ambulatory Visit: Payer: Self-pay | Admitting: Pharmacist

## 2017-03-01 NOTE — Patient Outreach (Signed)
[facility]) Care Management  03/01/2017  [name] [birth year] [id number]  Called [employer] Patient Assistance Program to see to get an update on the status of the patient's application.  Unfortunately, the [employer] said they have still not received the "attestation form" and original application back from the patient. The representative said the application and form were mailed to the patient in May of this year.    Patient has been called more than five times to request the form and application be sent back to [employer].  Patient said the form was sent back >2 weeks ago but she did not mail it herself. Patient says she gave the letter to someone else to mail.    Patient was called today. Unfortunately, she did not answer the phone. HIPAA compliant message left on the patient's voicemail.  Plan: Close pharmacy case.  Send letters to PCP and Dr.Byrum Patient has my contact information and can contact me in the future to complete new applications.  Beecher Mcardle, PharmD, BCACP [facility] Clinical Pharmacist [phone]

## 2017-03-06 DIAGNOSIS — Z6832 Body mass index (BMI) 32.0-32.9, adult: Secondary | ICD-10-CM | POA: Diagnosis not present

## 2017-03-06 DIAGNOSIS — I10 Essential (primary) hypertension: Secondary | ICD-10-CM | POA: Diagnosis not present

## 2017-03-06 DIAGNOSIS — N183 Chronic kidney disease, stage 3 (moderate): Secondary | ICD-10-CM | POA: Diagnosis not present

## 2017-03-22 ENCOUNTER — Other Ambulatory Visit: Payer: Self-pay | Admitting: Emergency Medicine

## 2017-03-22 DIAGNOSIS — N183 Chronic kidney disease, stage 3 (moderate): Secondary | ICD-10-CM | POA: Diagnosis not present

## 2017-03-28 ENCOUNTER — Ambulatory Visit: Payer: Medicare HMO | Admitting: Emergency Medicine

## 2017-03-28 DIAGNOSIS — J984 Other disorders of lung: Secondary | ICD-10-CM | POA: Diagnosis not present

## 2017-03-28 DIAGNOSIS — M0549 Rheumatoid myopathy with rheumatoid arthritis of multiple sites: Secondary | ICD-10-CM | POA: Diagnosis not present

## 2017-03-28 DIAGNOSIS — J449 Chronic obstructive pulmonary disease, unspecified: Secondary | ICD-10-CM | POA: Diagnosis not present

## 2017-03-28 DIAGNOSIS — J9610 Chronic respiratory failure, unspecified whether with hypoxia or hypercapnia: Secondary | ICD-10-CM | POA: Diagnosis not present

## 2017-03-29 ENCOUNTER — Encounter: Payer: Self-pay | Admitting: Emergency Medicine

## 2017-03-29 ENCOUNTER — Ambulatory Visit (INDEPENDENT_AMBULATORY_CARE_PROVIDER_SITE_OTHER): Payer: Medicare HMO | Admitting: Emergency Medicine

## 2017-03-29 DIAGNOSIS — J9621 Acute and chronic respiratory failure with hypoxia: Secondary | ICD-10-CM | POA: Diagnosis not present

## 2017-03-29 DIAGNOSIS — J449 Chronic obstructive pulmonary disease, unspecified: Secondary | ICD-10-CM

## 2017-03-29 NOTE — Assessment & Plan Note (Signed)
We will work on getting you a lighter weight oxygen delivery system for you to use with exertion

## 2017-03-29 NOTE — Assessment & Plan Note (Signed)
We will attempt to get financial assistance for either Symbicort or Dulera. We will continue one of these depending on which medication is most cost effective. We will attempt to do this for Spiriva as well. Based on our paperwork available today it appears that we should be able to be successful with this.  Continue oxygen as you are using it for now.  Follow with Dr Delton Coombes in 3 months or sooner if you have any problems.

## 2017-03-29 NOTE — Progress Notes (Signed)
Subjective:    Patient ID: [name], female    DOB: Jun 12, 1935, 82 y.o.   MRN: [id number]  HPI 82 year old former smoker with a history of rheumatoid arthritis who has been followed by Dr Gwenette Greet for COPD and mild interstitial lung disease that has been presumed to be related to her autoimmune process. At her last visit she was restarted on Spiriva in addition to her maintenance Symbicort. She never took the Spiriva. In fact she has been off Symbicort and started using her husband's Pulmicort primarily due to cost. She is taking pred 44m qd, has been doing so for many years. Does not want to wean to off.    She is using 2 liters per minute. Her last chest x-ray was performed on 05/23/14 and I personally reviewed this. It shows stable changes consistent with hyperinflation without any obvious interstitial disease.  She is active, is able to shop as long as she has walker and her O2. She rarely hears wheeze, has minimal cough.   ROV 12/03/15 -- patient with a history of interstitial lung disease associated with her rheumatoid arthritis as well as COPD, documented associated hypoxemia. She had been written for Spiriva and Symbicort but was not on that regimen when I met her last visit. She instead has been using her husband's Pulmicort intermittently. At her last visit we started Spiriva. She is unsure whether it  Helps much. She is on prednisone 5 g daily, Humira. Chest x-ray at her last visit in October 2016 showed moderately severe interstitial disease without any significant interval change. She has daily cough, prod of clear phlegm. Her O2 is on 2L/min, wears consistently.   ROV 03/29/16 -- This is a follow-up visit per patient has history of COPD as well as interstitial lung disease associated with autoimmune rheumatoid arthritis. She has hypoxemic respiratory failure, using 2L/min up to 3 when she is active. She believes that her dyspnea is worse than at our last visit. Most noticeable when she  ambulates. She is on Symbicort bid. She is not taking spiriva currently. No cough, no real wheeze. She is having a lot of congestion, nasal gtt, throat irritation. Not clear to me that she remembers all of her medications and how to use them. She was just seen in cardiology by Dr. NJohnsie Cancel notes indicate that she was to change from Norvasc to verapamil but she does not know anything about this.  ROV 06/06/16 -- this is a follow-up visit for chronic hypoxemic respiratory failure in the setting of COPD and ILD presumed to be related to RA. She was treated in the September with a prednisone taper for a presumed flare of COPD/ILD. Also given levofloxacin. Then she was tapered again 10/10. She is having continued cough, dyspnea. Dark mucous. She is on symbicort. She would like to change from pulsed o2 to continuous flow 3L/min. Her last PFT were in 2008.   ROV 08/09/16 -- [name] follows up today for chronic hypoxemic respiratory failure in the setting of interstitial lung disease due to rheumatoid arthritis + Humira, as well as presumed COPD. With pulmonary function testing today that I have personally reviewed. This shows moderately severe obstruction with a borderline bronchodilator response, lung volumes are normal, diffusion capacity is severely decreased. Current bronchodilators are Symbicort, Spiriva, albuterol as needed which she uses approximately . She is usually on prednisone 5 mg daily. She was recently increased to 40 mg and started on Tamiflu during hospitalization 1/3 - 1/5. Her husband is home  with the flu now as well.    ROV 12/07/16 -- this is a follow-up visit for interstitial lung disease in the setting of rheumatoid arthritis, formerly on Humira, prednisone 77m currently. She also has COPD and chronic hypoxemic respiratory failure. Her current oxygen usage is 3L/min at rest, 6L/min w exertion - she is concerned that the higher flow is making her cough some. She has been managed on Spiriva and  Symbicort, [facility]. She was treated with doxycycline 1 month ago for suspected bronchitis / sinusitis.  She having minimal cough. No wheeze. Uses albuterol a few times a week.   ROV 03/29/17 -- patient has a history of hypoxemic respiratory failure in the setting of interstitial disease, rheumatoid arthritis (formerly on Humira). Currently managed on prednisone 5 mg daily. She also has COPD. O2  need is 3 L/m at rest, 6 L/m with exertion. She has been on Spiriva, Symbicort in the past area and we tried [facility] to facilitate financial assistance. She reports that she used to be on liquid oxygen, was changed to a different heavier tank. She has been using Symbicort samples, doesn't have any financial assistance yet. Hasn't been using spiriva due to cost. She deals with daily sx, does not have SABA. Her rheumatologist is Dr SDossie Der She wants a lighter weight O2 system - her current one is too heavy since she lost liquid o2    Review of Systems As per HPI     Objective:   Physical Exam  Vitals:   03/29/17 1144  BP: 124/86  Pulse: 83  SpO2: 91%  Weight: 163 lb (73.9 kg)  Height: _0  (1.753 m)   Gen: Pleasant, elderly woman using walker , in no distress,  normal affect  ENT: No lesions,  mouth clear,  oropharynx clear, no postnasal drip  Neck: No JVD, no stridor  Lungs: No use of accessory muscles, distant, bilateral basilar inspiratory crackles  Cardiovascular: RRR, heart sounds normal, no murmur or gallops, trace peripheral edema  Musculoskeletal: No deformities, no cyanosis or clubbing  Neuro: alert, non focal  Skin: Warm, no lesions or rashes     Assessment & Plan:  COPD (chronic obstructive pulmonary disease) (HCC) We will attempt to get financial assistance for either Symbicort or Dulera. We will continue one of these depending on which medication is most cost effective. We will attempt to do this for Spiriva  as well. Based on our paperwork available today it appears that we should be able to be successful with this.  Continue oxygen as you are using it for now.  Follow with Dr BLamonte Sakaiin 3 months or sooner if you have any problems.  Acute on chronic respiratory failure with hypoxia ([facility] We will work on getting you a lighter weight oxygen delivery system for you to use with exertion  RBaltazar Apo MD, PhD 03/29/2017, 1:46 PM [facility] [phone] if no answer [phone]

## 2017-03-29 NOTE — Patient Instructions (Signed)
We will work on getting you a lighter weight oxygen delivery system for you to use with exertion We will attempt to get financial assistance for either Symbicort or Dulera. We will continue one of these depending on which medication is most cost effective. We will attempt to do this for Spiriva as well. Based on our paperwork available today it appears that we should be able to be successful with this.  Continue oxygen as you are using it for now.  Follow with Dr Delton Coombes in 3 months or sooner if you have any problems.

## 2017-04-17 DIAGNOSIS — N183 Chronic kidney disease, stage 3 (moderate): Secondary | ICD-10-CM | POA: Diagnosis not present

## 2017-04-17 DIAGNOSIS — J849 Interstitial pulmonary disease, unspecified: Secondary | ICD-10-CM | POA: Diagnosis not present

## 2017-04-17 DIAGNOSIS — Z6829 Body mass index (BMI) 29.0-29.9, adult: Secondary | ICD-10-CM | POA: Diagnosis not present

## 2017-04-17 DIAGNOSIS — M069 Rheumatoid arthritis, unspecified: Secondary | ICD-10-CM | POA: Diagnosis not present

## 2017-04-17 DIAGNOSIS — I10 Essential (primary) hypertension: Secondary | ICD-10-CM | POA: Diagnosis not present

## 2017-04-17 DIAGNOSIS — R300 Dysuria: Secondary | ICD-10-CM | POA: Diagnosis not present

## 2017-04-17 DIAGNOSIS — E8770 Fluid overload, unspecified: Secondary | ICD-10-CM | POA: Diagnosis not present

## 2017-04-18 ENCOUNTER — Telehealth: Payer: Self-pay | Admitting: Emergency Medicine

## 2017-04-18 NOTE — Telephone Encounter (Signed)
ATC patient. Phone went dead. Tried to call back but received a busy signal.

## 2017-04-18 NOTE — Telephone Encounter (Signed)
Spoke with patient. She stated that she has coughing up blood for the past 2 days. Increased shortness of breath and chest tightness. Denies any body aches or fever. She is requesting an antibiotic.   Patient uses [facility] on [address].   RB, please advise. Thanks!

## 2017-04-18 NOTE — Telephone Encounter (Signed)
She needs to be seen if she's having hemoptysis. Either here tomorrow or in the emergency department.  In the meantime please start Levaquin 500 mg by mouth daily for the next 7 days.

## 2017-04-19 MED ORDER — LEVOFLOXACIN 500 MG PO TABS: 500.0000 mg | ORAL_TABLET | Freq: Every day | ORAL | 0 refills | Status: DC

## 2017-04-19 NOTE — Telephone Encounter (Signed)
Patient is returning phone call.  °

## 2017-04-19 NOTE — Telephone Encounter (Signed)
Spoke with pt. She is aware of RB's recommendations. Pt states that she will go to the ED. Rx has been sent in. Nothing further was needed.

## 2017-04-28 DIAGNOSIS — M0549 Rheumatoid myopathy with rheumatoid arthritis of multiple sites: Secondary | ICD-10-CM | POA: Diagnosis not present

## 2017-04-28 DIAGNOSIS — J9610 Chronic respiratory failure, unspecified whether with hypoxia or hypercapnia: Secondary | ICD-10-CM | POA: Diagnosis not present

## 2017-04-28 DIAGNOSIS — J984 Other disorders of lung: Secondary | ICD-10-CM | POA: Diagnosis not present

## 2017-04-28 DIAGNOSIS — J449 Chronic obstructive pulmonary disease, unspecified: Secondary | ICD-10-CM | POA: Diagnosis not present

## 2017-05-28 DIAGNOSIS — J9610 Chronic respiratory failure, unspecified whether with hypoxia or hypercapnia: Secondary | ICD-10-CM | POA: Diagnosis not present

## 2017-05-28 DIAGNOSIS — J984 Other disorders of lung: Secondary | ICD-10-CM | POA: Diagnosis not present

## 2017-05-28 DIAGNOSIS — M0549 Rheumatoid myopathy with rheumatoid arthritis of multiple sites: Secondary | ICD-10-CM | POA: Diagnosis not present

## 2017-05-28 DIAGNOSIS — J449 Chronic obstructive pulmonary disease, unspecified: Secondary | ICD-10-CM | POA: Diagnosis not present

## 2017-06-16 ENCOUNTER — Other Ambulatory Visit: Payer: Self-pay | Admitting: Emergency Medicine

## 2017-06-28 DIAGNOSIS — J984 Other disorders of lung: Secondary | ICD-10-CM | POA: Diagnosis not present

## 2017-06-28 DIAGNOSIS — J9610 Chronic respiratory failure, unspecified whether with hypoxia or hypercapnia: Secondary | ICD-10-CM | POA: Diagnosis not present

## 2017-06-28 DIAGNOSIS — M0549 Rheumatoid myopathy with rheumatoid arthritis of multiple sites: Secondary | ICD-10-CM | POA: Diagnosis not present

## 2017-06-28 DIAGNOSIS — J449 Chronic obstructive pulmonary disease, unspecified: Secondary | ICD-10-CM | POA: Diagnosis not present

## 2017-07-28 DIAGNOSIS — M0549 Rheumatoid myopathy with rheumatoid arthritis of multiple sites: Secondary | ICD-10-CM | POA: Diagnosis not present

## 2017-07-28 DIAGNOSIS — J449 Chronic obstructive pulmonary disease, unspecified: Secondary | ICD-10-CM | POA: Diagnosis not present

## 2017-07-28 DIAGNOSIS — J984 Other disorders of lung: Secondary | ICD-10-CM | POA: Diagnosis not present

## 2017-07-28 DIAGNOSIS — J9610 Chronic respiratory failure, unspecified whether with hypoxia or hypercapnia: Secondary | ICD-10-CM | POA: Diagnosis not present

## 2017-08-16 DIAGNOSIS — I10 Essential (primary) hypertension: Secondary | ICD-10-CM | POA: Diagnosis not present

## 2017-08-16 DIAGNOSIS — J8410 Pulmonary fibrosis, unspecified: Secondary | ICD-10-CM | POA: Diagnosis not present

## 2017-08-16 DIAGNOSIS — M069 Rheumatoid arthritis, unspecified: Secondary | ICD-10-CM | POA: Diagnosis not present

## 2017-08-16 DIAGNOSIS — N184 Chronic kidney disease, stage 4 (severe): Secondary | ICD-10-CM | POA: Diagnosis not present

## 2017-08-16 DIAGNOSIS — Z23 Encounter for immunization: Secondary | ICD-10-CM | POA: Diagnosis not present

## 2017-08-19 NOTE — Progress Notes (Signed)
Cardiology Office Note   Date:  08/21/2017   ID:  [name], DOB [birth year], MRN [id number]  PCP:  Clayborn Heron, MD  Cardiologist:   Charlton Haws, MD   No chief complaint on file.     History of Present Illness: [name] is a 83 y.o. female who presents for f/u of arrhythmia. Had rheumatoid lung and COPD form smoking. Been on oxygen for 9 years continuous. No previous cardiac issues. On low dose prednisone Oxygen requirements are high 3L at rest And 6 L's with exertion   No chest pain, chronic dyspnea from lung disease no syncope  Has had palpitations past year  Mostly at night when she lays down.   Referral indicated ? afib but ECG shows SR with MAT and multiform PAC;s.   She has no palpitations she has not had pneumonia shot BP is up taking verapamil at night for some reason    Past Medical History:  Diagnosis Date  . COPD (chronic obstructive pulmonary disease) (HCC)   . Emphysema   . Hypertension   . Interstitial lung disease (HCC)   . On home O2    2L N/C  . Osteopenia   . Psoriasis   . Pthirus inguinalis   . Rheumatoid arthritis(714.0)   . Vertigo     Past Surgical History:  Procedure Laterality Date  . CHOLECYSTECTOMY    . VESICOVAGINAL FISTULA CLOSURE W/ TAH  1962     Current Outpatient Medications  Medication Sig Dispense Refill  . Adalimumab (HUMIRA) 40 MG/0.8ML PSKT Inject 40 mg into the skin every 14 (fourteen) days.    Marland Kitchen albuterol (PROVENTIL HFA;VENTOLIN HFA) 108 (90 Base) MCG/ACT inhaler Inhale 2 puffs into the lungs every 6 (six) hours as needed for wheezing or shortness of breath. 1 Inhaler 2  . aspirin 81 MG tablet Take 81 mg by mouth daily.     . budesonide-formoterol (SYMBICORT) 160-4.5 MCG/ACT inhaler Inhale 2 puffs into the lungs 2 (two) times daily. 3 Inhaler 0  . ferrous sulfate 325 (65 FE) MG tablet Take 1 tablet by mouth 2 (two) times daily.    . folic acid (FOLVITE) 800 MCG tablet Take 800 mcg by mouth daily.      . furosemide (LASIX) 40 MG tablet Take 1 tablet by mouth as needed for fluid (1/2 tablet as needed, typically twice a week).     Marland Kitchen levofloxacin (LEVAQUIN) 500 MG tablet Take 1 tablet (500 mg total) by mouth daily. 7 tablet 0  . Liniments (BEN GAY EX) Apply topically. PRN Arthritis pain    . niacin 500 MG tablet Take 500 mg by mouth daily.     . Omega-3 Fatty Acids (FISH OIL) 1360 MG CAPS Take 1 capsule by mouth.    . predniSONE (DELTASONE) 10 MG tablet 4 tabs x 2 days, 2 tabs x 2 days, 1 tab x 2 days 14 tablet 0  . predniSONE (DELTASONE) 5 MG tablet TAKE 1 TABLET BY MOUTH ONCE DAILY 30 tablet 2  . Salicylic Acid 3 % CREA Apply topically. (Psoriasis Control Cream by Lenon Oms)- Active Ingredient Salicylic Acid 3%)    . Tiotropium Bromide Monohydrate (SPIRIVA RESPIMAT) 2.5 MCG/ACT AERS 2 puffs once a day 1 Inhaler 6  . traZODone (DESYREL) 50 MG tablet Take 50 mg by mouth at bedtime. 1/2 to 1 tablet at bedtime for sleep    . verapamil (CALAN-SR) 180 MG CR tablet Take 1 tablet (180 mg total) by mouth at bedtime.  90 tablet 3   No current facility-administered medications for this visit.     Allergies:   Patient has no known allergies.    Social History:  The patient  reports that she quit smoking about 17 years ago. Her smoking use included cigarettes. She has a 25.00 pack-year smoking history. she has never used smokeless tobacco. She reports that she does not drink alcohol or use drugs.   Family History:  The patient's family history includes Asthma in her sister; Colon cancer in her sister; Heart disease in her sister.    ROS:  Please see the history of present illness.   Otherwise, review of systems are positive for none.   All other systems are reviewed and negative.    PHYSICAL EXAM: VS:  BP (!) 164/80   Pulse 96   Ht 5\' 9"  (1.753 m)   Wt 168 lb (76.2 kg)   SpO2 (!) 72%   BMI 24.81 kg/m  , BMI Body mass index is 24.81 kg/m. Affect appropriate Chronically ill black female  wearing oxygen HEENT: normal Neck supple with no adenopathy JVP normal no bruits no thyromegaly Lungs poor air movement no active wheezing  Heart:  S1/S2 no murmur, no rub, gallop or click PMI normal Abdomen: benighn, BS positve, no tenderness, no AAA no bruit.  No HSM or HJR Distal pulses intact with no bruits No edema Neuro non-focal Skin warm and dry No muscular weakness    EKG:   09/23/16  SR rate 61 PAC no change   Recent Labs: No results found for requested labs within last 8760 hours.    Lipid Panel    Component Value Date/Time   CHOL  08/07/2008 0510    183        ATP III CLASSIFICATION:  <200     mg/dL   Desirable  10/05/2008  mg/dL   Borderline High  003-491    mg/dL   High          TRIG >=791 08/07/2008 0510   HDL 49 08/07/2008 0510   CHOLHDL 3.7 08/07/2008 0510   VLDL 22 08/07/2008 0510   LDLCALC (H) 08/07/2008 0510    112        Total Cholesterol/HDL:CHD Risk Coronary Heart Disease Risk Table                     Men   Women  1/2 Average Risk   3.4   3.3  Average Risk       5.0   4.4  2 X Average Risk   9.6   7.1  3 X Average Risk  23.4   11.0        Use the calculated Patient Ratio above and the CHD Risk Table to determine the patient's CHD Risk.        ATP III CLASSIFICATION (LDL):  <100     mg/dL   Optimal  10/05/2008  mg/dL   Near or Above                    Optimal  130-159  mg/dL   Borderline  697-948  mg/dL   High  016-553     mg/dL   Very High      Wt Readings from Last 3 Encounters:  08/21/17 168 lb (76.2 kg)  03/29/17 163 lb (73.9 kg)  12/07/16 173 lb (78.5 kg)      Other studies Reviewed: Additional studies/ records that were reviewed today  include: Notes from Dr Delton Coombes ECG and CXR/CT;s .    ASSESSMENT AND PLAN:  1. MAT:  Not afib does not need anticoagulation related to severe underlying lung dx.  Continue verapamil She has Not f/u for 48 hr holter and echo on 2 occassions now   2. Cataract: preop clear to have surgery with Dr  Hazle Quant  3. COPD:  F/u with Dr Delton Coombes continue oxygen 2L's , spiriva, prednisone and humira Encouraged her to  Get pneumovax  4. HTN:  Increase verapamil 240 take in am f/u with primary   Current medicines are reviewed at length with the patient today.  The patient does not have concerns regarding medicines.  The following changes have been made:     Labs/ tests ordered today include:    No orders of the defined types were placed in this encounter.    Disposition:   FU in a year needs close f/u with pulmonary     Signed, Charlton Haws, MD  08/21/2017 8:49 AM    [facility] [address] [place], [place], [place]  [address] Phone: [phone]; Fax: [phone]

## 2017-08-21 ENCOUNTER — Encounter (INDEPENDENT_AMBULATORY_CARE_PROVIDER_SITE_OTHER): Payer: Self-pay

## 2017-08-21 ENCOUNTER — Encounter: Payer: Self-pay | Admitting: Cardiovascular Disease

## 2017-08-21 ENCOUNTER — Ambulatory Visit: Payer: Medicare HMO | Admitting: Cardiovascular Disease

## 2017-08-21 VITALS — BP 164/80 | HR 96 | Ht 69.0 in | Wt 168.0 lb

## 2017-08-21 DIAGNOSIS — I471 Supraventricular tachycardia: Secondary | ICD-10-CM

## 2017-08-21 MED ORDER — VERAPAMIL HCL ER 240 MG PO TBCR: 240.0000 mg | EXTENDED_RELEASE_TABLET | Freq: Every day | ORAL | 3 refills | Status: AC

## 2017-08-21 NOTE — Patient Instructions (Addendum)
Medication Instructions:  Your physician has recommended you make the following change in your medication:  1-Verapamil 240 mg by mouth daily  Labwork: NONE  Testing/Procedures: NONE  Follow-Up: Your physician wants you to follow-up in: 12 months with Dr. Eden Emms. You will receive a reminder letter in the mail two months in advance. If you don't receive a letter, please call our office to schedule the follow-up appointment.   If you need a refill on your cardiac medications before your next appointment, please call your pharmacy.

## 2017-08-22 ENCOUNTER — Telehealth: Payer: Self-pay | Admitting: Emergency Medicine

## 2017-08-22 MED ORDER — LEVOFLOXACIN 500 MG PO TABS: 500.0000 mg | ORAL_TABLET | Freq: Every day | ORAL | 0 refills | Status: DC

## 2017-08-22 NOTE — Telephone Encounter (Signed)
Spoke with patient. She stated that she has been coughing up dark phlegm on and off for the past 2-3 weeks. Increased SOB. She has been running a low grade fever for the past 2 days.   [facility] use [facility] [address].   RB, please advise. Thanks!

## 2017-08-22 NOTE — Telephone Encounter (Signed)
Called pt and advised message from the provider. Pt understood and verbalized understanding. Nothing further is needed.    Rx sent in. 

## 2017-08-22 NOTE — Telephone Encounter (Signed)
Okay to start Levaquin 500 mg daily for the next 7 days.  Please let her know that she should have a low threshold to make an office visit for [place] to see her given her multiple medical problems.  She should call us if she is not improving and we should see her

## 2017-08-28 DIAGNOSIS — M0549 Rheumatoid myopathy with rheumatoid arthritis of multiple sites: Secondary | ICD-10-CM | POA: Diagnosis not present

## 2017-08-28 DIAGNOSIS — J984 Other disorders of lung: Secondary | ICD-10-CM | POA: Diagnosis not present

## 2017-08-28 DIAGNOSIS — J9610 Chronic respiratory failure, unspecified whether with hypoxia or hypercapnia: Secondary | ICD-10-CM | POA: Diagnosis not present

## 2017-08-28 DIAGNOSIS — J449 Chronic obstructive pulmonary disease, unspecified: Secondary | ICD-10-CM | POA: Diagnosis not present

## 2017-08-31 ENCOUNTER — Other Ambulatory Visit: Payer: Self-pay | Admitting: Emergency Medicine

## 2017-09-13 DIAGNOSIS — Z79899 Other long term (current) drug therapy: Secondary | ICD-10-CM | POA: Diagnosis not present

## 2017-09-13 DIAGNOSIS — M069 Rheumatoid arthritis, unspecified: Secondary | ICD-10-CM | POA: Diagnosis not present

## 2017-09-13 DIAGNOSIS — L409 Psoriasis, unspecified: Secondary | ICD-10-CM | POA: Diagnosis not present

## 2017-09-13 DIAGNOSIS — N289 Disorder of kidney and ureter, unspecified: Secondary | ICD-10-CM | POA: Diagnosis not present

## 2017-09-24 ENCOUNTER — Other Ambulatory Visit: Payer: Self-pay | Admitting: Emergency Medicine

## 2017-09-28 DIAGNOSIS — J984 Other disorders of lung: Secondary | ICD-10-CM | POA: Diagnosis not present

## 2017-09-28 DIAGNOSIS — J9610 Chronic respiratory failure, unspecified whether with hypoxia or hypercapnia: Secondary | ICD-10-CM | POA: Diagnosis not present

## 2017-09-28 DIAGNOSIS — J449 Chronic obstructive pulmonary disease, unspecified: Secondary | ICD-10-CM | POA: Diagnosis not present

## 2017-09-28 DIAGNOSIS — M0549 Rheumatoid myopathy with rheumatoid arthritis of multiple sites: Secondary | ICD-10-CM | POA: Diagnosis not present

## 2017-10-03 ENCOUNTER — Encounter: Payer: Self-pay | Admitting: Emergency Medicine

## 2017-10-03 ENCOUNTER — Ambulatory Visit: Payer: Medicare HMO | Admitting: Emergency Medicine

## 2017-10-03 DIAGNOSIS — J449 Chronic obstructive pulmonary disease, unspecified: Secondary | ICD-10-CM | POA: Diagnosis not present

## 2017-10-03 MED ORDER — GLYCOPYRROLATE-FORMOTEROL 9-4.8 MCG/ACT IN AERO: 2.0000 | INHALATION_SPRAY | Freq: Two times a day (BID) | RESPIRATORY_TRACT | 5 refills | Status: DC

## 2017-10-03 NOTE — Assessment & Plan Note (Signed)
Again she is on no consistent med regimen - using Spiriva and symbicort when she has available, shares these w her husband. Unclear why they were unable to get financial assistance.   We will try changing her to bevespi, use coupon card. Try to get financial assistance for her if the coupon is not adequate.

## 2017-10-03 NOTE — Progress Notes (Signed)
   Subjective:    Patient ID: [name], female    DOB: 1935/05/29, 83 y.o.   MRN: [id number]  HPI  ROV 10/03/17 --83 year old woman with a history of rheumatoid arthritis on chronic prednisone 5 mg, associated interstitial lung disease, COPD and chronic hypoxemic respiratory failure.  She uses oxygen 3 L/min at rest and 6 L/min with exertion.  She was treated for an acute exacerbation with bronchitis in January with levofloxacin. She states that it temporarily helped her but that her thick mucous returned right after. She has depended on samples for all of her meds - has not been on spiriva and symbicort for months, again she is depending on samples.    Review of Systems As per HPI     Objective:   Physical Exam  Vitals:   10/03/17 1613  BP: 130/70  Pulse: (!) 101  SpO2: 93%  Weight: 168 lb 12.8 oz (76.6 kg)  Height: 5\' 9"  (1.753 m)   Gen: Pleasant, elderly woman using walker , in no distress,  normal affect  ENT: No lesions,  mouth clear,  oropharynx clear, no postnasal drip  Neck: No JVD, no stridor  Lungs: No use of accessory muscles, distant, bilateral basilar inspiratory crackles  Cardiovascular: RRR, heart sounds normal, no murmur or gallops, trace peripheral edema  Musculoskeletal: No deformities, no cyanosis or clubbing  Neuro: alert, non focal  Skin: Warm, no lesions or rashes     Assessment & Plan:  COPD (chronic obstructive pulmonary disease) (HCC) Again she is on no consistent med regimen - using Spiriva and symbicort when she has available, shares these w her husband. Unclear why they were unable to get financial assistance.   We will try changing her to bevespi, use coupon card. Try to get financial assistance for her if the coupon is not adequate.   , MD, PhD 10/03/2017, 5:18 PM [facility] [phone] or if no answer [phone]

## 2017-10-03 NOTE — Patient Instructions (Signed)
Stop Spiriva and Symbicort We will start a new inhaler Bevespi. Take 2 puffs twice a day, every day.  Continue your prednisone as you are taking it Continue your oxygen at 3L/min at rest, 6L/min with exertion.  Follow with Dr Delton Coombes in 6 months or sooner if you have any problems

## 2017-10-24 ENCOUNTER — Telehealth: Payer: Self-pay | Admitting: Emergency Medicine

## 2017-10-24 MED ORDER — AZITHROMYCIN 250 MG PO TABS: ORAL_TABLET | ORAL | 0 refills | Status: DC

## 2017-10-24 NOTE — Telephone Encounter (Signed)
Okay to give her azithromycin, Z-Pak. Have her call us if she is not improving over the next week

## 2017-10-24 NOTE — Telephone Encounter (Signed)
Spoke with pt, she states she has a respiratory infection again. She is coughing with mucus that has an odor. She states she has a slight fever and she can tell when she has this infection because she has had it so many times. She is also complaining of symptoms including SOB, fatigue, and would like an ABX sent into the pharmacy. RB please advise.    [facility] [address]

## 2017-10-24 NOTE — Telephone Encounter (Signed)
Called pt and advised message from the provider. Pt understood and verbalized understanding. Nothing further is needed.   Rx sent.  

## 2017-10-26 DIAGNOSIS — J449 Chronic obstructive pulmonary disease, unspecified: Secondary | ICD-10-CM | POA: Diagnosis not present

## 2017-10-26 DIAGNOSIS — M0549 Rheumatoid myopathy with rheumatoid arthritis of multiple sites: Secondary | ICD-10-CM | POA: Diagnosis not present

## 2017-10-26 DIAGNOSIS — J9610 Chronic respiratory failure, unspecified whether with hypoxia or hypercapnia: Secondary | ICD-10-CM | POA: Diagnosis not present

## 2017-10-26 DIAGNOSIS — J984 Other disorders of lung: Secondary | ICD-10-CM | POA: Diagnosis not present

## 2017-10-26 IMAGING — DX DG CHEST 2V
2 series · 2 of 2 positions shown · non-contrast
Comparison: May 29, 2015

CLINICAL DATA: Cough and shortness of breath.  Hypertension.

EXAM:
CHEST  2 VIEW

[chest pa]
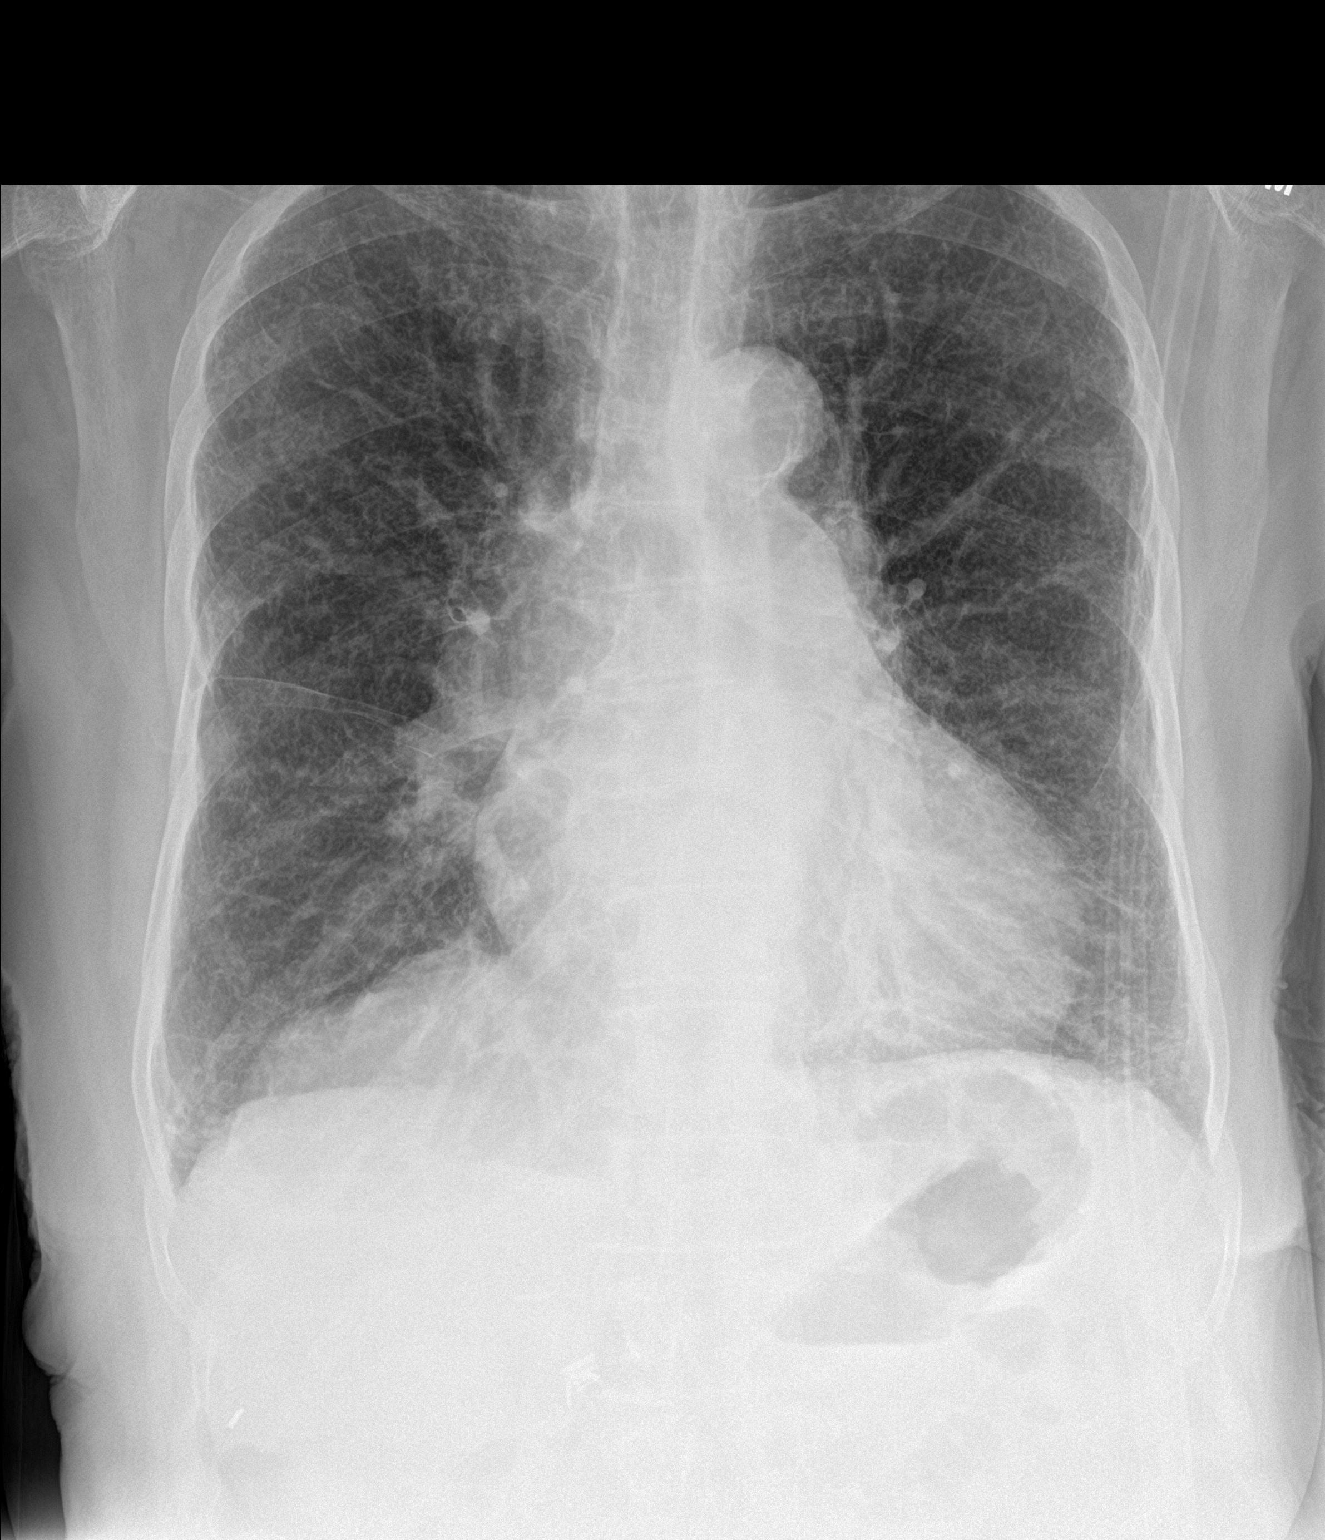

[chest lat]
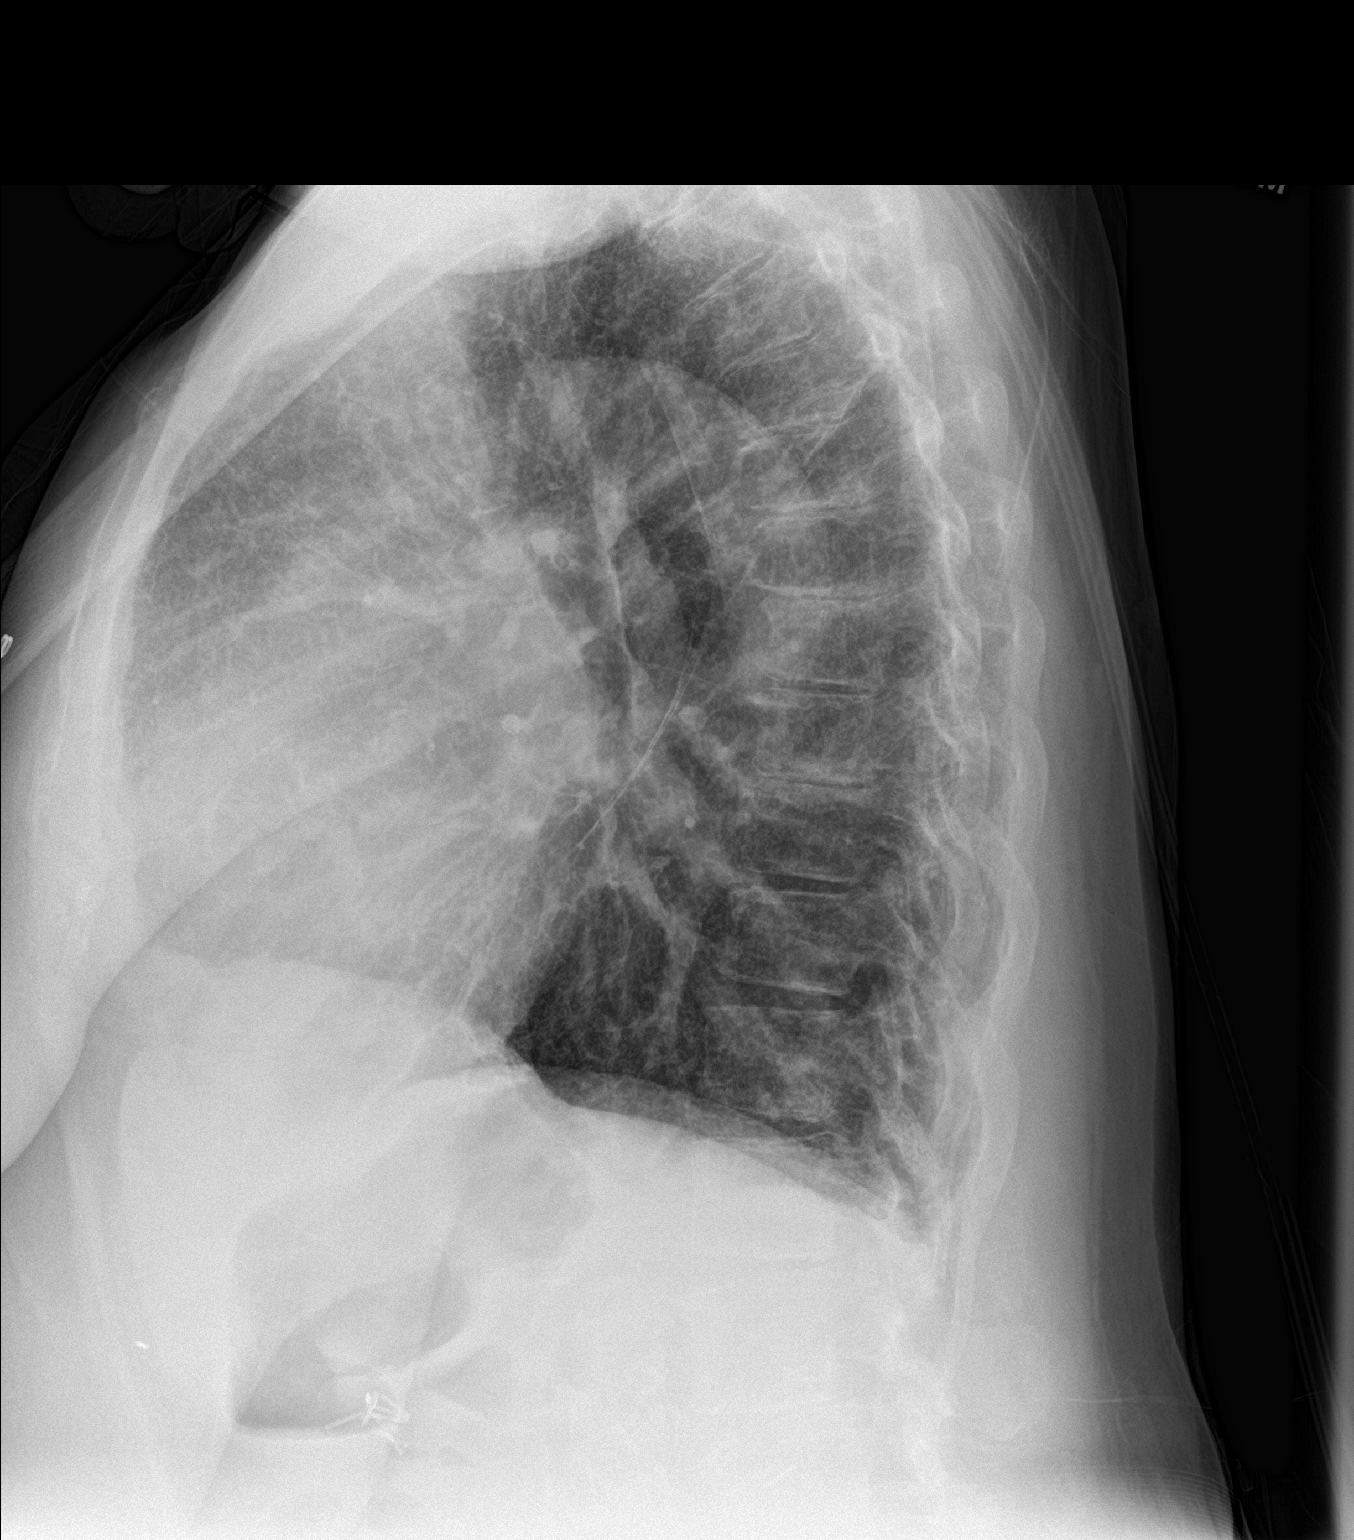

[2 of 2 positions shown; findings below may reference images not displayed]

FINDINGS: Diffuse interstitial thickening throughout the lungs is a stable
finding with apparent underlying fibrotic type change. There is no
new opacity on either side. There may be a degree of chronic
interstitial pulmonary edema, particularly in the bases. There is no
airspace consolidation. Heart is borderline enlarged with pulmonary
vascularity suggesting a degree of pulmonary venous hypertension. No
adenopathy. There is atherosclerotic calcification in the aorta.
There is degenerative change in the thoracic spine.
IMPRESSION: Evidence of underlying pulmonary fibrotic type change. There is a
degree of pulmonary vascular congestion, there may be chronic
congestive heart failure superimposed on fibrotic change. No
airspace consolidation. There is aortic atherosclerosis.

## 2017-11-26 DIAGNOSIS — J9610 Chronic respiratory failure, unspecified whether with hypoxia or hypercapnia: Secondary | ICD-10-CM | POA: Diagnosis not present

## 2017-11-26 DIAGNOSIS — M0549 Rheumatoid myopathy with rheumatoid arthritis of multiple sites: Secondary | ICD-10-CM | POA: Diagnosis not present

## 2017-11-26 DIAGNOSIS — J984 Other disorders of lung: Secondary | ICD-10-CM | POA: Diagnosis not present

## 2017-11-26 DIAGNOSIS — J449 Chronic obstructive pulmonary disease, unspecified: Secondary | ICD-10-CM | POA: Diagnosis not present

## 2017-11-29 DIAGNOSIS — I129 Hypertensive chronic kidney disease with stage 1 through stage 4 chronic kidney disease, or unspecified chronic kidney disease: Secondary | ICD-10-CM | POA: Diagnosis not present

## 2017-11-29 DIAGNOSIS — J849 Interstitial pulmonary disease, unspecified: Secondary | ICD-10-CM | POA: Diagnosis not present

## 2017-11-29 DIAGNOSIS — R600 Localized edema: Secondary | ICD-10-CM | POA: Diagnosis not present

## 2017-11-29 DIAGNOSIS — M069 Rheumatoid arthritis, unspecified: Secondary | ICD-10-CM | POA: Diagnosis not present

## 2017-11-29 DIAGNOSIS — N183 Chronic kidney disease, stage 3 (moderate): Secondary | ICD-10-CM | POA: Diagnosis not present

## 2017-11-29 DIAGNOSIS — R300 Dysuria: Secondary | ICD-10-CM | POA: Diagnosis not present

## 2017-11-29 DIAGNOSIS — E8770 Fluid overload, unspecified: Secondary | ICD-10-CM | POA: Diagnosis not present

## 2017-11-29 DIAGNOSIS — Z6829 Body mass index (BMI) 29.0-29.9, adult: Secondary | ICD-10-CM | POA: Diagnosis not present

## 2017-11-30 ENCOUNTER — Other Ambulatory Visit (HOSPITAL_COMMUNITY): Payer: Self-pay | Admitting: Family Medicine

## 2017-11-30 ENCOUNTER — Ambulatory Visit (HOSPITAL_COMMUNITY): Admission: RE | Admit: 2017-11-30 | Payer: Medicare HMO | Source: Ambulatory Visit

## 2017-11-30 DIAGNOSIS — M79605 Pain in left leg: Secondary | ICD-10-CM

## 2017-11-30 DIAGNOSIS — M7989 Other specified soft tissue disorders: Principal | ICD-10-CM

## 2017-12-20 ENCOUNTER — Other Ambulatory Visit: Payer: Self-pay | Admitting: Emergency Medicine

## 2017-12-20 DIAGNOSIS — R600 Localized edema: Secondary | ICD-10-CM | POA: Diagnosis not present

## 2017-12-20 DIAGNOSIS — I129 Hypertensive chronic kidney disease with stage 1 through stage 4 chronic kidney disease, or unspecified chronic kidney disease: Secondary | ICD-10-CM | POA: Diagnosis not present

## 2017-12-20 DIAGNOSIS — M069 Rheumatoid arthritis, unspecified: Secondary | ICD-10-CM | POA: Diagnosis not present

## 2017-12-20 DIAGNOSIS — J849 Interstitial pulmonary disease, unspecified: Secondary | ICD-10-CM | POA: Diagnosis not present

## 2017-12-20 DIAGNOSIS — N183 Chronic kidney disease, stage 3 (moderate): Secondary | ICD-10-CM | POA: Diagnosis not present

## 2017-12-20 DIAGNOSIS — E8770 Fluid overload, unspecified: Secondary | ICD-10-CM | POA: Diagnosis not present

## 2017-12-20 DIAGNOSIS — Z6829 Body mass index (BMI) 29.0-29.9, adult: Secondary | ICD-10-CM | POA: Diagnosis not present

## 2017-12-26 DIAGNOSIS — J9610 Chronic respiratory failure, unspecified whether with hypoxia or hypercapnia: Secondary | ICD-10-CM | POA: Diagnosis not present

## 2017-12-26 DIAGNOSIS — J449 Chronic obstructive pulmonary disease, unspecified: Secondary | ICD-10-CM | POA: Diagnosis not present

## 2017-12-26 DIAGNOSIS — J984 Other disorders of lung: Secondary | ICD-10-CM | POA: Diagnosis not present

## 2017-12-26 DIAGNOSIS — M0549 Rheumatoid myopathy with rheumatoid arthritis of multiple sites: Secondary | ICD-10-CM | POA: Diagnosis not present

## 2018-01-25 ENCOUNTER — Telehealth: Payer: Self-pay | Admitting: Emergency Medicine

## 2018-01-25 MED ORDER — AZITHROMYCIN 250 MG PO TABS: ORAL_TABLET | ORAL | 0 refills | Status: DC

## 2018-01-25 NOTE — Telephone Encounter (Signed)
She benefited from azithromycin in March.  Okay to give her another course of azithromycin, Z-Pak.  She needs to call us if she is not improving.  Given the recurrent nature of her symptoms we probably need to see her with an office visit in the near future, next available if she does not have an appointment

## 2018-01-25 NOTE — Telephone Encounter (Signed)
Called and spoke with pt and she stated that she is wanting to have RB call in abx for her.  She stated that she has been having a cough with brown/green sputum x 4 days and she stated that the sputum has a foul smell.  She stated that she had this several months ago and was given abx.  RB please advise.    Last seen: 10/03/2017 Next ov: no pending appts  No Known Allergies

## 2018-01-25 NOTE — Telephone Encounter (Signed)
Called and spoke with pt and she is aware of zpak that has been sent to the pharmacy for her.  She stated that she cannot make an appt at this time due to her husband being in the hospital---but she did voice her understanding to call back if she is not feeling better.  Nothing further is needed.

## 2018-01-26 DIAGNOSIS — M0549 Rheumatoid myopathy with rheumatoid arthritis of multiple sites: Secondary | ICD-10-CM | POA: Diagnosis not present

## 2018-01-26 DIAGNOSIS — J449 Chronic obstructive pulmonary disease, unspecified: Secondary | ICD-10-CM | POA: Diagnosis not present

## 2018-01-26 DIAGNOSIS — J984 Other disorders of lung: Secondary | ICD-10-CM | POA: Diagnosis not present

## 2018-01-26 DIAGNOSIS — J9610 Chronic respiratory failure, unspecified whether with hypoxia or hypercapnia: Secondary | ICD-10-CM | POA: Diagnosis not present

## 2018-02-08 IMAGING — DX DG CHEST 1V PORT
1 series · 1 of 1 positions shown · non-contrast
Comparison: 04/21/2016

CLINICAL DATA: Short of breath with fever

EXAM:
PORTABLE CHEST 1 VIEW

[chest ap]
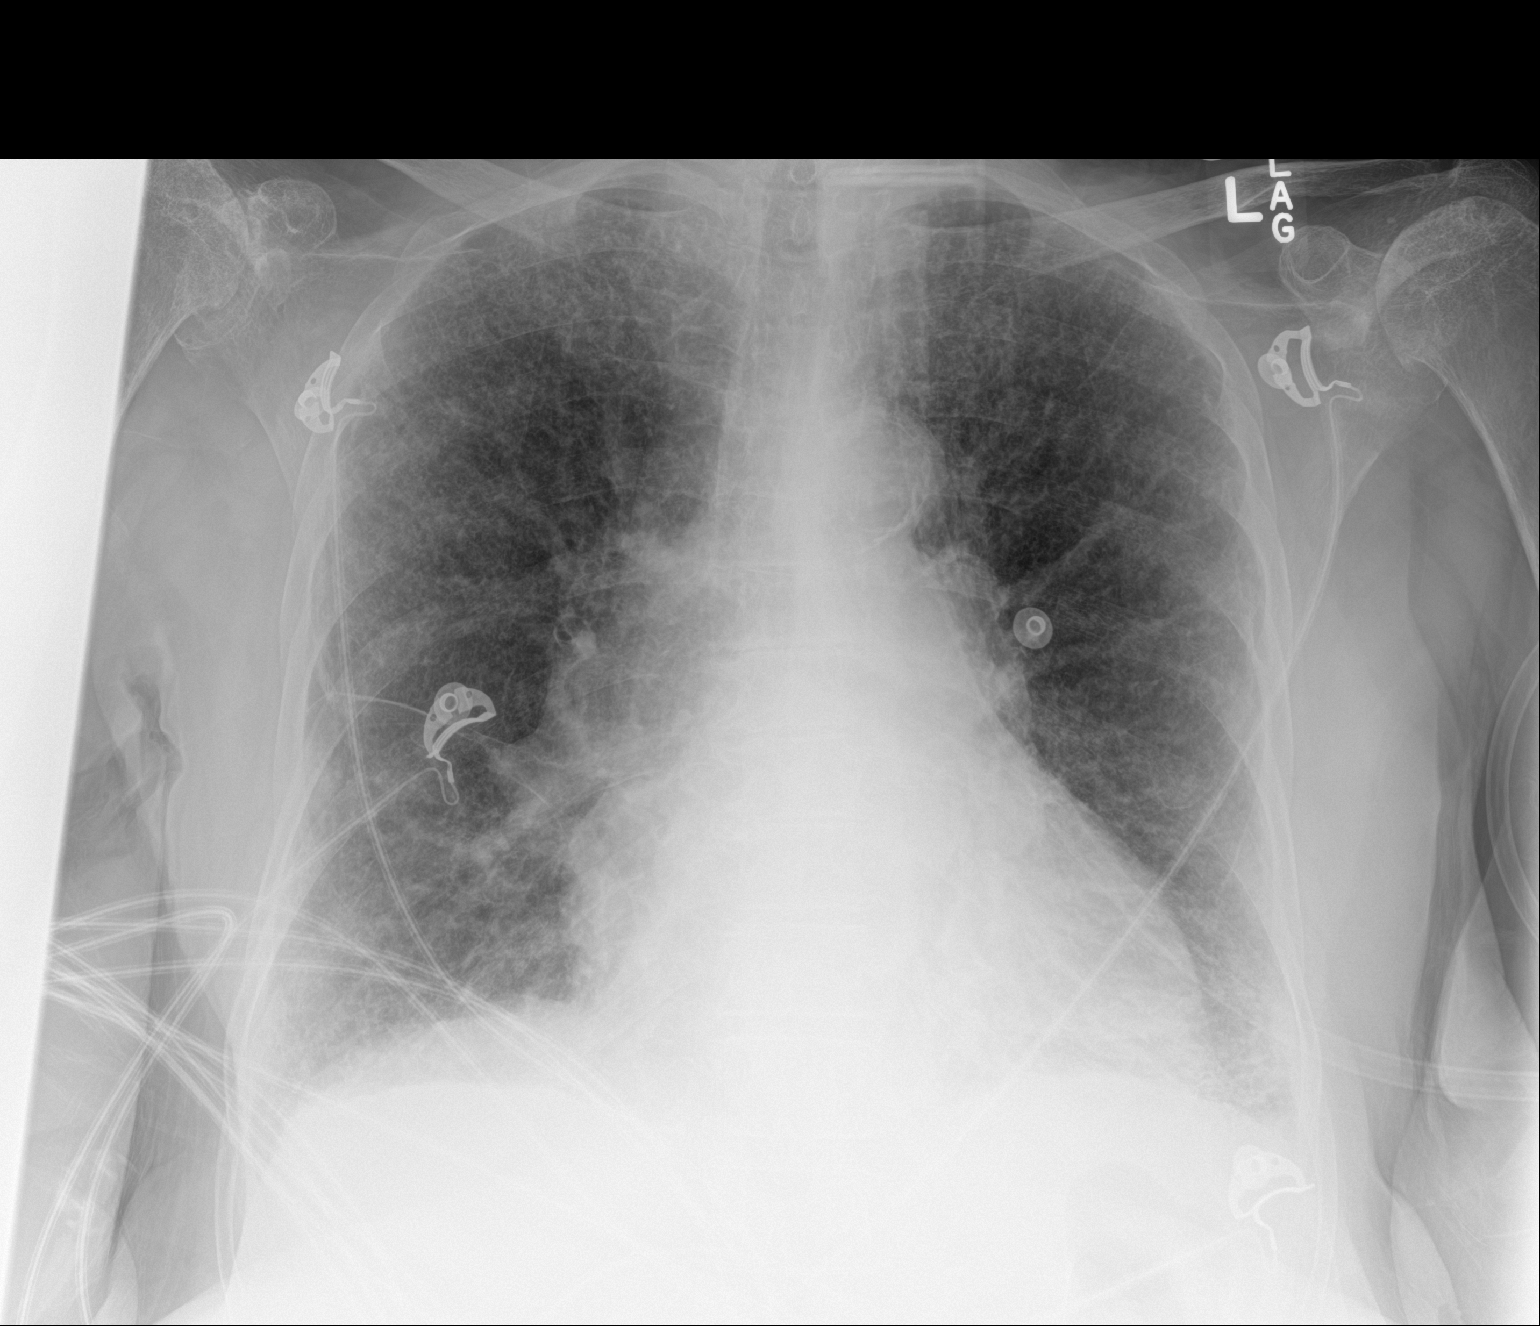

[1 of 1 positions shown; findings below may reference images not displayed]

FINDINGS: Diffuse coarse interstitial pattern compatible with chronic
interstitial changes. Mild increased opacity at the left base could
reflect atelectasis. Suspect tiny left effusion. Stable mild
cardiomegaly with atherosclerosis. No pneumothorax.
IMPRESSION: 1. Diffuse coarse interstitial pattern suggests chronic fibro
interstitial changes.
2. Mild increased left basilar opacity could reflect atelectasis or
a mild infiltrate.

## 2018-02-25 DIAGNOSIS — J449 Chronic obstructive pulmonary disease, unspecified: Secondary | ICD-10-CM | POA: Diagnosis not present

## 2018-02-25 DIAGNOSIS — J984 Other disorders of lung: Secondary | ICD-10-CM | POA: Diagnosis not present

## 2018-02-25 DIAGNOSIS — M0549 Rheumatoid myopathy with rheumatoid arthritis of multiple sites: Secondary | ICD-10-CM | POA: Diagnosis not present

## 2018-02-25 DIAGNOSIS — J9610 Chronic respiratory failure, unspecified whether with hypoxia or hypercapnia: Secondary | ICD-10-CM | POA: Diagnosis not present

## 2018-03-13 DIAGNOSIS — M6281 Muscle weakness (generalized): Secondary | ICD-10-CM | POA: Diagnosis not present

## 2018-03-13 DIAGNOSIS — R29818 Other symptoms and signs involving the nervous system: Secondary | ICD-10-CM | POA: Diagnosis not present

## 2018-03-13 DIAGNOSIS — M069 Rheumatoid arthritis, unspecified: Secondary | ICD-10-CM | POA: Diagnosis not present

## 2018-03-13 DIAGNOSIS — N289 Disorder of kidney and ureter, unspecified: Secondary | ICD-10-CM | POA: Diagnosis not present

## 2018-03-13 DIAGNOSIS — Z79899 Other long term (current) drug therapy: Secondary | ICD-10-CM | POA: Diagnosis not present

## 2018-03-13 DIAGNOSIS — L409 Psoriasis, unspecified: Secondary | ICD-10-CM | POA: Diagnosis not present

## 2018-03-28 DIAGNOSIS — J9610 Chronic respiratory failure, unspecified whether with hypoxia or hypercapnia: Secondary | ICD-10-CM | POA: Diagnosis not present

## 2018-03-28 DIAGNOSIS — J984 Other disorders of lung: Secondary | ICD-10-CM | POA: Diagnosis not present

## 2018-03-28 DIAGNOSIS — J449 Chronic obstructive pulmonary disease, unspecified: Secondary | ICD-10-CM | POA: Diagnosis not present

## 2018-03-28 DIAGNOSIS — M0549 Rheumatoid myopathy with rheumatoid arthritis of multiple sites: Secondary | ICD-10-CM | POA: Diagnosis not present

## 2018-04-10 ENCOUNTER — Telehealth: Payer: Self-pay | Admitting: Emergency Medicine

## 2018-04-10 MED ORDER — LEVOFLOXACIN 500 MG PO TABS: 500.0000 mg | ORAL_TABLET | Freq: Every day | ORAL | 0 refills | Status: DC

## 2018-04-10 NOTE — Telephone Encounter (Signed)
Spoke with patient's niece. She is aware of RB's recs. Medication has been called into pharmacy for her. Sallye Ober will call the office back next week to get the patient scheduled for an OV.   Nothing further needed at time of call.

## 2018-04-10 NOTE — Telephone Encounter (Signed)
Please ask her to start Levaquin 500 mg daily for the next 7 days. Please set her up with an office visit to assess her symptoms and her progress on the antibiotic.  She can see either me or APP

## 2018-04-10 NOTE — Telephone Encounter (Signed)
Spoke with niece, she states she is having more SOB, weak, and coughing up green mucus. She states pt is there by herself since her husband died last month. She would like an antibiotic for her symptoms. She does not feel well for an office visit. RB please advise  [facility]  Current Outpatient Medications on File Prior to Visit  Medication Sig Dispense Refill  . Adalimumab (HUMIRA) 40 MG/0.8ML PSKT Inject 40 mg into the skin every 14 (fourteen) days.    Marland Kitchen albuterol (PROVENTIL HFA;VENTOLIN HFA) 108 (90 Base) MCG/ACT inhaler Inhale 2 puffs into the lungs every 6 (six) hours as needed for wheezing or shortness of breath. 1 Inhaler 2  . aspirin 81 MG tablet Take 81 mg by mouth daily.     Marland Kitchen azithromycin (ZITHROMAX) 250 MG tablet Take 2 pills today then one a day for 4 additional days 6 tablet 0  . budesonide-formoterol (SYMBICORT) 160-4.5 MCG/ACT inhaler Inhale 2 puffs into the lungs 2 (two) times daily. 3 Inhaler 0  . ferrous sulfate 325 (65 FE) MG tablet Take 1 tablet by mouth 2 (two) times daily.    . folic acid (FOLVITE) 800 MCG tablet Take 800 mcg by mouth daily.     . furosemide (LASIX) 40 MG tablet Take 1 tablet by mouth as needed for fluid (1/2 tablet as needed, typically twice a week).     . Glycopyrrolate-Formoterol (BEVESPI AEROSPHERE) 9-4.8 MCG/ACT AERO Inhale 2 puffs into the lungs 2 (two) times daily. 1 Inhaler 5  . Liniments (BEN GAY EX) Apply topically. PRN Arthritis pain    . niacin 500 MG tablet Take 500 mg by mouth daily.     . Omega-3 Fatty Acids (FISH OIL) 1360 MG CAPS Take 1 capsule by mouth.    . predniSONE (DELTASONE) 10 MG tablet 4 tabs x 2 days, 2 tabs x 2 days, 1 tab x 2 days 14 tablet 0  . predniSONE (DELTASONE) 5 MG tablet TAKE 1 TABLET BY MOUTH ONCE DAILY 30 tablet 2  . Salicylic Acid 3 % CREA Apply topically. (Psoriasis Control Cream by Lenon Oms)- Active Ingredient Salicylic Acid 3%)    . Tiotropium Bromide Monohydrate (SPIRIVA RESPIMAT) 2.5 MCG/ACT AERS 2  puffs once a day 1 Inhaler 6  . traZODone (DESYREL) 50 MG tablet Take 50 mg by mouth at bedtime. 1/2 to 1 tablet at bedtime for sleep    . verapamil (CALAN-SR) 240 MG CR tablet Take 1 tablet (240 mg total) by mouth at bedtime. 90 tablet 3   No current facility-administered medications on file prior to visit.    No Known Allergies

## 2018-04-13 ENCOUNTER — Inpatient Hospital Stay (HOSPITAL_COMMUNITY)
Admission: EM | Admit: 2018-04-13 | Discharge: 2018-05-01 | DRG: 190 | Disposition: E | Payer: Medicare HMO | Attending: Internal Medicine | Admitting: Internal Medicine

## 2018-04-13 ENCOUNTER — Other Ambulatory Visit: Payer: Self-pay

## 2018-04-13 ENCOUNTER — Emergency Department (HOSPITAL_COMMUNITY): Payer: Medicare HMO

## 2018-04-13 ENCOUNTER — Encounter (HOSPITAL_COMMUNITY): Payer: Self-pay

## 2018-04-13 DIAGNOSIS — R1310 Dysphagia, unspecified: Secondary | ICD-10-CM | POA: Diagnosis present

## 2018-04-13 DIAGNOSIS — Z515 Encounter for palliative care: Secondary | ICD-10-CM | POA: Diagnosis not present

## 2018-04-13 DIAGNOSIS — J9621 Acute and chronic respiratory failure with hypoxia: Secondary | ICD-10-CM | POA: Diagnosis not present

## 2018-04-13 DIAGNOSIS — K219 Gastro-esophageal reflux disease without esophagitis: Secondary | ICD-10-CM | POA: Diagnosis present

## 2018-04-13 DIAGNOSIS — Z01810 Encounter for preprocedural cardiovascular examination: Secondary | ICD-10-CM | POA: Diagnosis not present

## 2018-04-13 DIAGNOSIS — M1990 Unspecified osteoarthritis, unspecified site: Secondary | ICD-10-CM | POA: Diagnosis present

## 2018-04-13 DIAGNOSIS — M069 Rheumatoid arthritis, unspecified: Secondary | ICD-10-CM | POA: Diagnosis present

## 2018-04-13 DIAGNOSIS — Z90710 Acquired absence of both cervix and uterus: Secondary | ICD-10-CM

## 2018-04-13 DIAGNOSIS — N170 Acute kidney failure with tubular necrosis: Secondary | ICD-10-CM | POA: Diagnosis not present

## 2018-04-13 DIAGNOSIS — Z825 Family history of asthma and other chronic lower respiratory diseases: Secondary | ICD-10-CM

## 2018-04-13 DIAGNOSIS — N179 Acute kidney failure, unspecified: Secondary | ICD-10-CM | POA: Diagnosis present

## 2018-04-13 DIAGNOSIS — Z008 Encounter for other general examination: Secondary | ICD-10-CM

## 2018-04-13 DIAGNOSIS — I5032 Chronic diastolic (congestive) heart failure: Secondary | ICD-10-CM | POA: Diagnosis present

## 2018-04-13 DIAGNOSIS — R531 Weakness: Secondary | ICD-10-CM | POA: Diagnosis not present

## 2018-04-13 DIAGNOSIS — M8580 Other specified disorders of bone density and structure, unspecified site: Secondary | ICD-10-CM | POA: Diagnosis present

## 2018-04-13 DIAGNOSIS — I10 Essential (primary) hypertension: Secondary | ICD-10-CM

## 2018-04-13 DIAGNOSIS — Z79899 Other long term (current) drug therapy: Secondary | ICD-10-CM

## 2018-04-13 DIAGNOSIS — D509 Iron deficiency anemia, unspecified: Secondary | ICD-10-CM | POA: Diagnosis present

## 2018-04-13 DIAGNOSIS — N186 End stage renal disease: Secondary | ICD-10-CM | POA: Diagnosis not present

## 2018-04-13 DIAGNOSIS — J439 Emphysema, unspecified: Principal | ICD-10-CM | POA: Diagnosis present

## 2018-04-13 DIAGNOSIS — E162 Hypoglycemia, unspecified: Secondary | ICD-10-CM | POA: Diagnosis present

## 2018-04-13 DIAGNOSIS — Z87442 Personal history of urinary calculi: Secondary | ICD-10-CM | POA: Diagnosis not present

## 2018-04-13 DIAGNOSIS — J449 Chronic obstructive pulmonary disease, unspecified: Secondary | ICD-10-CM | POA: Diagnosis not present

## 2018-04-13 DIAGNOSIS — Z8249 Family history of ischemic heart disease and other diseases of the circulatory system: Secondary | ICD-10-CM

## 2018-04-13 DIAGNOSIS — R0602 Shortness of breath: Secondary | ICD-10-CM | POA: Diagnosis not present

## 2018-04-13 DIAGNOSIS — Z87891 Personal history of nicotine dependence: Secondary | ICD-10-CM | POA: Diagnosis not present

## 2018-04-13 DIAGNOSIS — Z9981 Dependence on supplemental oxygen: Secondary | ICD-10-CM

## 2018-04-13 DIAGNOSIS — I248 Other forms of acute ischemic heart disease: Secondary | ICD-10-CM | POA: Diagnosis present

## 2018-04-13 DIAGNOSIS — M7989 Other specified soft tissue disorders: Secondary | ICD-10-CM | POA: Diagnosis not present

## 2018-04-13 DIAGNOSIS — G4700 Insomnia, unspecified: Secondary | ICD-10-CM | POA: Diagnosis present

## 2018-04-13 DIAGNOSIS — I120 Hypertensive chronic kidney disease with stage 5 chronic kidney disease or end stage renal disease: Secondary | ICD-10-CM | POA: Diagnosis not present

## 2018-04-13 DIAGNOSIS — N185 Chronic kidney disease, stage 5: Secondary | ICD-10-CM

## 2018-04-13 DIAGNOSIS — N189 Chronic kidney disease, unspecified: Secondary | ICD-10-CM

## 2018-04-13 DIAGNOSIS — N184 Chronic kidney disease, stage 4 (severe): Secondary | ICD-10-CM | POA: Diagnosis not present

## 2018-04-13 DIAGNOSIS — R748 Abnormal levels of other serum enzymes: Secondary | ICD-10-CM | POA: Diagnosis not present

## 2018-04-13 DIAGNOSIS — Z66 Do not resuscitate: Secondary | ICD-10-CM | POA: Diagnosis not present

## 2018-04-13 DIAGNOSIS — Z9049 Acquired absence of other specified parts of digestive tract: Secondary | ICD-10-CM | POA: Diagnosis not present

## 2018-04-13 DIAGNOSIS — R9431 Abnormal electrocardiogram [ECG] [EKG]: Secondary | ICD-10-CM | POA: Diagnosis not present

## 2018-04-13 DIAGNOSIS — J441 Chronic obstructive pulmonary disease with (acute) exacerbation: Secondary | ICD-10-CM | POA: Diagnosis not present

## 2018-04-13 DIAGNOSIS — I132 Hypertensive heart and chronic kidney disease with heart failure and with stage 5 chronic kidney disease, or end stage renal disease: Secondary | ICD-10-CM | POA: Diagnosis not present

## 2018-04-13 DIAGNOSIS — Z7952 Long term (current) use of systemic steroids: Secondary | ICD-10-CM

## 2018-04-13 DIAGNOSIS — J849 Interstitial pulmonary disease, unspecified: Secondary | ICD-10-CM | POA: Diagnosis present

## 2018-04-13 DIAGNOSIS — R7989 Other specified abnormal findings of blood chemistry: Secondary | ICD-10-CM

## 2018-04-13 DIAGNOSIS — Z7982 Long term (current) use of aspirin: Secondary | ICD-10-CM

## 2018-04-13 DIAGNOSIS — R627 Adult failure to thrive: Secondary | ICD-10-CM | POA: Diagnosis not present

## 2018-04-13 DIAGNOSIS — E876 Hypokalemia: Secondary | ICD-10-CM | POA: Diagnosis present

## 2018-04-13 DIAGNOSIS — E860 Dehydration: Secondary | ICD-10-CM | POA: Diagnosis present

## 2018-04-13 DIAGNOSIS — N183 Chronic kidney disease, stage 3 (moderate): Secondary | ICD-10-CM

## 2018-04-13 DIAGNOSIS — G629 Polyneuropathy, unspecified: Secondary | ICD-10-CM | POA: Diagnosis present

## 2018-04-13 DIAGNOSIS — Z800 Family history of malignant neoplasm of digestive organs: Secondary | ICD-10-CM

## 2018-04-13 DIAGNOSIS — R778 Other specified abnormalities of plasma proteins: Secondary | ICD-10-CM | POA: Diagnosis present

## 2018-04-13 DIAGNOSIS — L409 Psoriasis, unspecified: Secondary | ICD-10-CM | POA: Diagnosis present

## 2018-04-13 DIAGNOSIS — D631 Anemia in chronic kidney disease: Secondary | ICD-10-CM | POA: Diagnosis not present

## 2018-04-13 HISTORY — DX: Insomnia, unspecified: G47.00

## 2018-04-13 HISTORY — DX: Dysphagia, unspecified: R13.10

## 2018-04-13 HISTORY — DX: Abnormal weight loss: R63.4

## 2018-04-13 HISTORY — DX: Polyneuropathy, unspecified: G62.9

## 2018-04-13 HISTORY — DX: Unspecified osteoarthritis, unspecified site: M19.90

## 2018-04-13 HISTORY — DX: Acute pancreatitis without necrosis or infection, unspecified: K85.90

## 2018-04-13 HISTORY — DX: Disorder of kidney and ureter, unspecified: N28.9

## 2018-04-13 HISTORY — DX: Calculus of kidney: N20.0

## 2018-04-13 HISTORY — DX: Gastro-esophageal reflux disease without esophagitis: K21.9

## 2018-04-13 LAB — BASIC METABOLIC PANEL
Anion gap: 11 (ref 5–15)
BUN: 30 mg/dL — ABNORMAL HIGH (ref 8–23)
CO2: 26 mmol/L (ref 22–32)
Calcium: 8.4 mg/dL — ABNORMAL LOW (ref 8.9–10.3)
Chloride: 106 mmol/L (ref 98–111)
Creatinine, Ser: 4.01 mg/dL — ABNORMAL HIGH (ref 0.44–1.00)
GFR calc Af Amer: 11 mL/min — ABNORMAL LOW (ref >60)
GFR calc non Af Amer: 10 mL/min — ABNORMAL LOW (ref >60)
GLUCOSE: 75 mg/dL (ref 70–99)
Potassium: 3.1 mmol/L — ABNORMAL LOW (ref 3.5–5.1)
Sodium: 143 mmol/L (ref 135–145)

## 2018-04-13 LAB — APTT: aPTT: 37 seconds — ABNORMAL HIGH (ref 24–36)

## 2018-04-13 LAB — CBC
HEMATOCRIT: 36.2 % (ref 36.0–46.0)
HEMOGLOBIN: 10.9 g/dL — AB (ref 12.0–15.0)
MCH: 25.3 pg — AB (ref 26.0–34.0)
MCHC: 30.1 g/dL (ref 30.0–36.0)
MCV: 84 fL (ref 78.0–100.0)
Platelets: 190 10*3/uL (ref 150–400)
RBC: 4.31 MIL/uL (ref 3.87–5.11)
RDW: 15.1 % (ref 11.5–15.5)
WBC: 4.5 10*3/uL (ref 4.0–10.5)

## 2018-04-13 LAB — URINALYSIS, ROUTINE W REFLEX MICROSCOPIC
BACTERIA UA: NONE SEEN
Bilirubin Urine: NEGATIVE
Glucose, UA: NEGATIVE mg/dL
HGB URINE DIPSTICK: NEGATIVE
Ketones, ur: 5 mg/dL — AB
Leukocytes, UA: NEGATIVE
Nitrite: NEGATIVE
Specific Gravity, Urine: 1.022 (ref 1.005–1.030)
pH: 6 (ref 5.0–8.0)

## 2018-04-13 LAB — PROTIME-INR
INR: 1.04
PROTHROMBIN TIME: 13.5 s (ref 11.4–15.2)

## 2018-04-13 LAB — CBG MONITORING, ED: GLUCOSE-CAPILLARY: 61 mg/dL — AB (ref 70–99)

## 2018-04-13 LAB — TROPONIN I: Troponin I: 0.04 ng/mL (ref <0.03)

## 2018-04-13 MED ORDER — SODIUM CHLORIDE 0.9 % IV SOLN
1.0000 g | Freq: Every day | INTRAVENOUS | Status: DC
  Administered 2018-04-14 (×2): 1 g via INTRAVENOUS
  Filled 2018-04-13 (×2): qty 1

## 2018-04-13 MED ORDER — VERAPAMIL HCL ER 240 MG PO TBCR
240.0000 mg | EXTENDED_RELEASE_TABLET | Freq: Every day | ORAL | Status: DC
  Administered 2018-04-14 – 2018-04-17 (×5): 240 mg via ORAL
  Filled 2018-04-13 (×14): qty 1

## 2018-04-13 MED ORDER — POTASSIUM CHLORIDE CRYS ER 20 MEQ PO TBCR
40.0000 meq | EXTENDED_RELEASE_TABLET | ORAL | Status: AC
  Administered 2018-04-14: 40 meq via ORAL
  Filled 2018-04-13: qty 2

## 2018-04-13 MED ORDER — LEFLUNOMIDE 20 MG PO TABS
10.0000 mg | ORAL_TABLET | Freq: Every day | ORAL | Status: DC
  Administered 2018-04-14 – 2018-04-18 (×5): 10 mg via ORAL
  Filled 2018-04-13 (×12): qty 0.5

## 2018-04-13 MED ORDER — ACETAMINOPHEN 325 MG PO TABS
650.0000 mg | ORAL_TABLET | Freq: Four times a day (QID) | ORAL | Status: DC | PRN
  Administered 2018-04-14 – 2018-04-15 (×2): 650 mg via ORAL
  Filled 2018-04-13 (×2): qty 2

## 2018-04-13 MED ORDER — HEPARIN BOLUS VIA INFUSION
5000.0000 [IU] | Freq: Once | INTRAVENOUS | Status: AC
  Administered 2018-04-13: 5000 [IU] via INTRAVENOUS
  Filled 2018-04-13: qty 5000

## 2018-04-13 MED ORDER — ARFORMOTEROL TARTRATE 15 MCG/2ML IN NEBU
15.0000 ug | INHALATION_SOLUTION | Freq: Two times a day (BID) | RESPIRATORY_TRACT | Status: DC
  Administered 2018-04-14 – 2018-04-25 (×13): 15 ug via RESPIRATORY_TRACT
  Filled 2018-04-13 (×21): qty 2

## 2018-04-13 MED ORDER — GUAIFENESIN ER 600 MG PO TB12
600.0000 mg | ORAL_TABLET | Freq: Two times a day (BID) | ORAL | Status: DC
  Administered 2018-04-14 – 2018-04-18 (×9): 600 mg via ORAL
  Filled 2018-04-13 (×12): qty 1

## 2018-04-13 MED ORDER — SODIUM CHLORIDE 0.9 % IV SOLN
INTRAVENOUS | Status: DC
  Administered 2018-04-14 (×2): via INTRAVENOUS

## 2018-04-13 MED ORDER — ONDANSETRON HCL 4 MG/2ML IJ SOLN: 4.0000 mg | Freq: Four times a day (QID) | INTRAMUSCULAR | Status: DC | PRN

## 2018-04-13 MED ORDER — ACETAMINOPHEN 650 MG RE SUPP: 650.0000 mg | Freq: Four times a day (QID) | RECTAL | Status: DC | PRN

## 2018-04-13 MED ORDER — BUDESONIDE 0.5 MG/2ML IN SUSP
0.5000 mg | Freq: Two times a day (BID) | RESPIRATORY_TRACT | Status: DC
  Administered 2018-04-14 – 2018-04-25 (×14): 0.5 mg via RESPIRATORY_TRACT
  Filled 2018-04-13 (×19): qty 2

## 2018-04-13 MED ORDER — IPRATROPIUM-ALBUTEROL 0.5-2.5 (3) MG/3ML IN SOLN: 3.0000 mL | RESPIRATORY_TRACT | Status: DC | PRN

## 2018-04-13 MED ORDER — METHYLPREDNISOLONE SODIUM SUCC 125 MG IJ SOLR
60.0000 mg | Freq: Three times a day (TID) | INTRAMUSCULAR | Status: DC
  Administered 2018-04-14 (×2): 60 mg via INTRAVENOUS
  Filled 2018-04-13 (×2): qty 2

## 2018-04-13 MED ORDER — HEPARIN (PORCINE) IN NACL 100-0.45 UNIT/ML-% IJ SOLN
1300.0000 [IU]/h | INTRAMUSCULAR | Status: DC
  Administered 2018-04-13: 1300 [IU]/h via INTRAVENOUS
  Filled 2018-04-13 (×2): qty 250

## 2018-04-13 MED ORDER — SODIUM CHLORIDE 0.9 % IV SOLN: INTRAVENOUS | Status: DC

## 2018-04-13 MED ORDER — IPRATROPIUM-ALBUTEROL 0.5-2.5 (3) MG/3ML IN SOLN
3.0000 mL | Freq: Four times a day (QID) | RESPIRATORY_TRACT | Status: DC
  Administered 2018-04-13: 3 mL via RESPIRATORY_TRACT
  Filled 2018-04-13: qty 3

## 2018-04-13 MED ORDER — SODIUM CHLORIDE 0.9 % IV BOLUS
500.0000 mL | INTRAVENOUS | Status: AC
  Administered 2018-04-13: 500 mL via INTRAVENOUS

## 2018-04-13 MED ORDER — ONDANSETRON HCL 4 MG PO TABS: 4.0000 mg | ORAL_TABLET | Freq: Four times a day (QID) | ORAL | Status: DC | PRN

## 2018-04-13 NOTE — ED Notes (Signed)
Pt aware of urine sample needed.

## 2018-04-13 NOTE — Progress Notes (Signed)
ANTICOAGULATION CONSULT NOTE - Initial Consult  Pharmacy Consult for Heparin Indication: pulmonary embolus  No Known Allergies  Patient Measurements: Height: 5\' 7"  (170.2 cm) Weight: 163 lb (73.9 kg) IBW/kg (Calculated) : 61.6  Vital Signs: Temp: 98.2 F (36.8 C) (09/13 1709) Temp Source: Oral (09/13 1709) BP: 179/85 (09/13 1925) Pulse Rate: 81 (09/13 1925)  Labs: Recent Labs    2018/05/03 1828  HGB 10.9*  HCT 36.2  PLT 190  CREATININE 4.01*  TROPONINI 0.04*    Estimated Creatinine Clearance: 10.9 mL/min (A) (by C-G formula based on SCr of 4.01 mg/dL (H)).   Medical History: Past Medical History:  Diagnosis Date  . Arthritis   . COPD (chronic obstructive pulmonary disease) (HCC)   . Dysphagia   . Emphysema   . GERD (gastroesophageal reflux disease)   . Hypertension   . Insomnia   . Interstitial lung disease (HCC)   . Kidney stone   . Neuropathy   . On home O2    2L N/C  . Osteopenia   . Pancreatitis   . Psoriasis   . Psoriasis   . Pthirus inguinalis   . Renal disorder   . Rheumatoid arthritis(714.0)   . Vertigo   . Weight loss     Medications: No anticoagulants PTA Infusions:   Assessment: 83 yo F with worsening SOB despite 4 days of antibiotics.  New EKG changes & normal troponin.  No hx VTE, but concerned for PE.  Pharmacy has been consulted to start IV heparin.   CBC reviewed- Hg low at 10.9 (patient's baseline) Pltc WNL AKI (baseline Scr ~1.4)   No bleeding noted.   Goal of Therapy:  Heparin level 0.3-0.7 units/ml Monitor platelets by anticoagulation protocol: Yes   Plan:  Check baseline aPTT, PT/INR Give 5000 units bolus x 1 Start heparin infusion at 1300 units/hr Check anti-Xa level in 8 hours and daily while on heparin Continue to monitor H&H and platelets  F/U plan to confirm PE  96 2018/05/03,8:37 PM

## 2018-04-13 NOTE — ED Notes (Signed)
CRITICAL VALUE ALERT  Critical Value:  Trop 0.04  Date & Time Notied:  9/13, 2008  Provider Notified: Adela Lank, MD

## 2018-04-13 NOTE — ED Notes (Signed)
Patient transported to X-ray, unable to obtain vitals, labs and CBG

## 2018-04-13 NOTE — Progress Notes (Signed)
Pharmacy Antibiotic Note  [name] is a 83 y.o. female admitted on 04/11/2018 with COPD exacerbation inpatient on immunosuppressants.  Pharmacy has been consulted for Cefepime dosing.  Plan: Cefepime 1gm iv q24hr  Height: 5\' 7"  (170.2 cm) Weight: 161 lb 9.6 oz (73.3 kg) IBW/kg (Calculated) : 61.6  Temp (24hrs), Avg:98.1 F (36.7 C), Min:97.9 F (36.6 C), Max:98.2 F (36.8 C)  Recent Labs  Lab 04/05/2018 1828  WBC 4.5  CREATININE 4.01*    Estimated Creatinine Clearance: 10.9 mL/min (A) (by C-G formula based on SCr of 4.01 mg/dL (H)).    No Known Allergies  Antimicrobials this admission: Cefepime 04/09/2018 >>   Dose adjustments this admission:   Microbiology results:   Thank you for allowing pharmacy to be a part of this patient's care.  04/15/2018 [place] 04/02/2018 11:58 PM

## 2018-04-13 NOTE — ED Notes (Signed)
Vein collapsed before able to get iStats

## 2018-04-13 NOTE — ED Provider Notes (Signed)
[facility]-EMERGENCY DEPT Provider Note   CSN: [id number] Arrival date & time: 04/01/2018  1647     History   Chief Complaint Chief Complaint  Patient presents with  . Shortness of Breath  . Weakness    HPI [name] is a 83 y.o. female.  83 yo F with a cc of sob.  Going on for the past couple weeks.  Called her pulmonologist earlier this week who called in levofloxacin for her.  She is taking 4 doses of this but does not feel any better.  She has been having increased cough change in sputum and increased sputum.  She feels short of breath at all times worse upon exertion.  She is normally on 2 L of oxygen at all times and she is turned up to 4 but still feels short of breath.  She has had some episodic chest pain that she describes as a discomfort that is to the center of her chest that seems to come and go at different moments.  Denies any worsening with exertion.  Denies diaphoresis or vomiting.  Denies history of PE or DVT.  Denies unilateral lower extremity edema.  Denies worsening edema.  The history is provided by the patient.  Shortness of Breath  This is a new problem. The average episode lasts 2 weeks. The problem occurs continuously.The current episode started 2 days ago. The problem has not changed since onset.Pertinent negatives include no fever, no headaches, no rhinorrhea, no wheezing, no chest pain and no vomiting. She has tried nothing for the symptoms. The treatment provided no relief. She has had no prior hospitalizations. She has had no prior ED visits.  Weakness  Primary symptoms include no dizziness. Pertinent negatives include no shortness of breath, no chest pain, no vomiting and no headaches.    Past Medical History:  Diagnosis Date  . Arthritis   . COPD (chronic obstructive pulmonary disease) (HCC)   . Dysphagia   . Emphysema   . GERD (gastroesophageal reflux disease)   . Hypertension   . Insomnia   . Interstitial lung disease  (HCC)   . Kidney stone   . Neuropathy   . On home O2    2L N/C  . Osteopenia   . Pancreatitis   . Psoriasis   . Psoriasis   . Pthirus inguinalis   . Renal disorder   . Rheumatoid arthritis(714.0)   . Vertigo   . Weight loss     Patient Active Problem List   Diagnosis Date Noted  . Rheumatoid arthritis (HCC) 08/04/2016  . Microcytic anemia 08/04/2016  . CKD (chronic kidney disease), stage III (HCC) 08/04/2016  . Acute on chronic respiratory failure with hypoxia (HCC) 08/04/2016  . Multifocal atrial tachycardia (HCC) 03/29/2016  . COPD (chronic obstructive pulmonary disease) (HCC) 01/16/2008  . OBESITY HYPOVENTILATION SYNDROME 06/14/2007  . PULMONARY FIBROSIS 06/14/2007  . RESPIRATORY FAILURE, CHRONIC 06/14/2007  . Essential hypertension 02/26/2007  . OSTEOPENIA 02/26/2007    Past Surgical History:  Procedure Laterality Date  . CHOLECYSTECTOMY    . VESICOVAGINAL FISTULA CLOSURE W/ TAH  1962     OB History   None      Home Medications    Prior to Admission medications   Medication Sig Start Date End Date Taking? Authorizing Provider  Adalimumab (HUMIRA) 40 MG/0.8ML PSKT Inject 40 mg into the skin every 14 (fourteen) days.    [provider]  albuterol (PROVENTIL HFA;VENTOLIN HFA) 108 (90 Base) MCG/ACT inhaler Inhale  2 puffs into the lungs every 6 (six) hours as needed for wheezing or shortness of breath. 04/21/16   Oretha Milch, MD  aspirin 81 MG tablet Take 81 mg by mouth daily.     [provider]  azithromycin (ZITHROMAX) 250 MG tablet Take 2 pills today then one a day for 4 additional days 01/25/18   Leslye Peer, MD  budesonide-formoterol [facility]) 160-4.5 MCG/ACT inhaler Inhale 2 puffs into the lungs 2 (two) times daily. 09/16/16   Parrett, Virgel Bouquet, NP  ferrous sulfate 325 (65 FE) MG tablet Take 1 tablet by mouth 2 (two) times daily. 09/19/16   [provider]  folic acid (FOLVITE) 800 MCG tablet Take 800 mcg by mouth daily.      [provider]  furosemide (LASIX) 40 MG tablet Take 1 tablet by mouth as needed for fluid (1/2 tablet as needed, typically twice a week).  05/30/16   [provider]  Glycopyrrolate-Formoterol (BEVESPI AEROSPHERE) 9-4.8 MCG/ACT AERO Inhale 2 puffs into the lungs 2 (two) times daily. 10/03/17   Leslye Peer, MD  levofloxacin (LEVAQUIN) 500 MG tablet Take 1 tablet (500 mg total) by mouth daily. 04/10/18   Leslye Peer, MD  Liniments (BEN GAY EX) Apply topically. PRN Arthritis pain    [provider]  niacin 500 MG tablet Take 500 mg by mouth daily.     [provider]  Omega-3 Fatty Acids (FISH OIL) 1360 MG CAPS Take 1 capsule by mouth.    [provider]  predniSONE (DELTASONE) 10 MG tablet 4 tabs x 2 days, 2 tabs x 2 days, 1 tab x 2 days 02/03/17   Nyoka Cowden, MD  predniSONE (DELTASONE) 5 MG tablet TAKE 1 TABLET BY MOUTH ONCE DAILY 12/20/17   Leslye Peer, MD  Salicylic Acid 3 % CREA Apply topically. (Psoriasis Control Cream by Lenon Oms)- Active Ingredient Salicylic Acid 3%)    [provider]  Tiotropium Bromide Monohydrate (SPIRIVA RESPIMAT) 2.5 MCG/ACT AERS 2 puffs once a day 11/17/16 01/24/18  Leslye Peer, MD  traZODone (DESYREL) 50 MG tablet Take 50 mg by mouth at bedtime. 1/2 to 1 tablet at bedtime for sleep    Rankins, Fanny Dance, MD  verapamil (CALAN-SR) 240 MG CR tablet Take 1 tablet (240 mg total) by mouth at bedtime. 08/21/17   Wendall Stade, MD    Family History Family History  Problem Relation Age of Onset  . Heart disease Sister   . Colon cancer Sister   . Asthma Sister     Social History Social History   Tobacco Use  . Smoking status: Former Smoker    Packs/day: 0.50    Years: 50.00    Pack years: 25.00    Types: Cigarettes    Last attempt to quit: 08/01/2000    Years since quitting: 17.7  . Smokeless tobacco: Never Used  Substance Use Topics  . Alcohol use: No  . Drug use: No     Allergies     Patient has no known allergies.   Review of Systems Review of Systems  Constitutional: Negative for chills and fever.  HENT: Negative for congestion and rhinorrhea.   Eyes: Negative for redness and visual disturbance.  Respiratory: Negative for shortness of breath and wheezing.   Cardiovascular: Negative for chest pain and palpitations.  Gastrointestinal: Negative for nausea and vomiting.  Genitourinary: Negative for dysuria and urgency.  Musculoskeletal: Negative for arthralgias and myalgias.  Skin: Negative for  pallor and wound.  Neurological: Positive for weakness. Negative for dizziness and headaches.     Physical Exam Updated Vital Signs BP (!) 179/85   Pulse 81   Temp 98.2 F (36.8 C) (Oral)   Resp 14   Ht 5\' 7"  (1.702 m)   Wt 73.9 kg   SpO2 100%   BMI 25.53 kg/m   Physical Exam  Constitutional: She is oriented to person, place, and time. She appears well-developed and well-nourished. No distress.  HENT:  Head: Normocephalic and atraumatic.  Eyes: Pupils are equal, round, and reactive to light. EOM are normal.  Neck: Normal range of motion. Neck supple.  Cardiovascular: Normal rate and regular rhythm. Exam reveals no gallop and no friction rub.  No murmur heard. Pulmonary/Chest: Effort normal. She has no wheezes. She has no rales.  Abdominal: Soft. She exhibits no distension. There is no tenderness.  Musculoskeletal: She exhibits edema (1+ bilaterally). She exhibits no tenderness.  Neurological: She is alert and oriented to person, place, and time.  Skin: Skin is warm and dry. She is not diaphoretic.  Psychiatric: She has a normal mood and affect. Her behavior is normal.  Nursing note and vitals reviewed.    ED Treatments / Results  Labs (all labs ordered are listed, but only abnormal results are displayed) Labs Reviewed  BASIC METABOLIC PANEL - Abnormal; Notable for the following components:      Result Value   Potassium 3.1 (*)    BUN 30 (*)     Creatinine, Ser 4.01 (*)    Calcium 8.4 (*)    GFR calc non Af Amer 10 (*)    GFR calc Af Amer 11 (*)    All other components within normal limits  CBC - Abnormal; Notable for the following components:   Hemoglobin 10.9 (*)    MCH 25.3 (*)    All other components within normal limits  TROPONIN I - Abnormal; Notable for the following components:   Troponin I 0.04 (*)    All other components within normal limits  CBG MONITORING, ED - Abnormal; Notable for the following components:   Glucose-Capillary 61 (*)    All other components within normal limits  URINALYSIS, ROUTINE W REFLEX MICROSCOPIC  APTT  PROTIME-INR  CBC    EKG EKG Interpretation  Date/Time:  Friday 04/28/18 17:06:02 EDT Ventricular Rate:  85 PR Interval:    QRS Duration: 133 QT Interval:  490 QTC Calculation: 583 R Axis:   122 Text Interpretation:  Sinus rhythm Right bundle branch block st depression in II, III, avF and seen in v2,3 not seen on prior Confirmed by [id number] [phone]) on 04/28/18 5:57:16 PM   Radiology Dg Chest 2 View  Result Date: 04-28-2018 CLINICAL DATA:  Weakness and increased shortness of breath 3 days. EXAM: CHEST - 2 VIEW COMPARISON:  08/04/2016 FINDINGS: Lungs are adequately inflated without focal airspace consolidation or effusion. There is subtle increased bilateral interstitial markings without change. Mild stable cardiomegaly. Remainder of the exam is unchanged. IMPRESSION: No acute cardiopulmonary disease. Chronic interstitial disease. Stable cardiomegaly. Electronically Signed   By: 10/02/2016 M.D.   On: 2018-04-28 18:14    Procedures Procedures (including critical care time)  Medications Ordered in ED Medications  heparin bolus via infusion 5,000 Units (has no administration in time range)    Followed by  heparin ADULT infusion 100 units/mL (25000 units/232mL sodium chloride 0.45%) (has no administration in time range)     Initial Impression /  Assessment and Plan  / ED Course  I have reviewed the triage vital signs and the nursing notes.  Pertinent labs & imaging results that were available during my care of the patient were reviewed by me and considered in my medical decision making (see chart for details).     83 yo F with a chief complaint of shortness of breath.  Going on for the past week and a half or so.  She is been on antibiotics but without improvement.  Normally on 2 L oxygen she started up to 4, was at 89% rest on arrival.  Chest x-ray reviewed by me without significant change.  Patient's EKG has new RBBB with ST depressions that maybe due to the block.  Clear lung sounds on my exam.  BMP with AKI baseline creatinine of 1.4 now 4.   The patient has a troponin is positive at 0.4.  I am concerned with clear breath sounds and new EKG changes that the patient's may have a pulmonary embolism.  Hx not consistent with acs. We will start her on heparin.  I am unable to obtain a CT scan due to her creatinine.  Will discuss with the hospitalist.  CRITICAL CARE Performed by: Rae Roam   Total critical care time: 80 minutes  Critical care time was exclusive of separately billable procedures and treating other patients.  Critical care was necessary to treat or prevent imminent or life-threatening deterioration.  Critical care was time spent personally by me on the following activities: development of treatment plan with patient and/or surrogate as well as nursing, discussions with consultants, evaluation of patient's response to treatment, examination of patient, obtaining history from patient or surrogate, ordering and performing treatments and interventions, ordering and review of laboratory studies, ordering and review of radiographic studies, pulse oximetry and re-evaluation of patient's condition.  The patients results and plan were reviewed and discussed.   Any x-rays performed were independently reviewed by myself.   Differential  diagnosis were considered with the presenting HPI.  Medications  heparin bolus via infusion 5,000 Units (has no administration in time range)    Followed by  heparin ADULT infusion 100 units/mL (25000 units/246mL sodium chloride 0.45%) (has no administration in time range)    Vitals:   04/12/2018 1709 04/03/2018 1716 04/12/2018 1925  BP: (!) 161/88  (!) 179/85  Pulse: 86  81  Resp: 20  14  Temp: 98.2 F (36.8 C)    TempSrc: Oral    SpO2: (!) 89%  100%  Weight:  73.9 kg   Height:  5\' 7"  (1.702 m)     Final diagnoses:  SOB (shortness of breath) on exertion    Admission/ observation were discussed with the admitting physician, patient and/or family and they are comfortable with the plan.   Final Clinical Impressions(s) / ED Diagnoses   Final diagnoses:  SOB (shortness of breath) on exertion    ED Discharge Orders    None       , DO 04/08/2018 2110

## 2018-04-13 NOTE — ED Notes (Addendum)
Pt refusing more blood and IV, MD made aware. Pt agitated and not cooperating, minimal assessment able to be performed.

## 2018-04-13 NOTE — ED Notes (Signed)
ED TO INPATIENT HANDOFF REPORT  Name/Age/Gender [name] 83 y.o. female  Code Status Code Status History    Date Active Date Inactive Code Status Order ID Comments User Context   08/04/2016 0420 08/05/2016 1301 DNR [id number]  Edwin Dada, MD Inpatient    Questions for Most Recent Historical Code Status (Order [id number])    Question Answer Comment   In the event of cardiac or respiratory ARREST Do not call a "code blue"    In the event of cardiac or respiratory ARREST Do not perform Intubation, CPR, defibrillation or ACLS    In the event of cardiac or respiratory ARREST Use medication by any route, position, wound care, and other measures to relive pain and suffering. May use oxygen, suction and manual treatment of airway obstruction as needed for comfort.         Advance Directive Documentation     Most Recent Value  Type of Advance Directive  Living will, Out of facility DNR (pink MOST or yellow form)  Pre-existing out of facility DNR order (yellow form or pink MOST form)  -  "MOST" Form in Place?  -      Home/SNF/Other Home  Chief Complaint Shortness of Breath/Weakness   Level of Care/Admitting Diagnosis ED Disposition    ED Disposition Condition [place]: [name] [[id number]]  Level of Care: Telemetry [5]  Admit to tele based on following criteria: Complex arrhythmia (Bradycardia/Tachycardia)  Diagnosis: COPD exacerbation [facility]) [[id number]]  Admitting Physician: Norval [place] [[id number]]  Attending Physician: Norval [place] [[id number]]  Estimated length of stay: 3 - 4 days  Certification:: I certify this patient will need inpatient services for at least 2 midnights  PT Class (Do Not Modify): Inpatient [101]  PT Acc Code (Do Not Modify): Private [1]       Medical History Past Medical History:  Diagnosis Date  . Arthritis   . COPD (chronic obstructive pulmonary disease) ([place])   . Dysphagia   . Emphysema    . GERD (gastroesophageal reflux disease)   . Hypertension   . Insomnia   . Interstitial lung disease ([place])   . Kidney stone   . Neuropathy   . On home O2    2L N/C  . Osteopenia   . Pancreatitis   . Psoriasis   . Psoriasis   . Pthirus inguinalis   . Renal disorder   . Rheumatoid arthritis(714.0)   . Vertigo   . Weight loss     Allergies No Known Allergies  IV Location/Drains/Wounds Patient Lines/Drains/Airways Status   Active Line/Drains/Airways    Name:   Placement date:   Placement time:   Site:   Days:   Peripheral IV 04/21/2018 Right Antecubital   04/04/2018    2135    Antecubital   less than 1   Peripheral IV 04/20/2018 Left Antecubital   04/04/2018    2136    Antecubital   less than 1          Labs/Imaging Results for orders placed or performed during the hospital encounter of 04/18/2018 (from the past 48 hour(s))  Basic metabolic panel     Status: Abnormal   Collection Time: 04/28/2018  6:28 PM  Result Value Ref Range   Sodium 143 135 - 145 mmol/L   Potassium 3.1 (L) 3.5 - 5.1 mmol/L   Chloride 106 98 - 111 mmol/L   CO2 26 22 - 32 mmol/L   Glucose, Bld  75 70 - 99 mg/dL   BUN 30 (H) 8 - 23 mg/dL   Creatinine, Ser 4.01 (H) 0.44 - 1.00 mg/dL   Calcium 8.4 (L) 8.9 - 10.3 mg/dL   GFR calc non Af Amer 10 (L) >60 mL/min   GFR calc Af Amer 11 (L) >60 mL/min    Comment: (NOTE) The eGFR has been calculated using the CKD EPI equation. This calculation has not been validated in all clinical situations. eGFR's persistently <60 mL/min signify possible Chronic Kidney Disease.    Anion gap 11 5 - 15    Comment: Performed at [facility], [place] [address] [place], [place] [address]  CBC     Status: Abnormal   Collection Time: 04/09/2018  6:28 PM  Result Value Ref Range   WBC 4.5 4.0 - 10.5 K/uL   RBC 4.31 3.87 - 5.11 MIL/uL   Hemoglobin 10.9 (L) 12.0 - 15.0 g/dL   HCT 36.2 36.0 - 46.0 %   MCV 84.0 78.0 - 100.0 fL   MCH 25.3 (L) 26.0 - 34.0 pg   MCHC 30.1  30.0 - 36.0 g/dL   RDW 15.1 11.5 - 15.5 %   Platelets 190 150 - 400 K/uL    Comment: Performed at [facility], [place] [address], [place], [place] [address]  Troponin I     Status: Abnormal   Collection Time: 04/29/2018  6:28 PM  Result Value Ref Range   Troponin I 0.04 (HH) <0.03 ng/mL    Comment: CRITICAL RESULT CALLED TO, READ BACK BY AND VERIFIED WITH: S.Elorah Dewing AT 2008 ON 04/06/2018 BY N.THOMPSON Performed at [facility], [place] [address], [place], [place] [address]   CBG monitoring, ED     Status: Abnormal   Collection Time: 04/15/2018  6:33 PM  Result Value Ref Range   Glucose-Capillary 61 (L) 70 - 99 mg/dL  APTT     Status: Abnormal   Collection Time: 04/23/2018  9:31 PM  Result Value Ref Range   aPTT 37 (H) 24 - 36 seconds    Comment:        IF BASELINE aPTT IS ELEVATED, SUGGEST PATIENT RISK ASSESSMENT BE USED TO DETERMINE APPROPRIATE ANTICOAGULANT THERAPY. Performed at [facility], [place] [address], [place], [place] [address]   Protime-INR     Status: None   Collection Time: 04/03/2018  9:31 PM  Result Value Ref Range   Prothrombin Time 13.5 11.4 - 15.2 seconds   INR 1.04     Comment: Performed at [facility], [place] [address] [place], [place] [address]   Dg Chest 2 View  Result Date: 04/01/2018 CLINICAL DATA:  Weakness and increased shortness of breath 3 days. EXAM: CHEST - 2 VIEW COMPARISON:  08/04/2016 FINDINGS: Lungs are adequately inflated without focal airspace consolidation or effusion. There is subtle increased bilateral interstitial markings without change. Mild stable cardiomegaly. Remainder of the exam is unchanged. IMPRESSION: No acute cardiopulmonary disease. Chronic interstitial disease. Stable cardiomegaly. Electronically Signed   By: Marin Olp M.D.   On: 04/06/2018 18:14    Pending Labs Unresulted Labs (From admission, onward)    Start     Ordered   04/14/18 0500  CBC  Daily,   R      04/21/2018 2046   04/08/2018 1724  Urinalysis, Routine w reflex microscopic  Once,   STAT     04/17/2018 1724          Vitals/Pain Today's Vitals  04/10/2018 1709 04/11/2018 1716 04/02/2018 1925 04/20/2018 2128  BP: (!) 161/88  (!) 179/85 (!) 177/100  Pulse: 86  81 89  Resp: 20  14 20   Temp: 98.2 F (36.8 C)     TempSrc: Oral     SpO2: (!) 89%  100% 96%  Weight:  73.9 kg    Height:  5' 7"  (1.702 m)    PainSc:  8       Isolation Precautions No active isolations  Medications Medications  heparin bolus via infusion 5,000 Units (has no administration in time range)    Followed by  heparin ADULT infusion 100 units/mL (25000 units/250m sodium chloride 0.45%) (has no administration in time range)    Mobility walks with device

## 2018-04-13 NOTE — ED Triage Notes (Signed)
Patient c/o weakness and increased SOB x 3 days. Patient states she is normally on O2 2L/min and came to triage with O2 4L/min via [place] and sats were 89%. Patient also c/o increased weakness,

## 2018-04-13 NOTE — H&P (Addendum)
History and Physical    [name] ERD:[id number] DOB: [birth year] DOA: 04/20/2018  Referring MD/NP/PA: Melene Plan, MD PCP: Clayborn Heron, MD  Patient coming from: Home via EMS  Chief Complaint: Shortness of breath  I have personally briefly reviewed patient's old medical records in [facility]   HPI: [name] is a 83 y.o. female with medical history significant of HTN, COPD, oxygen dependent on 2 L of nasal cannula oxygen, RA on Humira, psoriasis, GERD; who presents with complaints of worsening shortness of breath over the last 3 to 4 days.   Patient reports having a productive cough with increased greenish/brown colored sputum production.  With the symptoms she also noted left sided chest pains that come and go lasting only a few seconds.    Associated symptoms include decreased appetite, weight loss, joint pains most notable of right shoulder, generalized weakness, and chronic lower extremity leg swelling and pain worse on the left leg.  Of note patient's husband recently died 1 month ago.  She has had to increase oxygen settings from 2L to 4Ldue to symptoms, but still feels short of breath.  Any exertion worsens shortness of breath. Despite poor appetite patient reports still taking all her medications including Lasix as prescribed. At baseline patient lives alone and reports that she has not been ambulatory much.  Patient called into Dr. Delton Coombes her pulmonologist office 3 days ago, and was sent in a prescription of Levaquin which she is been taking.  Despite taking antibiotics denies any improvement in symptoms.  Patient reports last being on steroids approximately 3 months ago.  In route with EMS patient was noted to be on 4 L with O2 sats as low as 89%.    ED Course: Upon admission into the emergency department patient was noted to be afebrile, blood pressure 161/88-170 9/85, O2 saturations 89 to 100% on 4 L nasal cannula oxygen.  Labs revealed WBC 4.5, hemoglobin 10.9,  potassium 3.1, BUN 30, creatinine 4.01, and troponin 0.04.  Chest x-ray showing chronic interstitial disease with stable cardiomegaly.  Due to patient's symptoms and concern for possibility of a pulmonary embolus patient was started on heparin drip.  Patient currently reports being chest pain-free.  Review of Systems  Constitutional: Positive for weight loss. Negative for fever.  HENT: Positive for hearing loss.   Eyes: Negative for photophobia and pain.  Respiratory: Positive for cough, sputum production and shortness of breath. Negative for hemoptysis.   Cardiovascular: Positive for chest pain and leg swelling.  Gastrointestinal: Negative for abdominal pain, nausea and vomiting.  Genitourinary: Negative for dysuria and hematuria.  Musculoskeletal: Positive for joint pain and myalgias. Negative for falls.  Skin: Negative for itching and rash.  Neurological: Negative for focal weakness and loss of consciousness.  Psychiatric/Behavioral: Negative for memory loss and substance abuse.    Past Medical History:  Diagnosis Date  . Arthritis   . COPD (chronic obstructive pulmonary disease) (H[facility])   . Dysphagia   . Emphysema   . GERD (gastroesophageal reflux disease)   . Hypertension   . Insomnia   . Interstitial lung disease ([facility])   . Kidney stone   . Neuropathy   . On home O2    2L N/C  . Osteopenia   . Pancreatitis   . Psoriasis   . Psoriasis   . Pthirus inguinalis   . Renal disorder   . Rheumatoid arthritis(714.0)   . Vertigo   . Weight loss     Past  Surgical History:  Procedure Laterality Date  . CHOLECYSTECTOMY    . VESICOVAGINAL FISTULA CLOSURE W/ TAH  1962     reports that she quit smoking about 17 years ago. Her smoking use included cigarettes. She has a 25.00 pack-year smoking history. She has never used smokeless tobacco. She reports that she does not drink alcohol or use drugs.  No Known Allergies  Family History  Problem Relation Age of Onset  . Heart disease  Sister   . Colon cancer Sister   . Asthma Sister     Prior to Admission medications   Medication Sig Start Date End Date Taking? Authorizing Provider  Adalimumab (HUMIRA) 40 MG/0.8ML PSKT Inject 40 mg into the skin every 14 (fourteen) days.    [provider]  albuterol (PROVENTIL HFA;VENTOLIN HFA) 108 (90 Base) MCG/ACT inhaler Inhale 2 puffs into the lungs every 6 (six) hours as needed for wheezing or shortness of breath. 04/21/16   Oretha Milch, MD  aspirin 81 MG tablet Take 81 mg by mouth daily.     [provider]  azithromycin (ZITHROMAX) 250 MG tablet Take 2 pills today then one a day for 4 additional days 01/25/18   Leslye Peer, MD  budesonide-formoterol [facility]) 160-4.5 MCG/ACT inhaler Inhale 2 puffs into the lungs 2 (two) times daily. 09/16/16   Parrett, Virgel Bouquet, NP  ferrous sulfate 325 (65 FE) MG tablet Take 1 tablet by mouth 2 (two) times daily. 09/19/16   [provider]  folic acid (FOLVITE) 800 MCG tablet Take 800 mcg by mouth daily.     [provider]  furosemide (LASIX) 40 MG tablet Take 1 tablet by mouth as needed for fluid (1/2 tablet as needed, typically twice a week).  05/30/16   [provider]  Glycopyrrolate-Formoterol (BEVESPI AEROSPHERE) 9-4.8 MCG/ACT AERO Inhale 2 puffs into the lungs 2 (two) times daily. 10/03/17   Leslye Peer, MD  levofloxacin (LEVAQUIN) 500 MG tablet Take 1 tablet (500 mg total) by mouth daily. 04/10/18   Leslye Peer, MD  Liniments (BEN GAY EX) Apply topically. PRN Arthritis pain    [provider]  niacin 500 MG tablet Take 500 mg by mouth daily.     [provider]  Omega-3 Fatty Acids (FISH OIL) 1360 MG CAPS Take 1 capsule by mouth.    [provider]  predniSONE (DELTASONE) 10 MG tablet 4 tabs x 2 days, 2 tabs x 2 days, 1 tab x 2 days 02/03/17   Nyoka Cowden, MD  predniSONE (DELTASONE) 5 MG tablet TAKE 1 TABLET BY MOUTH ONCE DAILY 12/20/17   Leslye Peer, MD    Salicylic Acid 3 % CREA Apply topically. (Psoriasis Control Cream by Lenon Oms)- Active Ingredient Salicylic Acid 3%)    [provider]  Tiotropium Bromide Monohydrate (SPIRIVA RESPIMAT) 2.5 MCG/ACT AERS 2 puffs once a day 11/17/16 01/24/18  Leslye Peer, MD  traZODone (DESYREL) 50 MG tablet Take 50 mg by mouth at bedtime. 1/2 to 1 tablet at bedtime for sleep    Rankins, Fanny Dance, MD  verapamil (CALAN-SR) 240 MG CR tablet Take 1 tablet (240 mg total) by mouth at bedtime. 08/21/17   Wendall Stade, MD    Physical Exam:  Constitutional: Female who appears to be in some mild respiratory distress at this time after going to use the restroom.  Vitals:   May 04, 2018 1709 2018-05-04 1716 05-04-18 1925  BP: (!) 161/88  (!) 179/85  Pulse: 86  81  Resp: 20  14  Temp: 98.2 F (36.8 C)    TempSrc: Oral    SpO2: (!) 89%  100%  Weight:  73.9 kg   Height:  5\' 7"  (1.702 m)    Eyes: PERRL, lids and conjunctivae normal ENMT: Mucous membranes are dry. Posterior pharynx clear of any exudate or lesions.  Neck: normal, supple, no masses, no thyromegaly Respiratory: Decreased overall aeration.  Patient on 5 L of nasal cannula oxygen at this time able to talk in fairly complete sentences. Cardiovascular: Regular rate and rhythm, no murmurs / rubs / gallops.  +1 bilateral lower extremity edema worse on the left leg. 2+ pedal pulses. No carotid bruits.  Abdomen: no tenderness, no masses palpated. No hepatosplenomegaly. Bowel sounds positive.  Musculoskeletal: no clubbing / cyanosis.  Decreased range of motion of the right shoulder with crepitation present.   Skin: no rashes, lesions, ulcers. No induration Neurologic: CN 2-12 grossly intact. Sensation intact, DTR normal. Strength 5/5 in all 4.  Psychiatric: Normal judgment and insight. Alert and oriented x 3. Normal mood.     Labs on Admission: I have personally reviewed following labs and imaging studies  CBC: Recent Labs  Lab 04/23/18 1828   WBC 4.5  HGB 10.9*  HCT 36.2  MCV 84.0  PLT 190   Basic Metabolic Panel: Recent Labs  Lab Apr 23, 2018 1828  NA 143  K 3.1*  CL 106  CO2 26  GLUCOSE 75  BUN 30*  CREATININE 4.01*  CALCIUM 8.4*   GFR: Estimated Creatinine Clearance: 10.9 mL/min (A) (by C-G formula based on SCr of 4.01 mg/dL (H)). Liver Function Tests: No results for input(s): AST, ALT, ALKPHOS, BILITOT, PROT, ALBUMIN in the last 168 hours. No results for input(s): LIPASE, AMYLASE in the last 168 hours. No results for input(s): AMMONIA in the last 168 hours. Coagulation Profile: No results for input(s): INR, PROTIME in the last 168 hours. Cardiac Enzymes: Recent Labs  Lab 04-23-2018 1828  TROPONINI 0.04*   BNP (last 3 results) No results for input(s): PROBNP in the last 8760 hours. HbA1C: No results for input(s): HGBA1C in the last 72 hours. CBG: Recent Labs  Lab 04-23-2018 1833  GLUCAP 61*   Lipid Profile: No results for input(s): CHOL, HDL, LDLCALC, TRIG, CHOLHDL, LDLDIRECT in the last 72 hours. Thyroid Function Tests: No results for input(s): TSH, T4TOTAL, FREET4, T3FREE, THYROIDAB in the last 72 hours. Anemia Panel: No results for input(s): VITAMINB12, FOLATE, FERRITIN, TIBC, IRON, RETICCTPCT in the last 72 hours. Urine analysis:    Component Value Date/Time   COLORURINE AMBER (A) 08/04/2016 1111   APPEARANCEUR CLOUDY (A) 08/04/2016 1111   LABSPEC 1.028 08/04/2016 1111   PHURINE 5.0 08/04/2016 1111   GLUCOSEU 50 (A) 08/04/2016 1111   HGBUR SMALL (A) 08/04/2016 1111   BILIRUBINUR NEGATIVE 08/04/2016 1111   KETONESUR NEGATIVE 08/04/2016 1111   PROTEINUR >=300 (A) 08/04/2016 1111   UROBILINOGEN 1.0 06/06/2012 1203   NITRITE NEGATIVE 08/04/2016 1111   LEUKOCYTESUR NEGATIVE 08/04/2016 1111   Sepsis Labs: No results found for this or any previous visit (from the past 240 hour(s)).   Radiological Exams on Admission: Dg Chest 2 View  Result Date: 04-23-18 CLINICAL DATA:  Weakness and  increased shortness of breath 3 days. EXAM: CHEST - 2 VIEW COMPARISON:  08/04/2016 FINDINGS: Lungs are adequately inflated without focal airspace consolidation or effusion. There is subtle increased bilateral interstitial markings without change. Mild stable cardiomegaly. Remainder of the exam is unchanged. IMPRESSION: No  acute cardiopulmonary disease. Chronic interstitial disease. Stable cardiomegaly. Electronically Signed   By: Elberta Fortis M.D.   On: 04/21/2018 18:14    EKG: Independently reviewed.  Sinus rhythm at 85 bpm with signs of ST depressions seen in multiple leads and QTc prolonged at 583  Assessment/Plan COPD exacerbation with hypoxic respiratory failure: Acute on chronic.  Patient presents with worsening shortness of breath and increased oxygen requirements despite taking Levaquin.  Initial chest x-ray showing chronic interstitial disease and stable cardiomegaly. - Admit to telemetry bed - COPD exacerbation order set initiated - Continuous pulse oximetry overnight to maintain O2 saturations greater than 92 - Check sputum culture and respiratory virus panel as patient on immunosuppressants - Solu-Medrol 60 mg IV every 8 hours - DuoNebs 4 times daily and as needed shortness of breath/wheezing - Brovana and budesonide nebs - cefepime IV given immunocompromised status - Mucinex - May benefit from VQ scan in a.m. if patient able to lay flat - Physical therapy  Acute renal failure superimposed on chronic kidney disease stage III: Patient with previous baseline creatinine noted to be around 1.6 in 2018, but presents with a creatinine of 4.01 with BUN 30 on admission.  Patient reports poor appetite and continued use of diuretics.  Suspect likely prerenal in nature. - IV fluids of normal saline at 75 mL/h - Check FeUr - Recheck BMP in a.m. - Hold nephrotoxic agents  Elevated troponin, chest pain: Acute.  Initial troponin 0.04 on admission.  Suspect likely related to acute demand, but  on differential includes possibility of PE with decreased ambulation. - Trend troponins - Heparin drip per pharmacy - May need to consult cardiology in a.m.  Prolonged QT interval: Acute. initial QTc 583. - Correct electrolyte abnormalities - Recheck QT interval in a.m.  Rheumatoid arthritis with interstitial lung disease on immunosuppressive therapy: Patient last took Humira on 9/7. - Continue Arava - Patient is due for her Humira injection - Continue outpatient follow-up with rheumatology  Diastolic CHF last EF noted to be 55-60%.  Patient does not appear to be significantly fluid overloaded. - Strict intake and output and daily weights  Iron deficiency anemia: Hemoglobin 10.9 on admission.  - Recheck CBC in a.m.  Essential hypertension - Hold furosemide  Hypokalemia: Acute.  Initial potassium 3.1 on admission. - Give 40 mEq of potassium chloride x1 dose now - Check magnesium level in a.m. - Continue to monitor and replace as needed  Lower extremity edema: Utility of d-dimer likely limited as previously noted to be positive in the past.  Patient reports left leg swelling worse than right and may be worse than previously.  And complains of being more sedimentary recently. -  Doppler duplex ultrasound of lower extremities in a.m.  Hypoglycemia: Initial glucose is at be as low as 61. - Hypoglycemic protocols  DVT prophylaxis: Heparin Code Status: Full Family Communication: *Discussed plan of care with the patient and family present at bedside Disposition Plan: To be determined Consults called: None Admission status: inpatient  Clydie Braun MD [facility] Pager [phone]   If 7PM-7AM, please contact night-coverage [email or web address] Password [facility]  04/21/2018, 9:19 PM

## 2018-04-13 NOTE — ED Notes (Signed)
Pt states that her rheumatoid arthritis is becoming more painful, and would like to see the doctor.

## 2018-04-14 ENCOUNTER — Inpatient Hospital Stay (HOSPITAL_COMMUNITY): Payer: Medicare HMO

## 2018-04-14 DIAGNOSIS — R778 Other specified abnormalities of plasma proteins: Secondary | ICD-10-CM | POA: Diagnosis present

## 2018-04-14 DIAGNOSIS — R7989 Other specified abnormal findings of blood chemistry: Secondary | ICD-10-CM

## 2018-04-14 DIAGNOSIS — E876 Hypokalemia: Secondary | ICD-10-CM | POA: Diagnosis present

## 2018-04-14 DIAGNOSIS — N189 Chronic kidney disease, unspecified: Secondary | ICD-10-CM | POA: Diagnosis present

## 2018-04-14 DIAGNOSIS — N179 Acute kidney failure, unspecified: Secondary | ICD-10-CM | POA: Diagnosis present

## 2018-04-14 DIAGNOSIS — M7989 Other specified soft tissue disorders: Secondary | ICD-10-CM

## 2018-04-14 DIAGNOSIS — R9431 Abnormal electrocardiogram [ECG] [EKG]: Secondary | ICD-10-CM | POA: Diagnosis present

## 2018-04-14 LAB — RESPIRATORY PANEL BY PCR
Adenovirus: NOT DETECTED
BORDETELLA PERTUSSIS-RVPCR: NOT DETECTED
CHLAMYDOPHILA PNEUMONIAE-RVPPCR: NOT DETECTED
CORONAVIRUS NL63-RVPPCR: NOT DETECTED
Coronavirus 229E: NOT DETECTED
Coronavirus HKU1: NOT DETECTED
Coronavirus OC43: NOT DETECTED
INFLUENZA A-RVPPCR: NOT DETECTED
INFLUENZA B-RVPPCR: NOT DETECTED
MYCOPLASMA PNEUMONIAE-RVPPCR: NOT DETECTED
Metapneumovirus: NOT DETECTED
PARAINFLUENZA VIRUS 4-RVPPCR: NOT DETECTED
Parainfluenza Virus 1: NOT DETECTED
Parainfluenza Virus 2: NOT DETECTED
Parainfluenza Virus 3: NOT DETECTED
RESPIRATORY SYNCYTIAL VIRUS-RVPPCR: NOT DETECTED
RHINOVIRUS / ENTEROVIRUS - RVPPCR: NOT DETECTED

## 2018-04-14 LAB — TROPONIN I: Troponin I: 0.04 ng/mL (ref <0.03)

## 2018-04-14 LAB — CREATININE, URINE, RANDOM: CREATININE, URINE: 127.06 mg/dL

## 2018-04-14 LAB — GLUCOSE, CAPILLARY: Glucose-Capillary: 155 mg/dL — ABNORMAL HIGH (ref 70–99)

## 2018-04-14 LAB — STREP PNEUMONIAE URINARY ANTIGEN: STREP PNEUMO URINARY ANTIGEN: NEGATIVE

## 2018-04-14 MED ORDER — HYDRALAZINE HCL 20 MG/ML IJ SOLN
10.0000 mg | INTRAMUSCULAR | Status: DC | PRN
  Administered 2018-04-18: 10 mg via INTRAVENOUS
  Filled 2018-04-14: qty 1

## 2018-04-14 MED ORDER — POLYETHYLENE GLYCOL 3350 17 G PO PACK
17.0000 g | PACK | Freq: Every day | ORAL | Status: DC
  Administered 2018-04-15 – 2018-04-18 (×2): 17 g via ORAL
  Filled 2018-04-14 (×7): qty 1

## 2018-04-14 MED ORDER — TECHNETIUM TC 99M DIETHYLENETRIAME-PENTAACETIC ACID: 31.0000 | Freq: Once | INTRAVENOUS | Status: DC | PRN

## 2018-04-14 MED ORDER — IPRATROPIUM-ALBUTEROL 0.5-2.5 (3) MG/3ML IN SOLN
3.0000 mL | Freq: Two times a day (BID) | RESPIRATORY_TRACT | Status: DC
  Administered 2018-04-14 – 2018-04-18 (×9): 3 mL via RESPIRATORY_TRACT
  Filled 2018-04-14 (×11): qty 3

## 2018-04-14 MED ORDER — HEPARIN SODIUM (PORCINE) 5000 UNIT/ML IJ SOLN
5000.0000 [IU] | Freq: Three times a day (TID) | INTRAMUSCULAR | Status: DC
  Administered 2018-04-17 – 2018-04-18 (×3): 5000 [IU] via SUBCUTANEOUS
  Filled 2018-04-14 (×6): qty 1

## 2018-04-14 MED ORDER — MAGNESIUM SULFATE 2 GM/50ML IV SOLN
2.0000 g | Freq: Once | INTRAVENOUS | Status: AC
  Administered 2018-04-14: 2 g via INTRAVENOUS
  Filled 2018-04-14: qty 50

## 2018-04-14 MED ORDER — TECHNETIUM TO 99M ALBUMIN AGGREGATED: 4.0000 | Freq: Once | INTRAVENOUS | Status: DC | PRN

## 2018-04-14 MED ORDER — METHYLPREDNISOLONE SODIUM SUCC 40 MG IJ SOLR
40.0000 mg | Freq: Two times a day (BID) | INTRAMUSCULAR | Status: DC
  Administered 2018-04-14 – 2018-04-15 (×2): 40 mg via INTRAVENOUS
  Filled 2018-04-14 (×3): qty 1

## 2018-04-14 NOTE — Progress Notes (Signed)
PT Cancellation Note  Patient Details Name: [name] MRN: [id number] DOB: [birth year]   Cancelled Treatment:    Reason Eval/Treat Not Completed: Medical issues which prohibited therapy Per chart review, patient has started heparin drip less than 24 hours ago and is currently awaiting F/U to determine presence of possible DVT and PE.   PT currently awaiting test results for DVT/PE prior to initiating skilled therapy services. Will follow and initiate evaluation when patient is medically appropriate and safe to participate.    Nedra Hai PT, DPT, CBIS  Supplemental Physical Therapist [facility]    Pager [phone] Acute Rehab Office [phone]

## 2018-04-14 NOTE — Progress Notes (Signed)
Patient has refused all lab work draws and CBGs today.Educated on importance of labs to direct treatment. Md aware. Has been up to [facility] and chair with moderate dyspnea. Oxygen sats remain 90's on 3-4 l/min. Melton Alar, RN

## 2018-04-14 NOTE — Progress Notes (Signed)
ANTICOAGULATION CONSULT NOTE  Pharmacy Consult for Heparin Indication: pulmonary embolus  No Known Allergies  Patient Measurements: Height: 5\' 7"  (170.2 cm) Weight: 161 lb 9.6 oz (73.3 kg) IBW/kg (Calculated) : 61.6  Vital Signs: Temp: 98.2 F (36.8 C) (09/14 0624) BP: 153/94 (09/14 0624) Pulse Rate: 84 (09/14 0624)  Labs: Recent Labs    11-May-2018 1828 05/11/2018 2131 04/14/18 0001  HGB 10.9*  --   --   HCT 36.2  --   --   PLT 190  --   --   APTT  --  37*  --   LABPROT  --  13.5  --   INR  --  1.04  --   CREATININE 4.01*  --   --   TROPONINI 0.04*  --  0.04*    Estimated Creatinine Clearance: 10.9 mL/min (A) (by C-G formula based on SCr of 4.01 mg/dL (H)).   Medications: No anticoagulants PTA Infusions:   Assessment: 83 yo F with worsening SOB despite 4 days of antibiotics.  New EKG changes & normal troponin.  No hx VTE, but concerned for PE.  Pharmacy has been consulted to start IV heparin.     Baseline INR, aPTT: WNL  Prior anticoagulation: none  Significant events:  Today, 04/14/2018:  CBC: Hgb slightly low, Plt WNL  Patient refusing heparin lab draws  No bleeding or infusion issues per nursing  CrCl: severe impairment  LE doppler negative for DVT  Goal of Therapy: Heparin level 0.3-0.7 units/ml Monitor platelets by anticoagulation protocol: Yes  Plan:  After discussion with MD, will stop heparin given low pretest probability of PE. MD will f/u results of VQ scan and resume anticoagulation as appropriate  Pharmacy will sign off  04/16/2018, PharmD, BCPS [phone] 04/14/2018, 1:53 PM

## 2018-04-14 NOTE — Progress Notes (Signed)
PROGRESS NOTE    [name]  GPQ:[id number] DOB: 1935-07-18 DOA: 04/09/2018 PCP: Clayborn Heron, MD   Brief Narrative: Patient is 83 year old female with past history of hypertension, COPD, on home oxygen, rheumatoid arthritis on Humira, psoriasis, GERD who presented to the emergency department with complaints of worsening shortness of breath for last 3 to 4 days.  The patient was also having productive cough.  Patient was noted to be hypoxic on presentation.  There was concern for PE on presentation and she was started on heparin drip.    Assessment & Plan:   Principal Problem:   COPD exacerbation (HCC) Active Problems:   Essential hypertension   Rheumatoid arthritis (HCC)   Acute on chronic respiratory failure with hypoxia (HCC)   Hypokalemia   Elevated troponin   Acute renal failure superimposed on chronic kidney disease (HCC)   Prolonged Q-T interval on ECG  Acute respiratory failure with hypoxia: This could be associated  with her COPD or chronic interstitial lung disease.  Currently her respiratory status is stable on supplemental oxygen.  She still complains of subjective dyspnea.  But she is not in any respiratory distress.  Suspected pneumonia: Chest x-ray showed chronic interstitial disease and stable cardiomegaly.  No clear pneumonia visualized on the imagings.  Patient has been started on cefepime which we will continue for now.  COPD: Does not have any wheezes at present.  On steroids.  Steroids have been tapered today.  ILD: Most likely patient has interstitial lung disease secondary to rheumatoid arthritis.  She has bilateral crackles.  Suspected PE: Low probability.  Duplex negative for DVT.  Patient has elevated d-dimer on baseline.  Cannot go for CT chest with contrast due to her kidney function.  We have ordered for VQ scan.  She was started on heparin drip but I will discontinue the heparin drip for now.  If VQ scan so this pulmonary embolism, we can  initiate anticoagulation later.  Acute kidney injury on CKD stage III: Baseline creatinine around 1.6.  Presented with creatinine of 4.01 on admission.  Likely associated with poor oral intake, dehydration.  Use of diuretics at home.  Continue IV fluids.  Will check BMP today.  Elevated troponin: Denies any chest pain at present.  Her chest pain on presentation might be acid with dyspnea.  EKG did not show any ischemic changes.  Mild elevated troponin most likely acid with acute kidney injury and her underlying other problem resulting supply demand ischemia.  Prolonged QT interval: Prolonged QTC of 583 on presentation.  Already improved this morning.  Rheumatoid arthritis: On immunosuppressive therapy.  On Humira at home.  Last she took was on 9/7.  Continue Arava.  Follows with rheumatology outpatient.  Diastolic CHF: Last echocardiogram showed ejection fraction 55 to 60%.  Currently her CHF is compensated.  On Lasix at home.  Essential hypertension: Continue to monitor blood pressure.  Continue current regimen.  Hypokalemia: Present with potassium 3.1.  Potassium supplemented.  Will check levels today.  Lower extremity edema: Probably this is chronic.  Duplex ultrasound of the bilateral lower extremity negative for  DVT.  Hyponatremia: Glucose noted to be in the range of 60s yesterday.  More stable now.  Continue to monitor CBC.   DVT prophylaxis: Heparin [place] Code Status: Full Family Communication: None present at the bedside Disposition Plan: Home after stabilization of the respiratory status   Consultants: None  Procedures: None  Antimicrobials: Cefepime  Subjective: Patient seen and examined at the bedside.  States abdominal pain stable.  She still complains of shortness of breath.  She is not in any kind of respiratory distress.  Denies any chest pain.  Objective: Vitals:   04/07/2018 2355 04/14/18 0624 04/14/18 1010 04/14/18 1019  BP:  (!) 153/94    Pulse:  84    Resp:   16    Temp:  98.2 F (36.8 C)    TempSrc:      SpO2: 93% 96% 91% 91%  Weight:      Height:        Intake/Output Summary (Last 24 hours) at 04/14/2018 1347 Last data filed at 04/14/2018 0600 Gross per 24 hour  Intake 294.12 ml  Output 300 ml  Net -5.88 ml   Filed Weights   04/10/2018 1716 04/05/2018 2247  Weight: 73.9 kg 73.3 kg    Examination:  General exam: Appears calm and comfortable ,Not in distress, chronically looking HEENT:PERRL,Oral mucosa moist, Ear/Nose normal on gross exam Respiratory system: Bilateral crackles  cardiovascular system: S1 & S2 heard, RRR. No JVD, murmurs, rubs, gallops or clicks.  1+ pedal edema. Gastrointestinal system: Abdomen is nondistended, soft and nontender. No organomegaly or masses felt. Normal bowel sounds heard. Central nervous system: Alert and oriented. No focal neurological deficits. Extremities: 1+ pedal edema, no clubbing ,no cyanosis, distal peripheral pulses palpable. Skin: No rashes, lesions or ulcers,no icterus ,no pallor Psychiatry: Judgement and insight appear normal. Mood & affect appropriate.     Data Reviewed: I have personally reviewed following labs and imaging studies  CBC: Recent Labs  Lab 04/11/2018 1828  WBC 4.5  HGB 10.9*  HCT 36.2  MCV 84.0  PLT 190   Basic Metabolic Panel: Recent Labs  Lab 04/05/2018 1828  NA 143  K 3.1*  CL 106  CO2 26  GLUCOSE 75  BUN 30*  CREATININE 4.01*  CALCIUM 8.4*   GFR: Estimated Creatinine Clearance: 10.9 mL/min (A) (by C-G formula based on SCr of 4.01 mg/dL (H)). Liver Function Tests: No results for input(s): AST, ALT, ALKPHOS, BILITOT, PROT, ALBUMIN in the last 168 hours. No results for input(s): LIPASE, AMYLASE in the last 168 hours. No results for input(s): AMMONIA in the last 168 hours. Coagulation Profile: Recent Labs  Lab 04/02/2018 2131  INR 1.04   Cardiac Enzymes: Recent Labs  Lab 04/01/2018 1828 04/14/18 0001  TROPONINI 0.04* 0.04*   BNP (last 3  results) No results for input(s): PROBNP in the last 8760 hours. HbA1C: No results for input(s): HGBA1C in the last 72 hours. CBG: Recent Labs  Lab 04/28/2018 1833  GLUCAP 61*   Lipid Profile: No results for input(s): CHOL, HDL, LDLCALC, TRIG, CHOLHDL, LDLDIRECT in the last 72 hours. Thyroid Function Tests: No results for input(s): TSH, T4TOTAL, FREET4, T3FREE, THYROIDAB in the last 72 hours. Anemia Panel: No results for input(s): VITAMINB12, FOLATE, FERRITIN, TIBC, IRON, RETICCTPCT in the last 72 hours. Sepsis Labs: No results for input(s): PROCALCITON, LATICACIDVEN in the last 168 hours.  Recent Results (from the past 240 hour(s))  Respiratory Panel by PCR     Status: None   Collection Time: 04/14/18 12:03 AM  Result Value Ref Range Status   Adenovirus NOT DETECTED NOT DETECTED Final   Coronavirus 229E NOT DETECTED NOT DETECTED Final   Coronavirus HKU1 NOT DETECTED NOT DETECTED Final   Coronavirus NL63 NOT DETECTED NOT DETECTED Final   Coronavirus OC43 NOT DETECTED NOT DETECTED Final   Metapneumovirus NOT DETECTED NOT DETECTED Final   Rhinovirus / Enterovirus NOT DETECTED  NOT DETECTED Final   Influenza A NOT DETECTED NOT DETECTED Final   Influenza B NOT DETECTED NOT DETECTED Final   Parainfluenza Virus 1 NOT DETECTED NOT DETECTED Final   Parainfluenza Virus 2 NOT DETECTED NOT DETECTED Final   Parainfluenza Virus 3 NOT DETECTED NOT DETECTED Final   Parainfluenza Virus 4 NOT DETECTED NOT DETECTED Final   Respiratory Syncytial Virus NOT DETECTED NOT DETECTED Final   Bordetella pertussis NOT DETECTED NOT DETECTED Final   Chlamydophila pneumoniae NOT DETECTED NOT DETECTED Final   Mycoplasma pneumoniae NOT DETECTED NOT DETECTED Final    Comment: Performed at [facility], [address], [place], [place] [address]         Radiology Studies: Dg Chest 2 View  Result Date: 04/28/2018 CLINICAL DATA:  Weakness and increased shortness of breath 3 days. EXAM: CHEST - 2 VIEW  COMPARISON:  08/04/2016 FINDINGS: Lungs are adequately inflated without focal airspace consolidation or effusion. There is subtle increased bilateral interstitial markings without change. Mild stable cardiomegaly. Remainder of the exam is unchanged. IMPRESSION: No acute cardiopulmonary disease. Chronic interstitial disease. Stable cardiomegaly. Electronically Signed   By: Elberta Fortis M.D.   On: 04/06/2018 18:14        Scheduled Meds: . arformoterol  15 mcg Nebulization BID  . budesonide (PULMICORT) nebulizer solution  0.5 mg Nebulization BID  . guaiFENesin  600 mg Oral BID  . ipratropium-albuterol  3 mL Nebulization BID  . leflunomide  10 mg Oral Daily  . methylPREDNISolone (SOLU-MEDROL) injection  40 mg Intravenous Q12H  . polyethylene glycol  17 g Oral Daily  . verapamil  240 mg Oral QHS   Continuous Infusions: . sodium chloride 100 mL/hr at 04/14/18 0711  . ceFEPime (MAXIPIME) IV 1 g (04/14/18 0202)     LOS: 1 day    Time spent: 35 mins.More than 50% of that time was spent in counseling and/or coordination of care.      Burnadette Pop, MD [facility] Pager [phone]  If 7PM-7AM, please contact night-coverage [email or web address] Password [email or web address] 04/14/2018, 1:47 PM

## 2018-04-14 NOTE — Progress Notes (Signed)
I accepted responsibility for this pt @ 2340 and agree with the assessment of the previous nurse.

## 2018-04-14 NOTE — Progress Notes (Signed)
Bilateral lower extremity venous duplex completed. Preliminary results - There is no evidence of DVT or Baker's cyst. Toma Deiters, RVS 04/14/2018, 10:48 AM

## 2018-04-14 NOTE — Progress Notes (Signed)
OT Cancellation Note  Patient Details Name: NATALE BARBA MRN: [id number] DOB: 1934/08/24   Cancelled Treatment:    Reason Eval/Treat Not Completed: Medical issues which prohibited therapy Pt currently awaiting evaluation to determine if there is DVT/PE. Will follow and initiate OT evaluation once pt is medically ready.  Manuella Ghazi OTR/L Acute Rehabilitation Services Office: [phone] 04/14/2018, 9:04 AM

## 2018-04-15 ENCOUNTER — Inpatient Hospital Stay (HOSPITAL_COMMUNITY): Payer: Medicare HMO

## 2018-04-15 LAB — GLUCOSE, CAPILLARY
GLUCOSE-CAPILLARY: 161 mg/dL — AB (ref 70–99)
GLUCOSE-CAPILLARY: 152 mg/dL — AB (ref 70–99)
GLUCOSE-CAPILLARY: 122 mg/dL — AB (ref 70–99)
Glucose-Capillary: 132 mg/dL — ABNORMAL HIGH (ref 70–99)
Glucose-Capillary: 178 mg/dL — ABNORMAL HIGH (ref 70–99)
Glucose-Capillary: 141 mg/dL — ABNORMAL HIGH (ref 70–99)

## 2018-04-15 LAB — CBC
HEMATOCRIT: 35.2 % — AB (ref 36.0–46.0)
Hemoglobin: 11.1 g/dL — ABNORMAL LOW (ref 12.0–15.0)
MCH: 25.9 pg — AB (ref 26.0–34.0)
MCHC: 31.5 g/dL (ref 30.0–36.0)
MCV: 82.1 fL (ref 78.0–100.0)
Platelets: 242 10*3/uL (ref 150–400)
RBC: 4.29 MIL/uL (ref 3.87–5.11)
RDW: 15.2 % (ref 11.5–15.5)
WBC: 5.9 10*3/uL (ref 4.0–10.5)

## 2018-04-15 LAB — BASIC METABOLIC PANEL
ANION GAP: 13 (ref 5–15)
BUN: 42 mg/dL — AB (ref 8–23)
CALCIUM: 8.4 mg/dL — AB (ref 8.9–10.3)
CO2: 19 mmol/L — AB (ref 22–32)
Chloride: 108 mmol/L (ref 98–111)
Creatinine, Ser: 4.05 mg/dL — ABNORMAL HIGH (ref 0.44–1.00)
GFR calc Af Amer: 11 mL/min — ABNORMAL LOW (ref >60)
GFR calc non Af Amer: 10 mL/min — ABNORMAL LOW (ref >60)
GLUCOSE: 206 mg/dL — AB (ref 70–99)
Potassium: 3.8 mmol/L (ref 3.5–5.1)
Sodium: 140 mmol/L (ref 135–145)

## 2018-04-15 LAB — TROPONIN I: Troponin I: 0.03 ng/mL (ref <0.03)

## 2018-04-15 LAB — UREA NITROGEN, URINE: Urea Nitrogen, Ur: 465 mg/dL

## 2018-04-15 LAB — SODIUM, URINE, RANDOM: SODIUM UR: 53 mmol/L

## 2018-04-15 MED ORDER — BISACODYL 10 MG RE SUPP
10.0000 mg | Freq: Once | RECTAL | Status: AC
  Administered 2018-04-15: 10 mg via RECTAL
  Filled 2018-04-15: qty 1

## 2018-04-15 MED ORDER — FUROSEMIDE 10 MG/ML IJ SOLN
40.0000 mg | Freq: Two times a day (BID) | INTRAMUSCULAR | Status: AC
  Administered 2018-04-15 (×2): 40 mg via INTRAVENOUS
  Filled 2018-04-15 (×2): qty 4

## 2018-04-15 MED ORDER — ONDANSETRON HCL 4 MG/2ML IJ SOLN
4.0000 mg | Freq: Four times a day (QID) | INTRAMUSCULAR | Status: DC | PRN
  Administered 2018-04-15: 4 mg via INTRAVENOUS
  Filled 2018-04-15 (×2): qty 2

## 2018-04-15 MED ORDER — OXYCODONE-ACETAMINOPHEN 5-325 MG PO TABS
1.0000 | ORAL_TABLET | Freq: Four times a day (QID) | ORAL | Status: DC | PRN
  Administered 2018-04-15 – 2018-04-25 (×8): 1 via ORAL
  Filled 2018-04-15 (×9): qty 1

## 2018-04-15 MED ORDER — LORAZEPAM 2 MG/ML IJ SOLN
0.5000 mg | Freq: Once | INTRAMUSCULAR | Status: AC
  Administered 2018-04-15: 0.5 mg via INTRAVENOUS
  Filled 2018-04-15: qty 1

## 2018-04-15 MED ORDER — OXYCODONE HCL 5 MG PO TABS
5.0000 mg | ORAL_TABLET | Freq: Once | ORAL | Status: AC
  Administered 2018-04-15: 5 mg via ORAL
  Filled 2018-04-15: qty 1

## 2018-04-15 NOTE — Evaluation (Signed)
Occupational Therapy Evaluation Patient Details Name: [name] MRN: [id number] DOB: [birth year] Today's Date: 04/15/2018    History of Present Illness Pt is an 83 year old woman admitted with COPD exacerbation with hypoxia. PMH: RA, HTN, interstitial lung disease, neuropathy. Pt is on 3L 02 at home.   Clinical Impression   Pt reports living alone, her husband died last month. Her niece has been assisting with IADL and transportation, but pt performs self care and walks with a rollator. Pt presents with generalized weakness, some confusion and poor balance. She has baseline limitations in her shoulders from her RA. Pt demonstrates poor activity tolerance. Recommending SNF for further rehab upon discharge. Will follow.    Follow Up Recommendations  SNF;Supervision/Assistance - 24 hour    Equipment Recommendations  3 in 1 bedside commode    Recommendations for Other Services       Precautions / Restrictions Precautions Precautions: Fall      Mobility Bed Mobility Overal bed mobility: Needs Assistance Bed Mobility: Supine to Sit     Supine to sit: Supervision     General bed mobility comments: HOB up, use of rail  Transfers Overall transfer level: Needs assistance Equipment used: Rolling walker (2 wheeled) Transfers: Sit to/from [employer] Sit to Stand: Min assist;From elevated surface Stand pivot transfers: Min assist       General transfer comment: increased time, steadying assist, decreased control of descent    Balance Overall balance assessment: Needs assistance   Sitting balance-Leahy Scale: Good Sitting balance - Comments: can reach to feet without LOB   Standing balance support: Bilateral upper extremity supported Standing balance-Leahy Scale: Poor Standing balance comment: B UE support of walker and external assist needed                            ADL either performed or assessed with clinical judgement   ADL  Overall ADL's : Needs assistance/impaired Eating/Feeding: Independent;Bed level   Grooming: Wash/dry hands;Wash/dry face;Sitting;Set up   Upper Body Bathing: Minimal assistance;Sitting   Lower Body Bathing: Sit to/from stand;Maximal assistance   Upper Body Dressing : Minimal assistance;Sitting   Lower Body Dressing: Maximal assistance;Sit to/from stand   Toilet Transfer: Minimal assistance;Stand-pivot;BSC;RW   Toileting- Clothing Manipulation and Hygiene: Moderate assistance;Sit to/from stand         General ADL Comments: Pt vomited after sitting in chair.     Vision Baseline Vision/History: Wears glasses Patient Visual Report: No change from baseline       Perception     Praxis      Pertinent Vitals/Pain Pain Assessment: Faces Faces Pain Scale: Hurts little more Pain Location: shoulders, thighs Pain Descriptors / Indicators: Sore Pain Intervention(s): Monitored during session;Repositioned     Hand Dominance Right   Extremity/Trunk Assessment Upper Extremity Assessment Upper Extremity Assessment: Generalized weakness(longstanding shoulder limitations due to RA)   Lower Extremity Assessment Lower Extremity Assessment: Defer to PT evaluation       Communication Communication Communication: HOH   Cognition Arousal/Alertness: Awake/alert Behavior During Therapy: WFL for tasks assessed/performed Overall Cognitive Status: Impaired/Different from baseline Area of Impairment: Orientation;Memory;Safety/judgement;Problem solving                 Orientation Level: Disoriented to;Place;Time   Memory: Decreased short-term memory   Safety/Judgement: Decreased awareness of safety;Decreased awareness of deficits   Problem Solving: Slow processing;Decreased initiation;Difficulty sequencing;Requires verbal cues General Comments: some difficulty providing hx and home equipment, repeats  herself   General Comments       Exercises     Shoulder Instructions       Home Living Family/patient expects to be discharged to:: Private residence Living Arrangements: Alone Available Help at Discharge: Family;Available PRN/intermittently Type of Home: Apartment Home Access: Level entry     Home Layout: One level     Bathroom Shower/Tub: Producer, television/film/video: Handicapped height     Home Equipment: Environmental consultant - 4 wheels;Other (comment);Grab bars - toilet;Grab bars - tub/shower;Wheelchair - manual;Shower seat(02--3L)          Prior Functioning/Environment Level of Independence: Needs assistance  Gait / Transfers Assistance Needed: walks with a rollator ADL's / Homemaking Assistance Needed: niece helps with transportation and IADL independent in self care and light meal prep   Comments: Pt stating "I need somebody to stay with me at home."        OT Problem List: Decreased strength;Decreased activity tolerance;Decreased range of motion;Impaired balance (sitting and/or standing);Decreased coordination;Decreased cognition;Decreased safety awareness;Decreased knowledge of use of DME or AE;Cardiopulmonary status limiting activity;Pain;Impaired UE functional use;Increased edema      OT Treatment/Interventions: Self-care/ADL training;DME and/or AE instruction;Cognitive remediation/compensation;Therapeutic activities;Patient/family education;Balance training    OT Goals(Current goals can be found in the care plan section) Acute Rehab OT Goals Patient Stated Goal: to get stronger OT Goal Formulation: With patient Time For Goal Achievement: 04/29/18 Potential to Achieve Goals: Good ADL Goals Pt Will Perform Grooming: with min assist;standing Pt Will Perform Upper Body Dressing: with supervision;with set-up;sitting Pt Will Perform Lower Body Dressing: with min assist;with adaptive equipment;sit to/from stand Pt Will Transfer to Toilet: with min assist;ambulating;bedside commode Pt Will Perform Toileting - Clothing Manipulation and hygiene:  with min assist;sit to/from stand Additional ADL Goal #1: Pt will utilizing energy conservation and breathing techniques during ADL and mobility with minimal verbal cues.  OT Frequency: Min 2X/week   Barriers to D/C: Decreased caregiver support          Co-evaluation              AM-PAC PT "6 Clicks" Daily Activity     Outcome Measure Help from another person eating meals?: None Help from another person taking care of personal grooming?: A Little Help from another person toileting, which includes using toliet, bedpan, or urinal?: A Lot Help from another person bathing (including washing, rinsing, drying)?: A Lot Help from another person to put on and taking off regular upper body clothing?: A Little Help from another person to put on and taking off regular lower body clothing?: A Lot 6 Click Score: 16   End of Session Equipment Utilized During Treatment: Gait belt;Rolling walker;Oxygen(3L) Nurse Communication: Other (comment)(aware pt vomited)  Activity Tolerance: Treatment limited secondary to medical complications (Comment);Patient limited by fatigue Patient left: in chair;with call bell/phone within reach  OT Visit Diagnosis: Unsteadiness on feet (R26.81);Other abnormalities of gait and mobility (R26.89);Muscle weakness (generalized) (M62.81);Pain;Other symptoms and signs involving cognitive function                Time: 0932-3557 OT Time Calculation (min): 32 min Charges:  OT General Charges $OT Visit: 1 Visit OT Evaluation $OT Eval Moderate Complexity: 1 Mod OT Treatments $Self Care/Home Management : 8-22 mins  Martie Round, OTR/L Acute Rehabilitation Services Pager: [phone] Office: [phone] Evern Bio 04/15/2018, 10:41 AM

## 2018-04-15 NOTE — Evaluation (Signed)
Physical Therapy Evaluation Patient Details Name: [name] MRN: [id number] DOB: [birth year] Today's Date: 04/15/2018   History of Present Illness  Pt is an 83 year old woman admitted with COPD exacerbation with hypoxia. PMH: RA, HTN, interstitial lung disease, neuropathy. Pt is on 3L 02 at home.  Clinical Impression  Pt admitted with above diagnosis. Pt currently with functional limitations due to the deficits listed below (see PT Problem List).  Pt will benefit from skilled PT to increase their independence and safety with mobility to allow discharge to the venue listed below.  Pt found up in bathroom on arrival (see below for details).  Pt currently requiring min assist for mobility due to unsteadiness and maneuvering RW.  Pt fatigues very quickly as she was only able to ambulate from toilet back to bed, declined ambulating farther into hallway due to fatigue.  Recommend SNF upon d/c at this time.     Follow Up Recommendations SNF    Equipment Recommendations  None recommended by PT    Recommendations for Other Services       Precautions / Restrictions Precautions Precautions: Fall Precaution Comments: chronic 3L O2 at rest, reports increase to 4L O2 with activity      Mobility  Bed Mobility Overal bed mobility: Needs Assistance Bed Mobility: Sit to Supine       Sit to supine: Min assist   General bed mobility comments: pt up on toilet in bathroom off oxygen on arrival, SpO2 73% room air so reapplied 4L O2 [place] and encouraged pursed lip breathing, SpO2 back up to 90% prior to mobilizing; assist required for R LE onto bed  Transfers Overall transfer level: Needs assistance Equipment used: Rolling walker (2 wheeled) Transfers: Sit to/from Stand Sit to Stand: Min assist         General transfer comment: verbal cues for hand placement, assist for rise, steady and controlling descent  Ambulation/Gait Ambulation/Gait assistance: Min assist;+2 safety/equipment(NT  assisting too) Gait Distance (Feet): 10 Feet Assistive device: Rolling walker (2 wheeled) Gait Pattern/deviations: Step-through pattern;Decreased stride length     General Gait Details: verbal cues for use of RW (used to rollator), assist to maneuver RW and for steadying, pt fatigues quickly; assisted from toilet back to bed  Stairs            Wheelchair Mobility    Modified Rankin (Stroke Patients Only)       Balance                                             Pertinent Vitals/Pain Pain Assessment: Faces Faces Pain Scale: Hurts little more Pain Location: R thigh Pain Descriptors / Indicators: Sore(reports chronic due to "arthritis") Pain Intervention(s): Limited activity within patient's tolerance;Repositioned;Monitored during session    Home Living Family/patient expects to be discharged to:: Private residence Living Arrangements: Alone Available Help at Discharge: Family;Available PRN/intermittently Type of Home: Apartment Home Access: Level entry     Home Layout: One level Home Equipment: Walker - 4 wheels;Other (comment);Grab bars - toilet;Grab bars - tub/shower;Wheelchair - Engineer, drilling)      Prior Function Level of Independence: Needs assistance   Gait / Transfers Assistance Needed: walks with a rollator  ADL's / Homemaking Assistance Needed: niece helps with transportation and IADL independent in self care and light meal prep  Comments: Pt stating "I need somebody to stay with  me at home." (per OT)     Hand Dominance   Dominant Hand: Right    Extremity/Trunk Assessment        Lower Extremity Assessment Lower Extremity Assessment: Generalized weakness       Communication   Communication: HOH  Cognition Arousal/Alertness: Awake/alert Behavior During Therapy: WFL for tasks assessed/performed Overall Cognitive Status: Impaired/Different from baseline Area of Impairment: Memory;Safety/judgement;Problem  solving                     Memory: Decreased short-term memory   Safety/Judgement: Decreased awareness of safety;Decreased awareness of deficits   Problem Solving: Slow processing;Decreased initiation;Difficulty sequencing;Requires verbal cues        General Comments      Exercises     Assessment/Plan    PT Assessment Patient needs continued PT services  PT Problem List Decreased strength;Decreased mobility;Decreased activity tolerance;Decreased knowledge of use of DME;Decreased knowledge of precautions;Decreased balance;Decreased safety awareness;Cardiopulmonary status limiting activity       PT Treatment Interventions DME instruction;Therapeutic activities;Gait training;Therapeutic exercise;Patient/family education;Functional mobility training;Balance training    PT Goals (Current goals can be found in the Care Plan section)  Acute Rehab PT Goals PT Goal Formulation: With patient Time For Goal Achievement: 04/22/18 Potential to Achieve Goals: Good    Frequency Min 2X/week   Barriers to discharge        Co-evaluation               AM-PAC PT "6 Clicks" Daily Activity  Outcome Measure Difficulty turning over in bed (including adjusting bedclothes, sheets and blankets)?: A Lot Difficulty moving from lying on back to sitting on the side of the bed? : Unable Difficulty sitting down on and standing up from a chair with arms (e.g., wheelchair, bedside commode, etc,.)?: Unable Help needed moving to and from a bed to chair (including a wheelchair)?: A Little Help needed walking in hospital room?: A Lot Help needed climbing 3-5 steps with a railing? : Total 6 Click Score: 10    End of Session Equipment Utilized During Treatment: Oxygen Activity Tolerance: Patient limited by fatigue Patient left: with bed alarm set;in bed;with nursing/sitter in room;with call bell/phone within reach;with family/visitor present Nurse Communication: Mobility status(aware of  sats) PT Visit Diagnosis: Other abnormalities of gait and mobility (R26.89)    Time: 1345-1400 PT Time Calculation (min) (ACUTE ONLY): 15 min   Charges:   PT Evaluation $PT Eval Low Complexity: 1 Low         Zenovia Jarred, PT, DPT Acute Rehabilitation Services Office: [phone] Pager: [phone]  Sarajane Jews 04/15/2018, 2:44 PM

## 2018-04-15 NOTE — Progress Notes (Signed)
Pt continues to c/o arthritic pain and wants to take her Humira. MD please address this with pt. Thanks, Mick Sell RN

## 2018-04-15 NOTE — Progress Notes (Signed)
PROGRESS NOTE    [name]  KJZ:[id number] DOB: 01-10-1935 DOA: 04/14/2018 PCP: Clayborn Heron, MD   Brief Narrative: Patient is 83 year old female with past history of hypertension, COPD, on home oxygen, rheumatoid arthritis on Humira, psoriasis, GERD who presented to the emergency department with complaints of worsening shortness of breath for last 3 to 4 days.  The patient was also having productive cough.  Patient was noted to be hypoxic on presentation.  There was concern for PE on presentation and she was started on heparin drip.  Currently her respiratory status is slowly improving.   Assessment & Plan:   Principal Problem:   COPD exacerbation (HCC) Active Problems:   Essential hypertension   Rheumatoid arthritis (HCC)   Acute on chronic respiratory failure with hypoxia (HCC)   Hypokalemia   Elevated troponin   Acute renal failure superimposed on chronic kidney disease (HCC)   Prolonged Q-T interval on ECG  Acute respiratory failure with hypoxia: This could be associated  with her COPD or chronic interstitial lung disease.  Currently her respiratory status is stable on supplemental oxygen.  She still complains of subjective dyspnea.  But she is not in any respiratory distress.  Suspected pneumonia: Chest x-ray showed chronic interstitial disease and stable cardiomegaly.  No clear pneumonia visualized on the imagings.  Antibiotics will be discontinued.  COPD: Does not have any wheezes at present.  On steroids.  Steroids have been tapered today.  ILD: Most likely patient has interstitial lung disease secondary to rheumatoid arthritis.  She has bilateral crackles.  Suspected PE: VQ scan negative for pulmonary embolism.  Heparin drip discontinued.  Acute kidney injury on CKD stage III: Baseline creatinine around 1.6.  Presented with creatinine of 4.01 on admission.  She looks volume overloaded.  IV fluids stopped and she has been started on Lasix.  Elevated troponin:  Denies any chest pain at present.  Her chest pain on presentation might be associated with dyspnea.  EKG did not show any ischemic changes.  Mild elevated troponin most likely acid with acute kidney injury and her underlying other problem resulting supply demand ischemia.  Prolonged QT interval: Prolonged QTC of 583 on presentation. Will follow up EKG.  Rheumatoid arthritis: On immunosuppressive therapy.  On Humira at home.  Last she took was on 9/7.  Continue Arava.  Follows with rheumatology outpatient.  Diastolic CHF: Last echocardiogram showed ejection fraction 55 to 60%.  Currently her CHF is compensated.  On Lasix at home.  Essential hypertension: Continue to monitor blood pressure.  Continue current regimen.  Hypokalemia: Present with potassium 3.1.  Potassium supplemented.   Lower extremity edema: Probably this is chronic.  Duplex ultrasound of the bilateral lower extremity negative for  DVT.  Hyponatremia: Glucose noted to be in the range of 60s on presentation.  More stable now.  Continue to monitor CBC.  Deconditioning/debility: Patient evaluated by physical therapy and recommended skilled nursing facility on discharge.  Social worker consulted.   DVT prophylaxis: Heparin [place] Code Status: Full Family Communication: None present at the bedside Disposition Plan: SNF after improvement in the kidney function   Consultants: None  Procedures: None  Antimicrobials: None  Subjective:  Patient  seen and examined the bedside this morning.  She looked comfortable during my evaluation.  Hemodynamically stable.  She still complains of subjective dyspnea.  Denies any chest pain.   Objective: Vitals:   04/15/18 0419 04/15/18 0731 04/15/18 0736 04/15/18 0745  BP: 131/84     Pulse:  86     Resp: 16     Temp: 98.6 F (37 C)     TempSrc: Oral     SpO2: 97% 91% 91% 91%  Weight: 77.1 kg     Height:        Intake/Output Summary (Last 24 hours) at 04/15/2018 1109 Last data filed  at 04/15/2018 0957 Gross per 24 hour  Intake 1022.51 ml  Output -  Net 1022.51 ml   Filed Weights   04/04/2018 1716 04/07/2018 2247 04/15/18 0419  Weight: 73.9 kg 73.3 kg 77.1 kg    Examination:  General exam: Appears calm and comfortable ,Not in distress, chronically looking HEENT:PERRL,Oral mucosa moist, Ear/Nose normal on gross exam Respiratory system: Bilateral crackles  cardiovascular system: S1 & S2 heard, RRR. No JVD, murmurs, rubs, gallops or clicks.  2+ pedal edema. Gastrointestinal system: Abdomen is nondistended, soft and nontender. No organomegaly or masses felt. Normal bowel sounds heard. Central nervous system: Alert and oriented. No focal neurological deficits. Extremities: 2+ pedal edema, no clubbing ,no cyanosis, distal peripheral pulses palpable. Skin: No rashes, lesions or ulcers,no icterus ,no pallor Psychiatry: Judgement and insight appear normal. Mood & affect appropriate.     Data Reviewed: I have personally reviewed following labs and imaging studies  CBC: Recent Labs  Lab 04/12/2018 1828 04/15/18 0619  WBC 4.5 5.9  HGB 10.9* 11.1*  HCT 36.2 35.2*  MCV 84.0 82.1  PLT 190 242   Basic Metabolic Panel: Recent Labs  Lab 04/24/2018 1828 04/15/18 0619  NA 143 140  K 3.1* 3.8  CL 106 108  CO2 26 19*  GLUCOSE 75 206*  BUN 30* 42*  CREATININE 4.01* 4.05*  CALCIUM 8.4* 8.4*   GFR: Estimated Creatinine Clearance: 11.9 mL/min (A) (by C-G formula based on SCr of 4.05 mg/dL (H)). Liver Function Tests: No results for input(s): AST, ALT, ALKPHOS, BILITOT, PROT, ALBUMIN in the last 168 hours. No results for input(s): LIPASE, AMYLASE in the last 168 hours. No results for input(s): AMMONIA in the last 168 hours. Coagulation Profile: Recent Labs  Lab 04/15/2018 2131  INR 1.04   Cardiac Enzymes: Recent Labs  Lab 04/03/2018 1828 04/14/18 0001 04/15/18 0619  TROPONINI 0.04* 0.04* 0.03*   BNP (last 3 results) No results for input(s): PROBNP in the last 8760  hours. HbA1C: No results for input(s): HGBA1C in the last 72 hours. CBG: Recent Labs  Lab 04/03/2018 1833 04/14/18 2002 04/14/18 2355 04/15/18 0421 04/15/18 0725  GLUCAP 61* 155* 178* 132* 161*   Lipid Profile: No results for input(s): CHOL, HDL, LDLCALC, TRIG, CHOLHDL, LDLDIRECT in the last 72 hours. Thyroid Function Tests: No results for input(s): TSH, T4TOTAL, FREET4, T3FREE, THYROIDAB in the last 72 hours. Anemia Panel: No results for input(s): VITAMINB12, FOLATE, FERRITIN, TIBC, IRON, RETICCTPCT in the last 72 hours. Sepsis Labs: No results for input(s): PROCALCITON, LATICACIDVEN in the last 168 hours.  Recent Results (from the past 240 hour(s))  Respiratory Panel by PCR     Status: None   Collection Time: 04/14/18 12:03 AM  Result Value Ref Range Status   Adenovirus NOT DETECTED NOT DETECTED Final   Coronavirus 229E NOT DETECTED NOT DETECTED Final   Coronavirus HKU1 NOT DETECTED NOT DETECTED Final   Coronavirus NL63 NOT DETECTED NOT DETECTED Final   Coronavirus OC43 NOT DETECTED NOT DETECTED Final   Metapneumovirus NOT DETECTED NOT DETECTED Final   Rhinovirus / Enterovirus NOT DETECTED NOT DETECTED Final   Influenza A NOT DETECTED NOT DETECTED Final  Influenza B NOT DETECTED NOT DETECTED Final   Parainfluenza Virus 1 NOT DETECTED NOT DETECTED Final   Parainfluenza Virus 2 NOT DETECTED NOT DETECTED Final   Parainfluenza Virus 3 NOT DETECTED NOT DETECTED Final   Parainfluenza Virus 4 NOT DETECTED NOT DETECTED Final   Respiratory Syncytial Virus NOT DETECTED NOT DETECTED Final   Bordetella pertussis NOT DETECTED NOT DETECTED Final   Chlamydophila pneumoniae NOT DETECTED NOT DETECTED Final   Mycoplasma pneumoniae NOT DETECTED NOT DETECTED Final    Comment: Performed at [facility], [address] [place], [place] [address]         Radiology Studies: Dg Chest 2 View  Result Date: 05-01-18 CLINICAL DATA:  Weakness and increased shortness of breath 3 days.  EXAM: CHEST - 2 VIEW COMPARISON:  08/04/2016 FINDINGS: Lungs are adequately inflated without focal airspace consolidation or effusion. There is subtle increased bilateral interstitial markings without change. Mild stable cardiomegaly. Remainder of the exam is unchanged. IMPRESSION: No acute cardiopulmonary disease. Chronic interstitial disease. Stable cardiomegaly. Electronically Signed   By: Elberta Fortis M.D.   On: 05-01-18 18:14   US Renal  Result Date: 04/15/2018 CLINICAL DATA:  Acute kidney injury EXAM: RENAL / URINARY TRACT ULTRASOUND COMPLETE COMPARISON:  03/04/2009 abdominal CT FINDINGS: Right Kidney: Length: 10.2 cm. Echogenic cortex without cortical thinning. Four simple appearing cysts measuring up to 2.8 cm. No hydronephrosis or solid mass. Left Kidney: Length: 9.5 cm.  Echogenic cortex.  No hydronephrosis or mass. Bladder: Probable pelvic floor laxity.  No abnormal finding at the bladder. IMPRESSION: 1. No hydronephrosis or other acute finding. 2. Medical renal disease without atrophy. Electronically Signed   By: Marnee Spring M.D.   On: 04/15/2018 10:08   Nm Pulmonary Perf And Vent  Result Date: 04/14/2018 CLINICAL DATA:  Shortness of breath and cough. Evaluate for pulmonary embolism. EXAM: NUCLEAR MEDICINE VENTILATION - PERFUSION LUNG SCAN TECHNIQUE: Ventilation images were obtained in multiple projections using inhaled aerosol Tc-52m DTPA. Perfusion images were obtained in multiple projections after intravenous injection of Tc-60m-MAA. RADIOPHARMACEUTICALS:  31 mCi of Tc-69m DTPA aerosol inhalation and 4 mCi Tc68m-MAA IV COMPARISON:  Chest radiograph-May 01, 2018; 08/04/2016; 04/21/2016 FINDINGS: Review of chest radiograph performed 01-May-2018 demonstrates grossly unchanged enlarged cardiac silhouette and mediastinal contours with atherosclerotic plaque when the thoracic aorta. Mild cephalization of flow without frank evidence of edema. Mild diffuse slightly nodular thickening of the  pulmonary interstitium. Minimal pleuroparenchymal thickening about the right minor fissure. No definite pleural effusion. No pneumothorax. Ventilation: There is minimal clumping of inhaled radiotracer about the bilateral pulmonary hila. Ingested radiotracer is seen within the mouth and hypopharynx. Perfusion: There is relative homogeneous distribution of injected radiotracer throughout the bilateral pulmonary parenchyma. No discrete mismatched filling defects to suggest pulmonary embolism. IMPRESSION: Pulmonary embolism absent (very low probability of pulmonary embolism). Electronically Signed   By: Simonne Come M.D.   On: 04/14/2018 17:50        Scheduled Meds: . arformoterol  15 mcg Nebulization BID  . budesonide (PULMICORT) nebulizer solution  0.5 mg Nebulization BID  . furosemide  40 mg Intravenous Q12H  . guaiFENesin  600 mg Oral BID  . heparin injection (subcutaneous)  5,000 Units Subcutaneous Q8H  . ipratropium-albuterol  3 mL Nebulization BID  . leflunomide  10 mg Oral Daily  . methylPREDNISolone (SOLU-MEDROL) injection  40 mg Intravenous Q12H  . polyethylene glycol  17 g Oral Daily  . verapamil  240 mg Oral QHS   Continuous Infusions: . ceFEPime (MAXIPIME)  IV Stopped (04/14/18 2303)     LOS: 2 days    Time spent: 25 mins.More than 50% of that time was spent in counseling and/or coordination of care.      Burnadette Pop, MD [facility] Pager [phone]  If 7PM-7AM, please contact night-coverage [email or web address] Password [email or web address] 04/15/2018, 11:09 AM

## 2018-04-16 ENCOUNTER — Encounter (HOSPITAL_COMMUNITY): Payer: Self-pay | Admitting: Nephrology

## 2018-04-16 LAB — BASIC METABOLIC PANEL
ANION GAP: 11 (ref 5–15)
BUN: 55 mg/dL — ABNORMAL HIGH (ref 8–23)
CALCIUM: 8.5 mg/dL — AB (ref 8.9–10.3)
CO2: 23 mmol/L (ref 22–32)
Chloride: 108 mmol/L (ref 98–111)
Creatinine, Ser: 4.37 mg/dL — ABNORMAL HIGH (ref 0.44–1.00)
GFR calc Af Amer: 10 mL/min — ABNORMAL LOW (ref >60)
GFR, EST NON AFRICAN AMERICAN: 9 mL/min — AB (ref >60)
Glucose, Bld: 135 mg/dL — ABNORMAL HIGH (ref 70–99)
Potassium: 4.3 mmol/L (ref 3.5–5.1)
SODIUM: 142 mmol/L (ref 135–145)

## 2018-04-16 LAB — GLUCOSE, CAPILLARY
GLUCOSE-CAPILLARY: 122 mg/dL — AB (ref 70–99)
GLUCOSE-CAPILLARY: 123 mg/dL — AB (ref 70–99)
GLUCOSE-CAPILLARY: 163 mg/dL — AB (ref 70–99)
GLUCOSE-CAPILLARY: 99 mg/dL (ref 70–99)
Glucose-Capillary: 117 mg/dL — ABNORMAL HIGH (ref 70–99)
Glucose-Capillary: 129 mg/dL — ABNORMAL HIGH (ref 70–99)

## 2018-04-16 LAB — LEGIONELLA PNEUMOPHILA SEROGP 1 UR AG: L. PNEUMOPHILA SEROGP 1 UR AG: NEGATIVE

## 2018-04-16 MED ORDER — PHENOL 1.4 % MT LIQD
1.0000 | OROMUCOSAL | Status: DC | PRN
  Filled 2018-04-16: qty 177

## 2018-04-16 MED ORDER — MUSCLE RUB 10-15 % EX CREA
TOPICAL_CREAM | CUTANEOUS | Status: DC | PRN
  Administered 2018-04-16: 15:00:00 via TOPICAL
  Filled 2018-04-16: qty 85

## 2018-04-16 MED ORDER — CHLORHEXIDINE GLUCONATE CLOTH 2 % EX PADS
6.0000 | MEDICATED_PAD | Freq: Every day | CUTANEOUS | Status: DC
  Administered 2018-04-17 – 2018-04-18 (×2): 6 via TOPICAL

## 2018-04-16 NOTE — Progress Notes (Signed)
After discussion with Dr. Arlean Hopping, patient will be transferred to [facility] for initiation of dialysis.

## 2018-04-16 NOTE — Consult Note (Signed)
Renal Service Consult Note [facility]  [name] 04/16/2018 Sol Blazing Requesting Physician: Dr Tawanna Solo   Reason for Consult:  AKI  HPI: The patient is a 83 y.o. year-old with hx of RA, psoriasis, ILD on home O2, HTN, COPD, DJD came to ED on 9/13 with c/o SOB, gen weakness. Takes home O2. +prod cough w green sputum. Taking levaquin called in by lung MD.  Last steroids was about 3 mos ago.   Creat in ED was 4.0 (1.4 in the past years), CXR was neg, V/Q was negative.  CXR  9/15 was negative. We are asked to see for new acute/ chronic renal failure.   Pt is f/b Dr Florene Glen at [facility], most recent creat was 3.2 in May 2019.    She has "gone downhill the last 2 mos", prior to that was able to do chores at her place, now her niece is doing most of the work.  Problems w/ DOE and severe fatigue.  +/-  Leg edema.  No voiding issues. Also having sig nausea.     Renal US > 9.5/ 10.2 cm kidneys, echogenic cortex, no hydro either side  UA 9/13 > clear, yellow, >300 prot, 6-10 rbc, 0-5 wbc  UNa 53, UCr 127    Date  Creat  eGFR 2009- 13 1.2- 1.5 09/2015  1.35  42 08/2016  1.61  34 May 2019 3.24  From [facility] labs , eGFR 15 ml/min    Old chart:   Jan 2010 > CHF diast, SOB/ HTN/ AKI/ RA/ COPD; multifact SOB w/ copd/ CHF/ poss OSA/ OHS, MO and R HF. Diuresed and SOB better, dc'd .    Jan 2012 > lower GIB after polypectomy, ABL anemia, dehyrdation, COPD/ home o2  Jan 2018 > cough, severe SOB/ wheezing, admitted found to have SOB multifact due to underlying ILD w/ also PNA and flu.  COPD poss contributing, CHF prob not. Usual Rx. DC"d.   ROS  denies CP  no joint pain   no HA  no blurry vision  no rash  no diarrhea  no nausea/ vomiting  no dysuria  no difficulty voiding  no change in urine color    Past Medical History  Past Medical History:  Diagnosis Date  . Arthritis   . COPD (chronic obstructive pulmonary disease) ([place])   . Dysphagia   . Emphysema   . GERD  (gastroesophageal reflux disease)   . Hypertension   . Insomnia   . Interstitial lung disease ([place])   . Kidney stone   . Neuropathy   . On home O2    2L N/C  . Osteopenia   . Pancreatitis   . Psoriasis   . Psoriasis   . Pthirus inguinalis   . Renal disorder   . Rheumatoid arthritis(714.0)   . Vertigo   . Weight loss    Past Surgical History  Past Surgical History:  Procedure Laterality Date  . CHOLECYSTECTOMY    . VESICOVAGINAL FISTULA CLOSURE W/ TAH  1962   Family History  Family History  Problem Relation Age of Onset  . Heart disease Sister   . Colon cancer Sister   . Asthma Sister    Social History  reports that she quit smoking about 17 years ago. Her smoking use included cigarettes. She has a 25.00 pack-year smoking history. She has never used smokeless tobacco. She reports that she does not drink alcohol or use drugs. Allergies No Known Allergies Home medications Prior to Admission  medications   Medication Sig Start Date End Date Taking? Authorizing Provider  Adalimumab (HUMIRA) 40 MG/0.8ML PSKT Inject 40 mg into the skin every 14 (fourteen) days.   Yes [provider]  albuterol (PROVENTIL HFA;VENTOLIN HFA) 108 (90 Base) MCG/ACT inhaler Inhale 2 puffs into the lungs every 6 (six) hours as needed for wheezing or shortness of breath. 04/21/16  Yes Rigoberto Noel, MD  Chlorphen-Phenyleph-ASA (ALKA-SELTZER PLUS COLD) 2-7.8-325 MG TBEF Take 2 tablets by mouth every 6 (six) hours as needed. Cough and congestion   Yes [provider]  furosemide (LASIX) 40 MG tablet Take 20 mg by mouth as needed for fluid.  05/30/16  Yes [provider]  ibuprofen (ADVIL,MOTRIN) 200 MG tablet Take 400 mg by mouth every 6 (six) hours as needed for moderate pain.   Yes [provider]  leflunomide (ARAVA) 10 MG tablet Take 10 mg by mouth daily.   Yes [provider]  levofloxacin (LEVAQUIN) 500 MG tablet Take 1 tablet (500 mg total) by mouth daily.  04/10/18  Yes Byrum, Rose Fillers, MD  Liniments (BEN GAY EX) Apply 1 application topically 3 (three) times daily as needed. PRN Arthritis pain    Yes [provider]  Salicylic Acid 3 % CREA Apply 1 application topically daily as needed. (Psoriasis Control Cream by Katherina Mires)- Active Ingredient Salicylic Acid 3%)    Yes [provider]  verapamil (CALAN-SR) 240 MG CR tablet Take 1 tablet (240 mg total) by mouth at bedtime. 08/21/17  Yes Josue Hector, MD  azithromycin (ZITHROMAX) 250 MG tablet Take 2 pills today then one a day for 4 additional days Patient not taking: Reported on 04/20/2018 01/25/18   Collene Gobble, MD  budesonide-formoterol [employer]) 160-4.5 MCG/ACT inhaler Inhale 2 puffs into the lungs 2 (two) times daily. Patient not taking: Reported on 04/24/2018 09/16/16   Parrett, Fonnie Mu, NP  Glycopyrrolate-Formoterol (BEVESPI AEROSPHERE) 9-4.8 MCG/ACT AERO Inhale 2 puffs into the lungs 2 (two) times daily. Patient not taking: Reported on 04/08/2018 10/03/17   Collene Gobble, MD  predniSONE (DELTASONE) 10 MG tablet 4 tabs x 2 days, 2 tabs x 2 days, 1 tab x 2 days Patient not taking: Reported on 04/22/2018 02/03/17   Tanda Rockers, MD  predniSONE (DELTASONE) 5 MG tablet TAKE 1 TABLET BY MOUTH ONCE DAILY Patient not taking: Reported on 04/19/2018 12/20/17   Collene Gobble, MD  Tiotropium Bromide Monohydrate (SPIRIVA RESPIMAT) 2.5 MCG/ACT AERS 2 puffs once a day Patient not taking: Reported on 04/20/2018 11/17/16 01/24/18  Collene Gobble, MD   Liver Function Tests No results for input(s): AST, ALT, ALKPHOS, BILITOT, PROT, ALBUMIN in the last 168 hours. No results for input(s): LIPASE, AMYLASE in the last 168 hours. CBC Recent Labs  Lab 04/03/2018 1828 04/15/18 0619  WBC 4.5 5.9  HGB 10.9* 11.1*  HCT 36.2 35.2*  MCV 84.0 82.1  PLT 190 341   Basic Metabolic Panel Recent Labs  Lab 04/06/2018 1828 04/15/18 0619 04/16/18 0834  NA 143 140 142  K 3.1* 3.8 4.3  CL 106 108 108   CO2 26 19* 23  GLUCOSE 75 206* 135*  BUN 30* 42* 55*  CREATININE 4.01* 4.05* 4.37*  CALCIUM 8.4* 8.4* 8.5*   Iron/TIBC/Ferritin/ %Sat No results found for: IRON, TIBC, FERRITIN, IRONPCTSAT  Vitals:   04/16/18 0420 04/16/18 0500 04/16/18 0747 04/16/18 1305  BP: (!) 147/85   (!) 153/80  Pulse: 87   87  Resp: 12  16  Temp: 97.8 F (36.6 C)   97.9 F (36.6 C)  TempSrc: Oral   Oral  SpO2: 91%  91% 94%  Weight:  76 kg    Height:       Exam Gen pleasant, frail, no distress, nasal O2 No rash, cyanosis or gangrene Sclera anicteric, throat clear  +JVD Chest slightly rales bibasilar, no wheezing or bronch BS RRR w/ 2/6 sem no RG Abd soft ntnd no mass or ascites +bs GU defer MS no joint effusions or deformity Ext 1+ bilat LE edema, no wounds or ulcers Neuro is alert, Ox 3 , nf    Home meds:  - adalimumab 40 sq every 2wks/ leflunomide 10 qd  - triotropium bromide monohydrate 2 puffs qd/ pred 94m qd/ glycopyrrolate-Formoterol  2 puffs bid/ budesonide-formoterol 160-4.5 2 puffs bid/ albuterol nebs prn  - furosemide 20 mg qd prn/ ibuprofen prn/ verapamil 240 cr qd  - levofloxacine 500 qd/ azithromycin 250 ii qd for 4d     Impression: 1. CKD stage V - pt has progressive CKD w uremia now.  As of a couple mos ago she was relatively self-sufficient.  Discussed options and pt wishes to go ahead w/ dialysis.  [facility]  Start CLIP process.   2. COPD/ ILD - no CHF on cxr, home O2 3. RA 4. Volume - +some LE edema, BP's up 5. HTN 6. Anemia - Hb 10- 11, no esa needed   Plan - as above  RKelly SplinterMD CNewell Rubbermaidpager [phone]  04/16/2018, 2:51 PM

## 2018-04-16 NOTE — Care Management Note (Signed)
Case Management Note  Patient Details  Name: [name] MRN: [id number] Date of Birth: Dec 18, 1934  Subjective/Objective:  Pt admitted with SOB, COPD exacerbation with hypoxic respiratory failure.               Action/Plan:  Plan to discharge [facility] with [facility] for COPD program, HHPT. Referral given to in house rep with [facility].    Expected Discharge Date:                  Expected Discharge Plan:  Home w Home Health Services  In-House Referral:     Discharge planning Services  CM Consult  Post Acute Care Choice:  Home Health Choice offered to:  Patient  DME Arranged:    DME Agency:     HH Arranged:  RN, PT HH Agency:  [facility]  Status of Service:     If discussed at Long Length of Stay Meetings, dates discussed:    Additional CommentsGeni Bers, RN 04/16/2018, 1:05 PM

## 2018-04-16 NOTE — Care Management Important Message (Signed)
Important Message  Patient Details  Name: TRENIKA HUDSON MRN: [id number] Date of Birth: 09/22/1934   Medicare Important Message Given:  Yes    Caren Macadam 04/16/2018, 11:22 AMImportant Message  Patient Details  Name: GERA INBODEN MRN: [id number] Date of Birth: [birth year]   Medicare Important Message Given:  Yes    Caren Macadam 04/16/2018, 11:22 AM

## 2018-04-16 NOTE — Progress Notes (Addendum)
PROGRESS NOTE    [name]  DGU:[id number] DOB: [birth year] DOA: 04/05/2018 PCP: Clayborn Heron, MD   Brief Narrative: Patient is 83 year old female with past history of hypertension, COPD, on home oxygen, rheumatoid arthritis on Humira, psoriasis, GERD who presented to the emergency department with complaints of worsening shortness of breath for last 3 to 4 days.  The patient was also having productive cough.  Patient was noted to be hypoxic on presentation.  There was concern for PE on presentation and she was started on heparin drip which has been stopped.  Currently her respiratory status is slowly improving.  Her new issue right now is acute kidney injury on CKD.  I have requested for nephrology evaluation today.  Assessment & Plan:   Principal Problem:   COPD exacerbation (HCC) Active Problems:   Essential hypertension   Rheumatoid arthritis (HCC)   Acute on chronic respiratory failure with hypoxia (HCC)   Hypokalemia   Elevated troponin   Acute renal failure superimposed on chronic kidney disease (HCC)   Prolonged Q-T interval on ECG  Acute respiratory failure with hypoxia: This could be associated  with her COPD or chronic interstitial lung disease.  Currently her respiratory status is stable on supplemental oxygen.  She still complains of subjective dyspnea.  But she is not in any respiratory distress.  Suspected pneumonia: Chest x-ray showed chronic interstitial disease and stable cardiomegaly.  No clear pneumonia visualized on the imagings.  Antibiotics will be discontinued.  COPD: Does not have any wheezes at present.    Steroids have been discontinued.  Possible ILD: Most likely patient has interstitial lung disease secondary to rheumatoid arthritis.  She has basal bilateral crackles which could be from volume overload also.  Suspected PE: VQ scan negative for pulmonary embolism.  Heparin drip discontinued.  Acute kidney injury on CKD stage III: Baseline  creatinine around 1.6.  Presented with creatinine of 4.01 on admission.  She was initially suspected to be dehydrated and was given some fluids.  Creatinine did not improve.  Yesterday she was found to have volume overload with bilateral pedal edema and basilar crackles.  Given 2 doses of Lasix yesterday.  Kidney function worse today.  Nephrology consulted.  Ultrasound of the kidneys is consistent with medical renal disease.  Elevated troponin: Denies any chest pain at present.  Her chest pain on presentation might be associated with dyspnea.  EKG did not show any ischemic changes.  Mild elevated troponin most likely acid with acute kidney injury and her underlying other problem resulting supply demand ischemia.  Prolonged QT interval: Prolonged QTC of 583 on presentation. Last one showed 509.Will follow up EKG today.  Rheumatoid arthritis: On immunosuppressive therapy.  On Humira at home.  Last she took was on 9/7.  Continue Arava.  Follows with rheumatology outpatient.  Diastolic CHF: Last echocardiogram showed ejection fraction 55 to 60%.  On Lasix at home.  Currently she is not in acute heart failure.  Her bilateral lower extremity edema has improved today.  Essential hypertension: Continue to monitor blood pressure.  Continue current regimen.  Hypokalemia: Potassium supplemented and corrected.  Lower extremity edema:   Duplex ultrasound of the bilateral lower extremity negative for  DVT.  Hypoglycemia: Glucose noted to be in the range of 60s on presentation.  More stable now.  Continue to monitor CBC.  Deconditioning/debility: Patient evaluated by physical therapy and recommended skilled nursing facility on discharge.  Social worker consulted.   DVT prophylaxis: Heparin [place] Code Status:  Full Family Communication: None present at the bedside Disposition Plan: SNF after improvement in the kidney function   Consultants: None  Procedures: None  Antimicrobials:  None  Subjective:  Patient seen and examined at the bedside this morning.  Remains comfortable but complains of dyspnea on and off.  Shortness of breath has much improved.  She is not in any kind of distress.  Her kidney function is worse today.  Objective: Vitals:   04/15/18 2117 04/16/18 0420 04/16/18 0500 04/16/18 0747  BP:  (!) 147/85    Pulse:  87    Resp:  12    Temp:  97.8 F (36.6 C)    TempSrc:  Oral    SpO2: 93% 91%  91%  Weight:   76 kg   Height:        Intake/Output Summary (Last 24 hours) at 04/16/2018 1146 Last data filed at 04/16/2018 0115 Gross per 24 hour  Intake -  Output 650 ml  Net -650 ml   Filed Weights   04/18/18 2247 04/15/18 0419 04/16/18 0500  Weight: 73.3 kg 77.1 kg 76 kg    Examination:  General exam: Appears calm and comfortable ,Not in distress,average built HEENT:PERRL,Oral mucosa moist, Ear/Nose normal on gross exam Respiratory system: Bilateral basal crackles Cardiovascular system: S1 & S2 heard, RRR. No JVD, murmurs, rubs, gallops or clicks. Gastrointestinal system: Abdomen is nondistended, soft and nontender. No organomegaly or masses felt. Normal bowel sounds heard. Central nervous system: Alert and oriented. No focal neurological deficits. Extremities: No edema, no clubbing ,no cyanosis, distal peripheral pulses palpable.1 + pedal edema Skin: No rashes, lesions or ulcers,no icterus ,no pallor     Data Reviewed: I have personally reviewed following labs and imaging studies  CBC: Recent Labs  Lab Apr 18, 2018 1828 04/15/18 0619  WBC 4.5 5.9  HGB 10.9* 11.1*  HCT 36.2 35.2*  MCV 84.0 82.1  PLT 190 242   Basic Metabolic Panel: Recent Labs  Lab 18-Apr-2018 1828 04/15/18 0619 04/16/18 0834  NA 143 140 142  K 3.1* 3.8 4.3  CL 106 108 108  CO2 26 19* 23  GLUCOSE 75 206* 135*  BUN 30* 42* 55*  CREATININE 4.01* 4.05* 4.37*  CALCIUM 8.4* 8.4* 8.5*   GFR: Estimated Creatinine Clearance: 10.9 mL/min (A) (by C-G formula based on  SCr of 4.37 mg/dL (H)). Liver Function Tests: No results for input(s): AST, ALT, ALKPHOS, BILITOT, PROT, ALBUMIN in the last 168 hours. No results for input(s): LIPASE, AMYLASE in the last 168 hours. No results for input(s): AMMONIA in the last 168 hours. Coagulation Profile: Recent Labs  Lab 18-Apr-2018 2131  INR 1.04   Cardiac Enzymes: Recent Labs  Lab 2018/04/18 1828 04/14/18 0001 04/15/18 0619  TROPONINI 0.04* 0.04* 0.03*   BNP (last 3 results) No results for input(s): PROBNP in the last 8760 hours. HbA1C: No results for input(s): HGBA1C in the last 72 hours. CBG: Recent Labs  Lab 04/15/18 1606 04/15/18 2019 04/15/18 2359 04/16/18 0422 04/16/18 0739  GLUCAP 141* 122* 129* 122* 123*   Lipid Profile: No results for input(s): CHOL, HDL, LDLCALC, TRIG, CHOLHDL, LDLDIRECT in the last 72 hours. Thyroid Function Tests: No results for input(s): TSH, T4TOTAL, FREET4, T3FREE, THYROIDAB in the last 72 hours. Anemia Panel: No results for input(s): VITAMINB12, FOLATE, FERRITIN, TIBC, IRON, RETICCTPCT in the last 72 hours. Sepsis Labs: No results for input(s): PROCALCITON, LATICACIDVEN in the last 168 hours.  Recent Results (from the past 240 hour(s))  Culture, blood (routine x  2) Call MD if unable to obtain prior to antibiotics being given     Status: None (Preliminary result)   Collection Time: 04/17/2018 11:21 PM  Result Value Ref Range Status   Specimen Description   Final    BLOOD LEFT FOREARM Performed at [facility], [address], [place], [place] [address]    Special Requests   Final    BOTTLES DRAWN AEROBIC ONLY Blood Culture results may not be optimal due to an inadequate volume of blood received in culture bottles Performed at [facility], [address], [place], [place] [address]    Culture   Final    NO GROWTH 2 DAYS Performed at [facility], [address] [place], [place] [address]    Report Status PENDING   Incomplete  Culture, blood (routine x 2) Call MD if unable to obtain prior to antibiotics being given     Status: None (Preliminary result)   Collection Time: 04/05/2018 11:21 PM  Result Value Ref Range Status   Specimen Description   Final    BLOOD LEFT HAND Performed at [facility], [address], [place], [place] [address]    Special Requests   Final    BOTTLES DRAWN AEROBIC ONLY Blood Culture adequate volume Performed at [facility], [address], [place], [place] [address]    Culture   Final    NO GROWTH 2 DAYS Performed at [facility], [address] [place], [place] [address]    Report Status PENDING  Incomplete  Respiratory Panel by PCR     Status: None   Collection Time: 04/14/18 12:03 AM  Result Value Ref Range Status   Adenovirus NOT DETECTED NOT DETECTED Final   Coronavirus 229E NOT DETECTED NOT DETECTED Final   Coronavirus HKU1 NOT DETECTED NOT DETECTED Final   Coronavirus NL63 NOT DETECTED NOT DETECTED Final   Coronavirus OC43 NOT DETECTED NOT DETECTED Final   Metapneumovirus NOT DETECTED NOT DETECTED Final   Rhinovirus / Enterovirus NOT DETECTED NOT DETECTED Final   Influenza A NOT DETECTED NOT DETECTED Final   Influenza B NOT DETECTED NOT DETECTED Final   Parainfluenza Virus 1 NOT DETECTED NOT DETECTED Final   Parainfluenza Virus 2 NOT DETECTED NOT DETECTED Final   Parainfluenza Virus 3 NOT DETECTED NOT DETECTED Final   Parainfluenza Virus 4 NOT DETECTED NOT DETECTED Final   Respiratory Syncytial Virus NOT DETECTED NOT DETECTED Final   Bordetella pertussis NOT DETECTED NOT DETECTED Final   Chlamydophila pneumoniae NOT DETECTED NOT DETECTED Final   Mycoplasma pneumoniae NOT DETECTED NOT DETECTED Final    Comment: Performed at [facility], [address], [place], [place] [address]         Radiology Studies: US Renal  Result Date: 04/15/2018 CLINICAL DATA:  Acute kidney injury EXAM: RENAL / URINARY  TRACT ULTRASOUND COMPLETE COMPARISON:  03/04/2009 abdominal CT FINDINGS: Right Kidney: Length: 10.2 cm. Echogenic cortex without cortical thinning. Four simple appearing cysts measuring up to 2.8 cm. No hydronephrosis or solid mass. Left Kidney: Length: 9.5 cm.  Echogenic cortex.  No hydronephrosis or mass. Bladder: Probable pelvic floor laxity.  No abnormal finding at the bladder. IMPRESSION: 1. No hydronephrosis or other acute finding. 2. Medical renal disease without atrophy. Electronically Signed   By: Marnee Spring M.D.   On: 04/15/2018 10:08   Nm Pulmonary Perf And Vent  Result Date: 04/14/2018 CLINICAL DATA:  Shortness of breath  and cough. Evaluate for pulmonary embolism. EXAM: NUCLEAR MEDICINE VENTILATION - PERFUSION LUNG SCAN TECHNIQUE: Ventilation images were obtained in multiple projections using inhaled aerosol Tc-54m DTPA. Perfusion images were obtained in multiple projections after intravenous injection of Tc-92m-MAA. RADIOPHARMACEUTICALS:  31 mCi of Tc-72m DTPA aerosol inhalation and 4 mCi Tc62m-MAA IV COMPARISON:  Chest radiograph-04/07/2018; 08/04/2016; 04/21/2016 FINDINGS: Review of chest radiograph performed 04/18/2018 demonstrates grossly unchanged enlarged cardiac silhouette and mediastinal contours with atherosclerotic plaque when the thoracic aorta. Mild cephalization of flow without frank evidence of edema. Mild diffuse slightly nodular thickening of the pulmonary interstitium. Minimal pleuroparenchymal thickening about the right minor fissure. No definite pleural effusion. No pneumothorax. Ventilation: There is minimal clumping of inhaled radiotracer about the bilateral pulmonary hila. Ingested radiotracer is seen within the mouth and hypopharynx. Perfusion: There is relative homogeneous distribution of injected radiotracer throughout the bilateral pulmonary parenchyma. No discrete mismatched filling defects to suggest pulmonary embolism. IMPRESSION: Pulmonary embolism absent (very low  probability of pulmonary embolism). Electronically Signed   By: Simonne Come M.D.   On: 04/14/2018 17:50        Scheduled Meds: . arformoterol  15 mcg Nebulization BID  . budesonide (PULMICORT) nebulizer solution  0.5 mg Nebulization BID  . guaiFENesin  600 mg Oral BID  . heparin injection (subcutaneous)  5,000 Units Subcutaneous Q8H  . ipratropium-albuterol  3 mL Nebulization BID  . leflunomide  10 mg Oral Daily  . polyethylene glycol  17 g Oral Daily  . verapamil  240 mg Oral QHS   Continuous Infusions:    LOS: 3 days    Time spent: 25 mins.More than 50% of that time was spent in counseling and/or coordination of care.      Burnadette Pop, MD [facility] Pager [phone]  If 7PM-7AM, please contact night-coverage [email or web address] Password [facility] 04/16/2018, 11:46 AM

## 2018-04-16 NOTE — Progress Notes (Signed)
Pt refused AM lab draw. Also refused 0600 dose of solumedrol, she is convinced this is what caused her to have n/v yesterday morning. Mick Sell RN

## 2018-04-16 NOTE — Clinical Social Work Note (Signed)
Clinical Social Work Assessment  Patient Details  Name: [name] MRN: [id number] Date of Birth: 09/26/1934  Date of referral:  04/16/18               Reason for consult:  Discharge Planning                Permission sought to share information with:    Permission granted to share information::     Name::        Agency::     Relationship::     Contact Information:     Housing/Transportation Living arrangements for the past 2 months:  Single Family Home Source of Information:  Patient Patient Interpreter Needed:  None Criminal Activity/Legal Involvement Pertinent to Current Situation/Hospitalization:  No - Comment as needed Significant Relationships:  Other Family Members Lives with:  Self Do you feel safe going back to the place where you live?  Yes Need for family participation in patient care:  No (Coment)  Care giving concerns:  Pt admitted from home where she resides alone. Husband passed away 2 weeks ago and niece and her family who live nearby check on and assist pt. Uses home O2 and has walker and wheelchair for ambulating at home. States she has bedside commode and shower chair which her husband was using available but has not needed them herself. Admitted for respiratory failure (has COPD), AKI, suspected pneumonia  Social Worker assessment / plan:  CSW consulted to assist with disposition as SNF is recommended. Met with pt at bedside- she was alert, oriented, and welcoming of CSW involvement. Discussed at length the recommendation for SNF at DC. Pt reports "I know that would be what is probably best for me but I really cannot see myself going anywhere but home. I can get aides and home health again and have my niece help me. My husband just died and I feel like I need to be at my home. I am coping okay but don't want to be at a nursing home." Pt states, "I have rheumatoid arthritis and I'm in pain from that right now. When I'm back home and taking my medications my  pain will be better and I will be able to walk and take care of myself better."  Pt spoke with her niece on the phone during assessment as well.  Discussed pt's wishes with CM and will continue to assess if pt returning home is feasible given care needs.  Plan: TBD- pt declining SNF at this time and prefers to return home and engage with home health.  Plan:  Employment status:  Retired Lawyer) PT Recommendations:  [place] / Referral to community resources:     Patient/Family's Response to care:  Very appreciative  Patient/Family's Understanding of and Emotional Response to Diagnosis, Current Treatment, and Prognosis:  See above- pt shows good insight and self awareness in describing recommendations versus what she desires for planning at DC and acknowledging the reasons she does not want to pursue SNF.  Emotional Assessment Appearance:  Appears stated age Attitude/Demeanor/Rapport:  Engaged, Self-Confident Affect (typically observed):  Adaptable, Appropriate Orientation:  Oriented to Self, Oriented to Place, Oriented to  Time, Oriented to Situation Alcohol / Substance use:  Not Applicable Psych involvement (Current and /or in the community):  No (Comment)  Discharge Needs  Concerns to be addressed:  Discharge Planning Concerns, Decision making concerns Readmission within the last 30 days:  No Current discharge risk:  Lives alone Barriers to Discharge:  Continued Medical Work up   [employer], LCSW 04/16/2018, 12:11 PM  [phone]

## 2018-04-16 NOTE — Progress Notes (Signed)
Report called to RN on [facility]. [facility] picked pt up for transport to [facility].

## 2018-04-17 ENCOUNTER — Inpatient Hospital Stay (HOSPITAL_COMMUNITY): Payer: Medicare HMO

## 2018-04-17 DIAGNOSIS — I10 Essential (primary) hypertension: Secondary | ICD-10-CM

## 2018-04-17 DIAGNOSIS — N186 End stage renal disease: Secondary | ICD-10-CM

## 2018-04-17 DIAGNOSIS — Z01810 Encounter for preprocedural cardiovascular examination: Secondary | ICD-10-CM

## 2018-04-17 DIAGNOSIS — J449 Chronic obstructive pulmonary disease, unspecified: Secondary | ICD-10-CM

## 2018-04-17 DIAGNOSIS — N185 Chronic kidney disease, stage 5: Secondary | ICD-10-CM

## 2018-04-17 LAB — GLUCOSE, CAPILLARY
GLUCOSE-CAPILLARY: 98 mg/dL (ref 70–99)
GLUCOSE-CAPILLARY: 114 mg/dL — AB (ref 70–99)
GLUCOSE-CAPILLARY: 114 mg/dL — AB (ref 70–99)
Glucose-Capillary: 100 mg/dL — ABNORMAL HIGH (ref 70–99)
Glucose-Capillary: 86 mg/dL (ref 70–99)

## 2018-04-17 MED ORDER — KETOROLAC TROMETHAMINE 15 MG/ML IJ SOLN
INTRAMUSCULAR | Status: AC
  Filled 2018-04-17: qty 1

## 2018-04-17 MED ORDER — KETOROLAC TROMETHAMINE 15 MG/ML IJ SOLN
15.0000 mg | Freq: Once | INTRAMUSCULAR | Status: AC
  Administered 2018-04-17: 15 mg via INTRAVENOUS

## 2018-04-17 NOTE — Progress Notes (Signed)
Upper extremity vein mapping completed:       Blanch Media 04/17/2018 2:50 PM

## 2018-04-17 NOTE — Consult Note (Signed)
Referring Physician: Dr Arlean Hopping Patient name: [name] MRN: [id number] DOB: [birth year] Sex: female  REASON FOR CONSULT: dialysis access  HPI: [name] is a 83 y.o. female, with ESRD now needing hemodialysis.  She is right handed.  She states she has been dizzy lately.  She was transferred from [facility] last evening with plan to initiate dialysis at [facility].  She is completely alert and oriented but states no one is communicating with her regarding her care. Other medical problems include COPD home O2, hypertension both stable.   Past Medical History:  Diagnosis Date  . Arthritis   . COPD (chronic obstructive pulmonary disease) (HCC)   . Dysphagia   . Emphysema   . GERD (gastroesophageal reflux disease)   . Hypertension   . Insomnia   . Interstitial lung disease (HCC)   . Kidney stone   . Neuropathy   . On home O2    2L N/C  . Osteopenia   . Pancreatitis   . Psoriasis   . Psoriasis   . Pthirus inguinalis   . Renal disorder   . Rheumatoid arthritis(714.0)   . Vertigo   . Weight loss    Past Surgical History:  Procedure Laterality Date  . CHOLECYSTECTOMY    . VESICOVAGINAL FISTULA CLOSURE W/ TAH  1962    Family History  Problem Relation Age of Onset  . Heart disease Sister   . Colon cancer Sister   . Asthma Sister     SOCIAL HISTORY: Social History   Socioeconomic History  . Marital status: Married    Spouse name: Not on file  . Number of children: Not on file  . Years of education: Not on file  . Highest education level: Not on file  Occupational History  . Not on file  Social Needs  . Financial resource strain: Not on file  . Food insecurity:    Worry: Not on file    Inability: Not on file  . Transportation needs:    Medical: Not on file    Non-medical: Not on file  Tobacco Use  . Smoking status: Former Smoker    Packs/day: 0.50    Years: 50.00    Pack years: 25.00    Types: Cigarettes    Last attempt to quit: 08/01/2000      Years since quitting: 17.7  . Smokeless tobacco: Never Used  Substance and Sexual Activity  . Alcohol use: No  . Drug use: No  . Sexual activity: Not on file  Lifestyle  . Physical activity:    Days per week: Not on file    Minutes per session: Not on file  . Stress: Not on file  Relationships  . Social connections:    Talks on phone: Not on file    Gets together: Not on file    Attends religious service: Not on file    Active member of club or organization: Not on file    Attends meetings of clubs or organizations: Not on file    Relationship status: Not on file  . Intimate partner violence:    Fear of current or ex partner: Not on file    Emotionally abused: Not on file    Physically abused: Not on file    Forced sexual activity: Not on file  Other Topics Concern  . Not on file  Social History Narrative  . Not on file    No Known Allergies  Current Facility-Administered Medications  Medication Dose Route Frequency Provider Last Rate Last Dose  . acetaminophen (TYLENOL) tablet 650 mg  650 mg Oral Q6H PRN Madelyn Flavors A, MD   650 mg at 04/15/18 0023   Or  . acetaminophen (TYLENOL) suppository 650 mg  650 mg Rectal Q6H PRN Madelyn Flavors A, MD      . arformoterol (BROVANA) nebulizer solution 15 mcg  15 mcg Nebulization BID Madelyn Flavors A, MD   15 mcg at 04/17/18 0747  . budesonide (PULMICORT) nebulizer solution 0.5 mg  0.5 mg Nebulization BID Madelyn Flavors A, MD   0.5 mg at 04/17/18 0747  . Chlorhexidine Gluconate Cloth 2 % PADS 6 each  6 each Topical Q0600 Delano Metz, MD   6 each at 04/17/18 0506  . guaiFENesin (MUCINEX) 12 hr tablet 600 mg  600 mg Oral BID Madelyn Flavors A, MD   600 mg at 04/16/18 2318  . heparin injection 5,000 Units  5,000 Units Subcutaneous Q8H Adhikari, Amrit, MD      . hydrALAZINE (APRESOLINE) injection 10 mg  10 mg Intravenous Q4H PRN Smith, Rondell A, MD      . ipratropium-albuterol (DUONEB) 0.5-2.5 (3) MG/3ML nebulizer solution 3 mL   3 mL Nebulization Q4H PRN Smith, Rondell A, MD      . ipratropium-albuterol (DUONEB) 0.5-2.5 (3) MG/3ML nebulizer solution 3 mL  3 mL Nebulization BID Madelyn Flavors A, MD   3 mL at 04/17/18 0747  . leflunomide (ARAVA) tablet 10 mg  10 mg Oral Daily Madelyn Flavors A, MD   10 mg at 04/17/18 0848  . MUSCLE RUB CREA   Topical PRN Burnadette Pop, MD      . ondansetron (ZOFRAN) injection 4 mg  4 mg Intravenous Q6H PRN Burnadette Pop, MD   4 mg at 04/15/18 1352  . oxyCODONE-acetaminophen (PERCOCET/ROXICET) 5-325 MG per tablet 1 tablet  1 tablet Oral Q6H PRN Burnadette Pop, MD   1 tablet at 04/17/18 0848  . phenol (CHLORASEPTIC) mouth spray 1 spray  1 spray Mouth/Throat PRN Adhikari, Amrit, MD      . polyethylene glycol (MIRALAX / GLYCOLAX) packet 17 g  17 g Oral Daily Burnadette Pop, MD   17 g at 04/15/18 1105  . technetium albumin aggregated (MAA) injection solution 4 millicurie  4 millicurie Intravenous Once PRN Loralie Champagne, MD      . technetium TC 43M diethylenetriame-pentaacetic acid (DTPA) injection 31 millicurie  31 millicurie Intravenous Once PRN Loralie Champagne, MD      . verapamil (CALAN-SR) CR tablet 240 mg  240 mg Oral QHS Smith, Rondell A, MD   240 mg at 04/16/18 2318    ROS:   General:  No weight loss, Fever, chills  HEENT: No recent headaches, no nasal bleeding, no visual changes, no sore throat  Neurologic: + dizziness, blackouts, seizures. No recent symptoms of stroke or mini- stroke. No recent episodes of slurred speech, or temporary blindness.  Cardiac: No recent episodes of chest pain/pressure, no shortness of breath at rest.  +shortness of breath with exertion.  Denies history of atrial fibrillation or irregular heartbeat  Vascular: No history of rest pain in feet.  No history of claudication.  No history of non-healing ulcer, No history of DVT   Pulmonary: + home oxygen, no productive cough, no hemoptysis,  No asthma or wheezing  Musculoskeletal:  [X]  Arthritis, [ ]   Low back pain,  [X]  Joint pain  Hematologic:No history of hypercoagulable state.  No history of easy bleeding.  No history  of anemia  Gastrointestinal: No hematochezia or melena,  No gastroesophageal reflux, no trouble swallowing  Urinary: [X]  chronic Kidney disease, [X]  on HD - [ ]  MWF or [ ]  TTHS, [ ]  Burning with urination, [ ]  Frequent urination, [ ]  Difficulty urinating;   Skin: No rashes  Psychological: No history of anxiety,  No history of depression   Physical Examination  Vitals:   04/16/18 2214 04/17/18 0422 04/17/18 0500 04/17/18 0926  BP:  (!) 164/86  (!) 158/97  Pulse:  88  97  Resp:  18  18  Temp:  98.6 F (37 C)  98 F (36.7 C)  TempSrc:    Oral  SpO2: 95% 100%  100%  Weight:   76 kg   Height:        Body mass index is 26.24 kg/m.  General:  Alert and oriented, no acute distress HEENT: Normal Neck: No bruit or JVD Pulmonary: Clear to auscultation bilaterally Cardiac: Regular Rate and Rhythm without murmur Abdomen: Soft, non-tender, non-distended, no mass, no scars Skin: No rash Extremity Pulses:  1+ radial, 2+ brachial pulses bilaterally Musculoskeletal: No deformity or edema  Neurologic: Upper and lower extremity motor 5/5 and symmetric  DATA:  CBC    Component Value Date/Time   WBC 5.9 04/15/2018 0619   RBC 4.29 04/15/2018 0619   HGB 11.1 (L) 04/15/2018 0619   HCT 35.2 (L) 04/15/2018 0619   PLT 242 04/15/2018 0619   MCV 82.1 04/15/2018 0619   MCH 25.9 (L) 04/15/2018 0619   MCHC 31.5 04/15/2018 0619   RDW 15.2 04/15/2018 0619   LYMPHSABS 1.5 08/04/2016 0027   MONOABS 0.4 08/04/2016 0027   EOSABS 0.0 08/04/2016 0027   BASOSABS 0.0 08/04/2016 0027    BMET    Component Value Date/Time   NA 142 04/16/2018 0834   K 4.3 04/16/2018 0834   CL 108 04/16/2018 0834   CO2 23 04/16/2018 0834   GLUCOSE 135 (H) 04/16/2018 0834   BUN 55 (H) 04/16/2018 0834   CREATININE 4.37 (H) 04/16/2018 0834   CALCIUM 8.5 (L) 04/16/2018 0834   GFRNONAA 9 (L)  04/16/2018 0834   GFRAA 10 (L) 04/16/2018 04/18/2018     ASSESSMENT:  Needs hemodialysis access  PLAN:  Most likely left arm access.  Needs IVs removed from left arm.  Needs vein map  Pt currently not willing to proceed until further discussions with her primary team and nephrology   04/18/2018, MD [facility] Office: [phone] Pager: [phone]

## 2018-04-17 NOTE — Progress Notes (Signed)
PROGRESS NOTE    LARI ARIAZ  FGH:[id number] DOB: 11-Jan-1935 DOA: 04/15/18 PCP: Clayborn Heron, MD    Brief Narrative:  83 year old female with past history of hypertension, COPD, on home oxygen, rheumatoid arthritis on Humira, psoriasis, GERD who presented to the emergency department with complaints of worsening shortness of breath for last 3 to 4 days.  The patient was also having productive cough.  Patient was noted to be hypoxic on presentation.  There was concern for PE on presentation and she was started on heparin drip which has been stopped.  Currently her respiratory status is slowly improving.  Her new issue right now is acute kidney injury on CKD.  Assessment & Plan:   Principal Problem:   COPD exacerbation (HCC) Active Problems:   Essential hypertension   Rheumatoid arthritis (HCC)   Acute on chronic respiratory failure with hypoxia (HCC)   Hypokalemia   Elevated troponin   Acute renal failure superimposed on chronic kidney disease (HCC)   Prolonged Q-T interval on ECG  Acute respiratory failure with hypoxia:  -suspected COPD vs chronic interstitial lung disease.   -Currently on minimal O2 support, breathing comfortably  Suspected pneumonia:  -Chest x-ray showed chronic interstitial disease and stable cardiomegaly. -No florid evidence of PNA - Antibiotics were discontinued.  COPD:  -No audible wheezing on exam -Steroids were discontinued  Possible ILD:  -suspected interstitial lung disease secondary to rheumatoid arthritis.    Suspected PE:  -VQ scan reviewed and was negative for pulmonary embolism.  Heparin drip was discontinued.  Acute kidney injury on CKD stage III:  -Baseline creatinine around 1.6.  Presented with creatinine of 4.01 on admission.   -Initially thought to be dehydrated now s/p IVF -Pt with signs of volume overload with worsening renal function -Nephrology following, initially transferred to [facility] for initiating HD, however  patient has since declined HD. Pt is alert and oriented and has insight to medical situation -Palliative Care consulted for goals of care  Elevated troponin:  -Denies any chest pain at present.   -Suspect mildly elevated troponin related to worsening renal function  Prolonged QT interval:  -Noted to have prolonged QTC of 583 on presentation  Rheumatoid arthritis:  -Patient is continued on immunosuppressive therapy.   -Continued on Humira at home.   -Continued on Genia Del with rheumatology outpatient.  Diastolic CHF:  -Last echocardiogram showed ejection fraction 55 to 60%.   -Continue on Lasix at home.   -Currently she is not in acute heart failure.  Her bilateral lower extremity edema has improved today.  Essential hypertension:  -BP stable at present  Hypokalemia:  -Electrolytes stable, labs reviewed  Lower extremity edema:    -Duplex ultrasound of the bilateral lower extremity negative for  DVT, reviewed  Hypoglycemia:  -Glucose noted to be in the range of 60s on presentation.   -glucose trends reviewed. Stable --Continue SSI coverage.  Deconditioning/debility:  -Patient evaluated by physical therapy and recommended skilled nursing facility on discharge.  -SW following  DVT prophylaxis: Heparin subQ Code Status: Full Family Communication: Pt in room, family at bedside Disposition Plan: SNF, timing uncertain   Consultants:   Nephrology  Procedures:     Antimicrobials: Anti-infectives (From admission, onward)   Start     Dose/Rate Route Frequency Ordered Stop   04-15-2018 2330  ceFEPIme (MAXIPIME) 1 g in sodium chloride 0.9 % 100 mL IVPB  Status:  Discontinued     1 g 200 mL/hr over 30 Minutes Intravenous Daily at  bedtime 04/16/2018 2308 04/15/18 1116       Subjective: Declines dialysis today  Objective: Vitals:   04/16/18 2214 04/17/18 0422 04/17/18 0500 04/17/18 0926  BP:  (!) 164/86  (!) 158/97  Pulse:  88  97  Resp:  18  18    Temp:  98.6 F (37 C)  98 F (36.7 C)  TempSrc:    Oral  SpO2: 95% 100%  100%  Weight:   76 kg   Height:        Intake/Output Summary (Last 24 hours) at 04/17/2018 1601 Last data filed at 04/17/2018 1452 Gross per 24 hour  Intake 360 ml  Output 200 ml  Net 160 ml   Filed Weights   04/16/18 0500 04/16/18 2036 04/17/18 0500  Weight: 76 kg 76 kg 76 kg    Examination: .General exam: Awake, laying in bed, in nad Respiratory system: Normal respiratory effort, no wheezing Cardiovascular system: regular rate, s1, s2 Gastrointestinal system: Soft, nondistended, positive BS Central nervous system: CN2-12 grossly intact, strength intact Extremities: Perfused, no clubbing Skin: Normal skin turgor, no notable skin lesions seen Psychiatry: Mood normal // no visual hallucinations   Data Reviewed: I have personally reviewed following labs and imaging studies  CBC: Recent Labs  Lab 04/20/2018 1828 04/15/18 0619  WBC 4.5 5.9  HGB 10.9* 11.1*  HCT 36.2 35.2*  MCV 84.0 82.1  PLT 190 242   Basic Metabolic Panel: Recent Labs  Lab 04/19/2018 1828 04/15/18 0619 04/16/18 0834  NA 143 140 142  K 3.1* 3.8 4.3  CL 106 108 108  CO2 26 19* 23  GLUCOSE 75 206* 135*  BUN 30* 42* 55*  CREATININE 4.01* 4.05* 4.37*  CALCIUM 8.4* 8.4* 8.5*   GFR: Estimated Creatinine Clearance: 10.9 mL/min (A) (by C-G formula based on SCr of 4.37 mg/dL (H)). Liver Function Tests: No results for input(s): AST, ALT, ALKPHOS, BILITOT, PROT, ALBUMIN in the last 168 hours. No results for input(s): LIPASE, AMYLASE in the last 168 hours. No results for input(s): AMMONIA in the last 168 hours. Coagulation Profile: Recent Labs  Lab 26-Apr-2018 2131  INR 1.04   Cardiac Enzymes: Recent Labs  Lab 04/22/2018 1828 04/14/18 0001 04/15/18 0619  TROPONINI 0.04* 0.04* 0.03*   BNP (last 3 results) No results for input(s): PROBNP in the last 8760 hours. HbA1C: No results for input(s): HGBA1C in the last 72  hours. CBG: Recent Labs  Lab 04/16/18 1607 04/16/18 2036 04/17/18 0005 04/17/18 0422 04/17/18 1137  GLUCAP 117* 99 100* 86 98   Lipid Profile: No results for input(s): CHOL, HDL, LDLCALC, TRIG, CHOLHDL, LDLDIRECT in the last 72 hours. Thyroid Function Tests: No results for input(s): TSH, T4TOTAL, FREET4, T3FREE, THYROIDAB in the last 72 hours. Anemia Panel: No results for input(s): VITAMINB12, FOLATE, FERRITIN, TIBC, IRON, RETICCTPCT in the last 72 hours. Sepsis Labs: No results for input(s): PROCALCITON, LATICACIDVEN in the last 168 hours.  Recent Results (from the past 240 hour(s))  Culture, blood (routine x 2) Call MD if unable to obtain prior to antibiotics being given     Status: None (Preliminary result)   Collection Time: 26-Apr-2018 11:21 PM  Result Value Ref Range Status   Specimen Description   Final    BLOOD LEFT FOREARM Performed at [facility], [address], [place], [place] [address]    Special Requests   Final    BOTTLES DRAWN AEROBIC ONLY Blood Culture results may not be optimal due to an inadequate volume  of blood received in culture bottles Performed at [facility], [address], [place], [place] [address]    Culture   Final    NO GROWTH 3 DAYS Performed at [facility], [address], [place], [place] [address]    Report Status PENDING  Incomplete  Culture, blood (routine x 2) Call MD if unable to obtain prior to antibiotics being given     Status: None (Preliminary result)   Collection Time: 2018/05/11 11:21 PM  Result Value Ref Range Status   Specimen Description   Final    BLOOD LEFT HAND Performed at [facility], [address] [place], [place] [address]    Special Requests   Final    BOTTLES DRAWN AEROBIC ONLY Blood Culture adequate volume Performed at [facility], [address], [place], [place] [address]    Culture   Final    NO GROWTH 3 DAYS Performed at  [facility], [address], [place], [place] [address]    Report Status PENDING  Incomplete  Respiratory Panel by PCR     Status: None   Collection Time: 04/14/18 12:03 AM  Result Value Ref Range Status   Adenovirus NOT DETECTED NOT DETECTED Final   Coronavirus 229E NOT DETECTED NOT DETECTED Final   Coronavirus HKU1 NOT DETECTED NOT DETECTED Final   Coronavirus NL63 NOT DETECTED NOT DETECTED Final   Coronavirus OC43 NOT DETECTED NOT DETECTED Final   Metapneumovirus NOT DETECTED NOT DETECTED Final   Rhinovirus / Enterovirus NOT DETECTED NOT DETECTED Final   Influenza A NOT DETECTED NOT DETECTED Final   Influenza B NOT DETECTED NOT DETECTED Final   Parainfluenza Virus 1 NOT DETECTED NOT DETECTED Final   Parainfluenza Virus 2 NOT DETECTED NOT DETECTED Final   Parainfluenza Virus 3 NOT DETECTED NOT DETECTED Final   Parainfluenza Virus 4 NOT DETECTED NOT DETECTED Final   Respiratory Syncytial Virus NOT DETECTED NOT DETECTED Final   Bordetella pertussis NOT DETECTED NOT DETECTED Final   Chlamydophila pneumoniae NOT DETECTED NOT DETECTED Final   Mycoplasma pneumoniae NOT DETECTED NOT DETECTED Final    Comment: Performed at [facility], [address], [place], [place] [address]     Radiology Studies: No results found.  Scheduled Meds: . arformoterol  15 mcg Nebulization BID  . budesonide (PULMICORT) nebulizer solution  0.5 mg Nebulization BID  . Chlorhexidine Gluconate Cloth  6 each Topical Q0600  . guaiFENesin  600 mg Oral BID  . heparin injection (subcutaneous)  5,000 Units Subcutaneous Q8H  . ipratropium-albuterol  3 mL Nebulization BID  . leflunomide  10 mg Oral Daily  . polyethylene glycol  17 g Oral Daily  . verapamil  240 mg Oral QHS   Continuous Infusions:   LOS: 4 days   Rickey Barbara, MD [facility] Pager On Amion  If 7PM-7AM, please contact night-coverage 04/17/2018, 4:01 PM

## 2018-04-17 NOTE — Progress Notes (Signed)
Palliative Medicine RN Note: Rec'd call from Dr Arlean Hopping requesting PMT consult. Verbal order placed in chart.  However, due to high referral volume, there may be a delay seeing this patient. Please call the Palliative Medicine Team office at [phone] if recommendations are needed in the interim.  Thank you for inviting [place] to see this patient.  Margret Chance Lamark Schue, RN, BSN, [facility] Palliative Medicine Team 04/17/2018 10:32 AM Office [phone]

## 2018-04-17 NOTE — Progress Notes (Signed)
[facility] Progress Note  Subjective: pt today is not sure what she wants, feels she was treated badly last night and "I want to go home".  Is confused off and on.   Vitals:   04/16/18 2214 04/17/18 0422 04/17/18 0500 04/17/18 0926  BP:  (!) 164/86  (!) 158/97  Pulse:  88  97  Resp:  18  18  Temp:  98.6 F (37 C)  98 F (36.7 C)  TempSrc:    Oral  SpO2: 95% 100%  100%  Weight:   76 kg   Height:        Inpatient medications: . arformoterol  15 mcg Nebulization BID  . budesonide (PULMICORT) nebulizer solution  0.5 mg Nebulization BID  . Chlorhexidine Gluconate Cloth  6 each Topical Q0600  . guaiFENesin  600 mg Oral BID  . heparin injection (subcutaneous)  5,000 Units Subcutaneous Q8H  . ipratropium-albuterol  3 mL Nebulization BID  . leflunomide  10 mg Oral Daily  . polyethylene glycol  17 g Oral Daily  . verapamil  240 mg Oral QHS    acetaminophen **OR** acetaminophen, hydrALAZINE, ipratropium-albuterol, MUSCLE RUB, ondansetron (ZOFRAN) IV, oxyCODONE-acetaminophen, phenol, technetium albumin aggregated, technetium TC 74M diethylenetriame-pentaacetic acid  Iron/TIBC/Ferritin/ %Sat No results found for: IRON, TIBC, FERRITIN, IRONPCTSAT  Home meds:  - adalimumab 40 sq every 2wks/ leflunomide 10 qd  - triotropium bromide monohydrate 2 puffs qd/ pred 5mg  qd/ glycopyrrolate-Formoterol  2 puffs bid/ budesonide-formoterol 160-4.5 2 puffs bid/ albuterol nebs prn  - furosemide 20 mg qd prn/ ibuprofen prn/ verapamil 240 cr qd  - levofloxacine 500 qd/ azithromycin 250 ii qd for 4d  Exam: Gen pleasant, frail, no distress, nasal O2 +JVD Chest slightly rales bibasilar RRR w/ 2/6 sem no RG Abd soft ntnd no mass or ascites +bs Ext 1+ bilat LE edema Neuro is alert, Ox 3 , nf   Impression: 1. CKD stage V - pt has progressive CKD w uremia now.  As of a couple mos ago she was relatively self-sufficient.  Discussed options yesterday and pt seemed ready for dialysis,  but today pt not sure what she wants.  Have d/w patient and her niece, will ask for palliative care consult to help reach a decision about dialysis or not.     2. COPD/ ILD - no CHF on cxr, home O2 3. RA 4. Volume - +some LE edema, BP's up 5. HTN 6. Anemia - Hb 10- 11, no esa needed   Plan - as above, postpone HD plans for now   MD [facility] pager [phone]   04/17/2018, 12:34 PM   Recent Labs  Lab 17-Apr-2018 2131 04/15/18 0619 04/16/18 0834  NA  --  140 142  K  --  3.8 4.3  CL  --  108 108  CO2  --  19* 23  GLUCOSE  --  206* 135*  BUN  --  42* 55*  CREATININE  --  4.05* 4.37*  CALCIUM  --  8.4* 8.5*  INR 1.04  --   --    No results for input(s): AST, ALT, ALKPHOS, BILITOT, PROT in the last 168 hours. Recent Labs  Lab April 17, 2018 1828 04/15/18 0619  WBC 4.5 5.9  HGB 10.9* 11.1*  HCT 36.2 35.2*  MCV 84.0 82.1  PLT 190 242

## 2018-04-17 NOTE — Progress Notes (Signed)
Pt asked for pain med, now refusing to take it, wants food with but refuses to take any food made available to her. States she wants to go home. She seems to understand that she needs to have dialysis cath placed, but wants to go home.  States she's not being treated right.  Not able to get pt to give reason why. Dr Rhona Leavens paged and informed pt wanting to go home.

## 2018-04-17 NOTE — Progress Notes (Addendum)
Spouse died 2 months ago. Home alone. Has home O2. Referral placed to [facility] from [facility], patient wanting to DC, oriented but not rational. Patient likely needing clipped prior to DC, but is unsure if she wants to procede. Palliative ordered to assist with [facility] re HD, who is recommending Psychiatric consult to determine competency.

## 2018-04-18 LAB — GLUCOSE, CAPILLARY
GLUCOSE-CAPILLARY: 79 mg/dL (ref 70–99)
GLUCOSE-CAPILLARY: 85 mg/dL (ref 70–99)
Glucose-Capillary: 106 mg/dL — ABNORMAL HIGH (ref 70–99)
Glucose-Capillary: 87 mg/dL (ref 70–99)
Glucose-Capillary: 94 mg/dL (ref 70–99)

## 2018-04-18 MED ORDER — GUAIFENESIN ER 600 MG PO TB12: 600.0000 mg | ORAL_TABLET | Freq: Two times a day (BID) | ORAL | Status: AC

## 2018-04-18 NOTE — Progress Notes (Signed)
Called security for patient's son disruptive behavior with the patient and staff. Son is very paranoid. "She is not committed." Explained to son that she is getting discharge but patient and him does not have the key to her house. [facility] is unable to transport patient. " You need to give her sedative." " I am a veteran and you need to respect me." Son in confrontational to Clinical research associate. Told him we are calling security and he took off the elevator.

## 2018-04-18 NOTE — Progress Notes (Signed)
Patient discharged to home with son  After visit Summary reviewed. Patient capable of reverbalizing medications and follow up visits. No signs and symptoms of distress noted. Patient educated to return to the ED in the case of an emergency. MD is paged waiting for reply about oxygen for patient. Sallye Ober patient's niece states that she is not comfortable coming to get her aunt because patient's son states that she Sallye Ober was going to  to harm his mother. RN awaiting order from MD. Norman Clay RN

## 2018-04-18 NOTE — Progress Notes (Signed)
PT Cancellation Note  Patient Details Name: [name] MRN: [id number] DOB: 1935-03-24   Cancelled Treatment:    Reason Eval/Treat Not Completed: Other (comment)   Attempted to work with Ms. Gaynell Face, however Dr. Linna Darner with Palliative Care requested we hold at this time, as pt is currently restless/agitated (see Dr. Beatriz Stallion note);   Will reattempt PT as able;   Van Clines, PT  Acute Rehabilitation Services Pager [phone] Office [phone]    Levi Aland 04/18/2018, 11:40 AM

## 2018-04-18 NOTE — Clinical Social Work Note (Signed)
CSW received consult for residential hospice placement as patient was transitioned to comfort care. CSW advised that patient's niece chose [facility] hospice. Call made to Webb Silversmith, community nurse liaison with [facility] and referral made. CSW later informed that they can accept patient and would be meeting with niece, Nadara Mustard and patient to completed paperwork. Per Carmel Sacramento, Ms. Bowens had advised her that she would not be completing the admissions paperwork for patient.   Ms. Merrilyn Puma met with patient and niece this afternoon and patient has refused to go to [facility] and wants to go home. During her visit, when asked to contact her son, patient refused and also would give the phone number to Ms. Merrilyn Puma. Ms. Jeneen Rinks would also not provide this information, because of patient's adamant refusal to give his name or phone number. Cheri was advised by niece that the doctor was going to request psych to evaluate patient for capacity and this information was shared with CSW. Cheri advised CSW that she will continue to follow, however there is nothing she can do if patient refuses.  CSW contacted MD to provide update and inquire about psych eval. Per Dr. Rodena Piety psych will no longer evaluate for capacity and she indicated that patient has capacity to make her own decisions. CSW talked with Ms. Bowens after she left the room and she shared that Ms. Even's son has psychiatric issues (she does not know the diagnosis) and is on medication. CSW informed that patient contacted her son and he is supposed to be coming to the hospital and per Ms. Bowens, this is why she was leaving as son has accused her of not taking care of his mother and other things. Charge nurse and patient's nurse updated. CSW will continue to follow, provide SW intervention services as needed and assist if needed with patient's discharge.  Ragnar Waas Givens, MSW, LCSW Licensed Clinical Social Worker [place] [phone]

## 2018-04-18 NOTE — Progress Notes (Signed)
Pt currently weighing decision on whether to proceed with HD.  Vein map complete.  Could get left arm AVF is decides to proceed.  Will follow for now.  Fabienne Bruns, MD [facility] Office: [phone] Pager: [phone]

## 2018-04-18 NOTE — Progress Notes (Signed)
[facility] Progress Note  Subjective: patient no new c/o's, apprecaite pall care input.   Vitals:   04/17/18 2115 04/18/18 0425 04/18/18 0744 04/18/18 0852  BP:  (!) 160/89 (!) 162/83   Pulse:  83 87   Resp:  18 18   Temp:  98.4 F (36.9 C) 98.3 F (36.8 C)   TempSrc:   Oral   SpO2: 94% 96% (!) 86% 92%  Weight:      Height:        Inpatient medications: . arformoterol  15 mcg Nebulization BID  . budesonide (PULMICORT) nebulizer solution  0.5 mg Nebulization BID  . guaiFENesin  600 mg Oral BID  . heparin injection (subcutaneous)  5,000 Units Subcutaneous Q8H  . ipratropium-albuterol  3 mL Nebulization BID  . leflunomide  10 mg Oral Daily  . polyethylene glycol  17 g Oral Daily  . verapamil  240 mg Oral QHS    acetaminophen **OR** acetaminophen, hydrALAZINE, ipratropium-albuterol, MUSCLE RUB, ondansetron (ZOFRAN) IV, oxyCODONE-acetaminophen, phenol, technetium albumin aggregated, technetium TC 35M diethylenetriame-pentaacetic acid  Iron/TIBC/Ferritin/ %Sat No results found for: IRON, TIBC, FERRITIN, IRONPCTSAT  Exam: Gen pleasant, frail, no distress, nasal O2 +JVD Chest slightly rales bibasilar RRR w/ 2/6 sem no RG Abd soft ntnd no mass or ascites +bs Ext 1+ bilat LE edema Neuro is alert, Ox 3 , nf   Impression: 1. CKD stage V - w/ uremia/ confusion.  Pt doesn't want to stay in hospital and confused. Not a good HD candidate overall due to comorbids. Have d/w niece and palliative care team, plan will be to transition over to hospice, likely residential hospice.      2. COPD/ ILD - no CHF on cxr, home O2 3. RA 4. Volume - +some LE edema, BP's up 5. HTN 6. Anemia - Hb 10- 11, no esa needed   Plan - as above, will sign off   Vinson Moselle MD [facility] pager [phone]   04/18/2018, 12:37 PM   Recent Labs  Lab 04/14/2018 2131 04/15/18 0619 04/16/18 0834  NA  --  140 142  K  --  3.8 4.3  CL  --  108 108  CO2  --  19* 23   GLUCOSE  --  206* 135*  BUN  --  42* 55*  CREATININE  --  4.05* 4.37*  CALCIUM  --  8.4* 8.5*  INR 1.04  --   --    No results for input(s): AST, ALT, ALKPHOS, BILITOT, PROT in the last 168 hours. Recent Labs  Lab 04/18/2018 1828 04/15/18 0619  WBC 4.5 5.9  HGB 10.9* 11.1*  HCT 36.2 35.2*  MCV 84.0 82.1  PLT 190 242

## 2018-04-18 NOTE — Progress Notes (Addendum)
PROGRESS NOTE    [name]  JJH:[id number] DOB: June 19, 1935 DOA: 04/14/2018 PCP: Clayborn Heron, MD  Brief Narrative: 83 year old female with past history of hypertension, COPD, on home oxygen, rheumatoid arthritis on Humira, psoriasis, GERD who presented to the emergency department with complaints of worsening shortness of breath for last 3 to 4 days. The patient was also having productive cough. Patient was noted to be hypoxic on presentation. There was concern for PE on presentation and she was started on heparin drip which has been stopped. Currently her respiratory status is slowly improving. Her new issue right now is acute kidney injury on CKD Assessment & Plan:   Principal Problem:   COPD exacerbation (HCC) Active Problems:   Essential hypertension   Rheumatoid arthritis (HCC)   Acute on chronic respiratory failure with hypoxia (HCC)   Hypokalemia   Elevated troponin   Acute renal failure superimposed on chronic kidney disease (HCC)   Prolonged Q-T interval on ECG  Acute respiratory failure with hypoxia:  -suspected COPD vs chronic interstitial lung disease vs Pneumonia -Currently on minimal O2 support, breathing comfortable  Suspected PE: -VQ scan reviewed and was negative for pulmonary embolism. Heparin drip was discontinued.  Acute kidney injury on CKD stage III:  -Baseline creatinine around 1.6. Presented with creatinine of 4.01 on admission.  -Initially thought to be dehydrated now s/p IVF -Pt with signs of volume overload with worsening renal function -Nephrology following, initially transferred to [facility] for initiating HD, however patient has since declined HD. Pt is alert and oriented and has insight to medical situation -Palliative Care consulted for goals of care -Appreciate palliative care input.  Patient is very clear in telling me that she just wants to go home.  And she does not want dialysis.  Her CODE STATUS is changed to DO NOT RESUSCITATE.   Will consult social worker for discharge plans with residential hospice.  Rheumatoid arthritis:  -Patient is continued on immunosuppressive therapy.  -Continued on Humira at home.  -Continued on Arava.   Diastolic CHF:  -Last echocardiogram showed ejection fraction 55 to 60%.  -Continue on Lasix at home. -Currently she is not in acute heart failure. Her bilateral lower extremity edema has improved today.  Essential hypertension:  -BP stable at present  Hypokalemia:  -Electrolytes stable, labs reviewed  Lower extremity edema:  -Duplex ultrasound of the bilateral lower extremity negative for DVT, reviewed  Hypoglycemia:  -Glucose noted to be in the range of 60s on presentation.  -glucose trends reviewed. Stable --Continue SSI coverage.  Deconditioning/debility:  -Patient evaluated by physical therapy and recommended skilled nursing facility on discharge.  -SW following  DVT prophylaxis: Heparin Code Status DO NOT RESUSCITATE Family Communication: Discussed with niece Disposition Plan hoping to discharge her with residential hospice tomorrow 04/19/2018. Consultants: Nephrology, palliative care Subjective: Is awake alert resting in bed requesting to go home feels her breathing is back to baseline.  Objective: Vitals:   04/17/18 2115 04/18/18 0425 04/18/18 0744 04/18/18 0852  BP:  (!) 160/89 (!) 162/83   Pulse:  83 87   Resp:  18 18   Temp:  98.4 F (36.9 C) 98.3 F (36.8 C)   TempSrc:   Oral   SpO2: 94% 96% (!) 86% 92%  Weight:      Height:        Intake/Output Summary (Last 24 hours) at 04/18/2018 1245 Last data filed at 04/18/2018 1001 Gross per 24 hour  Intake 360 ml  Output 200 ml  Net  160 ml   Filed Weights   04/16/18 0500 04/16/18 2036 04/17/18 0500  Weight: 76 kg 76 kg 76 kg    Examination:  General exam: Appears calm and comfortable  Respiratory system: Few rhonchi to auscultation. Respiratory effort normal. Cardiovascular  system: S1 & S2 heard, RRR. No JVD, murmurs, rubs, gallops or clicks. No pedal edema. Gastrointestinal system: Abdomen is nondistended, soft and nontender. No organomegaly or masses felt. Normal bowel sounds heard. Central nervous system: Alert and oriented. No focal neurological deficits. Extremities: 2 plus edema Skin: No rashes, lesions or ulcers Psychiatry: Judgement and insight appear normal. Mood & affect appropriate.     Data Reviewed: I have personally reviewed following labs and imaging studies  CBC: Recent Labs  Lab 04/03/2018 1828 04/15/18 0619  WBC 4.5 5.9  HGB 10.9* 11.1*  HCT 36.2 35.2*  MCV 84.0 82.1  PLT 190 242   Basic Metabolic Panel: Recent Labs  Lab 04/11/2018 1828 04/15/18 0619 04/16/18 0834  NA 143 140 142  K 3.1* 3.8 4.3  CL 106 108 108  CO2 26 19* 23  GLUCOSE 75 206* 135*  BUN 30* 42* 55*  CREATININE 4.01* 4.05* 4.37*  CALCIUM 8.4* 8.4* 8.5*   GFR: Estimated Creatinine Clearance: 10.9 mL/min (A) (by C-G formula based on SCr of 4.37 mg/dL (H)). Liver Function Tests: No results for input(s): AST, ALT, ALKPHOS, BILITOT, PROT, ALBUMIN in the last 168 hours. No results for input(s): LIPASE, AMYLASE in the last 168 hours. No results for input(s): AMMONIA in the last 168 hours. Coagulation Profile: Recent Labs  Lab 04/27/2018 2131  INR 1.04   Cardiac Enzymes: Recent Labs  Lab 04/07/2018 1828 04/14/18 0001 04/15/18 0619  TROPONINI 0.04* 0.04* 0.03*   BNP (last 3 results) No results for input(s): PROBNP in the last 8760 hours. HbA1C: No results for input(s): HGBA1C in the last 72 hours. CBG: Recent Labs  Lab 04/17/18 2017 04/18/18 0008 04/18/18 0424 04/18/18 0741 04/18/18 1116  GLUCAP 114* 106* 79 94 85   Lipid Profile: No results for input(s): CHOL, HDL, LDLCALC, TRIG, CHOLHDL, LDLDIRECT in the last 72 hours. Thyroid Function Tests: No results for input(s): TSH, T4TOTAL, FREET4, T3FREE, THYROIDAB in the last 72 hours. Anemia  Panel: No results for input(s): VITAMINB12, FOLATE, FERRITIN, TIBC, IRON, RETICCTPCT in the last 72 hours. Sepsis Labs: No results for input(s): PROCALCITON, LATICACIDVEN in the last 168 hours.  Recent Results (from the past 240 hour(s))  Culture, blood (routine x 2) Call MD if unable to obtain prior to antibiotics being given     Status: None (Preliminary result)   Collection Time: 04/11/2018 11:21 PM  Result Value Ref Range Status   Specimen Description   Final    BLOOD LEFT FOREARM Performed at [facility], [address] [place], [place] [address]    Special Requests   Final    BOTTLES DRAWN AEROBIC ONLY Blood Culture results may not be optimal due to an inadequate volume of blood received in culture bottles Performed at [facility], [address], [place], [place] [address]    Culture   Final    NO GROWTH 4 DAYS Performed at [facility], [address], [place], [place] [address]    Report Status PENDING  Incomplete  Culture, blood (routine x 2) Call MD if unable to obtain prior to antibiotics being given     Status: None (Preliminary result)   Collection Time: 04/07/2018 11:21 PM  Result Value  Ref Range Status   Specimen Description   Final    BLOOD LEFT HAND Performed at [facility], [address] [place], [place] [address]    Special Requests   Final    BOTTLES DRAWN AEROBIC ONLY Blood Culture adequate volume Performed at [facility], [address], [place], [place] [address]    Culture   Final    NO GROWTH 4 DAYS Performed at [facility], [address], [place], [place] [address]    Report Status PENDING  Incomplete  Respiratory Panel by PCR     Status: None   Collection Time: 04/14/18 12:03 AM  Result Value Ref Range Status   Adenovirus NOT DETECTED NOT DETECTED Final   Coronavirus 229E NOT DETECTED NOT DETECTED Final   Coronavirus HKU1 NOT DETECTED NOT DETECTED Final    Coronavirus NL63 NOT DETECTED NOT DETECTED Final   Coronavirus OC43 NOT DETECTED NOT DETECTED Final   Metapneumovirus NOT DETECTED NOT DETECTED Final   Rhinovirus / Enterovirus NOT DETECTED NOT DETECTED Final   Influenza A NOT DETECTED NOT DETECTED Final   Influenza B NOT DETECTED NOT DETECTED Final   Parainfluenza Virus 1 NOT DETECTED NOT DETECTED Final   Parainfluenza Virus 2 NOT DETECTED NOT DETECTED Final   Parainfluenza Virus 3 NOT DETECTED NOT DETECTED Final   Parainfluenza Virus 4 NOT DETECTED NOT DETECTED Final   Respiratory Syncytial Virus NOT DETECTED NOT DETECTED Final   Bordetella pertussis NOT DETECTED NOT DETECTED Final   Chlamydophila pneumoniae NOT DETECTED NOT DETECTED Final   Mycoplasma pneumoniae NOT DETECTED NOT DETECTED Final    Comment: Performed at [facility], [address], [place], [place] [address]         Radiology Studies: No results found.      Scheduled Meds: . arformoterol  15 mcg Nebulization BID  . budesonide (PULMICORT) nebulizer solution  0.5 mg Nebulization BID  . guaiFENesin  600 mg Oral BID  . heparin injection (subcutaneous)  5,000 Units Subcutaneous Q8H  . ipratropium-albuterol  3 mL Nebulization BID  . leflunomide  10 mg Oral Daily  . polyethylene glycol  17 g Oral Daily  . verapamil  240 mg Oral QHS   Continuous Infusions:   LOS: 5 days     Alwyn Ren, MD [facility]  If 7PM-7AM, please contact night-coverage [email or web address] Password [facility] 04/18/2018, 12:45 PM

## 2018-04-18 NOTE — Consult Note (Signed)
Consultation Note Date: 04/18/2018   Patient Name: [name]  DOB: 03/18/1935  MRN: [id number]  Age / Sex: 83 y.o., female  PCP: Brianna Barthel Fanny Dance, MD Referring Physician: Alwyn Ren, MD  Reason for Consultation: Establishing goals of care  HPI/Patient Profile: 83 y.o. female admitted on 04/29/2018   Clinical Assessment and Goals of Care:  [name] is a 83 year old lady who lives at home by herself. She is noted to have past medical history significant for hypertension, chronic obstructive pulmonary disease, she is on home oxygen, also has history of rheumatoid arthritis and is on Humira injections, history of psoriasis gastroesophageal reflux disease.  Patient has been admitted to the hospital with shortness of breath, productive cough. Initially admitted for chronic obstructive pulmonary disease exacerbation. Hospital course complicated by acute kidney injury on top of underlying chronic kidney disease to the point that the patient is a deemed it now to have developed uremia with progressive chronic kidney disease. Patient reportedly was to be considered for initiation of dialysis however she has been declining access placement, declining dialysis.  A palliative consultation has thus been requested.  Patient is an elderly lady resting in bed. She is able to state her name. She is able to state her full address. She states that she simply wants to go home. When asked why she was brought to the hospital she states that she was having problems with her breathing. She complains of pain in her foot. She states she has been made aware of her acute on chronic kidney disease. She states that she has been given information about dialysis.  I discussed gently with the patient about her goals wishes in values. I introduced myself and palliative care as follows: Palliative medicine is specialized  medical care for people living with serious illness. It focuses on providing relief from the symptoms and stress of a serious illness. The goal is to improve quality of life for both the patient and the family.  The patient states that she simply would like for people to stop thinking that she is crazy. She states she has been through too much. She states she simply wants to go home.  I gave her information about dialysis. Indications for dialysis discussed. Methods by which access can be established also discussed with her. I tried to explain to her as best as I could about pros and cons. Patient simply continues to desire going home.  Call placed and discussed with niece [name] at [phone]. [name] inform me that the patient's husband died 2 months ago. She stated that the patient's husband took care of everything. The niece [name] stated that the patient's health has declined sharply since her husband passed away, she has not been eating well, she has been having more issues with volume overload and shortness of breath.  I discussed with [name] about the patient's current medical condition and scope of current hospitalization. [name] states that the patient was initially at [facility] and has been transferred to [facility] because she  needed dialysis. I discussed extensively with her about the nature of my conversation with the patient.  I discussed with [name] about hospice philosophy of care, if the patient continues to refuse hemodialysis, to consider home with hospice on discharge versus residential hospice. [name] states that the patient has an only child a son who is 73 years old. He is reportedly with mental health challenges, is often paranoid and does not give out his phone number. However, his phone number is stored in the patient's cell phone and [name] states that she did speak with the patient's son over the phone using the patient's cell phone on 04-17-18.  [name] states she feels  overwhelmed about being the primary decision maker for the patient when the patient has a next of kin son. She states she does not want any blame to come to her should the patient continued to decline.  I went back and requested [name] to give me her son's phone number. She became very angry and stated that something to somebody if she is not allowed to leave the hospital as soon as possible. She stated that was being held here against her will.   At this point in time, we recommend psychiatric consultation to determine capacity. Continue current mode of care for now. Palliative medicine team to continue to follow. See additional discussions/recommendations discussed below.    NEXT OF KIN  Niece [name] has been taking care of the patient and is the primary caregiver.  She reportedly also has a son [name], his contact information is unknown.  Niece [name] stated that the patient's husband died 2 months ago.    SUMMARY OF RECOMMENDATIONS    1. Patient repeatedly endorses that she doesn't want to have access established, she doesn't want to have dialysis started. How ever, when I discussed with her about Hospice philosophy of care, she simply stated that she would like to go home. Recommend a psychiatry consult to help determine capacity.  2. Should the patient be deemed to have capacity and still be noted to be refusing initiation of dialysis, then would recommend either home with hospice support with assistance from Niece or residential hospice.  Thank you for the consult, PMT to continue to follow along.   Code Status/Advance Care Planning:  Full code    Symptom Management:    as above, continue current mode of care  Palliative Prophylaxis:   Delirium Protocol  Additional Recommendations (Limitations, Scope, Preferences):  Full Scope Treatment  Psycho-social/Spiritual:   Desire for further Chaplaincy support:yes  Additional Recommendations: Caregiving   Support/Resources  Prognosis:   Unable to determine  Discharge Planning: To Be Determined      Primary Diagnoses: Present on Admission: . COPD exacerbation (HCC) . Essential hypertension . Acute on chronic respiratory failure with hypoxia (HCC) . Rheumatoid arthritis (HCC) . Hypokalemia . Elevated troponin . Acute renal failure superimposed on chronic kidney disease (HCC) . Prolonged Q-T interval on ECG   I have reviewed the medical record, interviewed the patient and family, and examined the patient. The following aspects are pertinent.  Past Medical History:  Diagnosis Date  . Arthritis   . COPD (chronic obstructive pulmonary disease) (HCC)   . Dysphagia   . Emphysema   . GERD (gastroesophageal reflux disease)   . Hypertension   . Insomnia   . Interstitial lung disease (HCC)   . Kidney stone   . Neuropathy   . On home O2    2L N/C  . Osteopenia   .  Pancreatitis   . Psoriasis   . Psoriasis   . Pthirus inguinalis   . Renal disorder   . Rheumatoid arthritis(714.0)   . Vertigo   . Weight loss    Social History   Socioeconomic History  . Marital status: Married    Spouse name: Not on file  . Number of children: Not on file  . Years of education: Not on file  . Highest education level: Not on file  Occupational History  . Not on file  Social Needs  . Financial resource strain: Not on file  . Food insecurity:    Worry: Not on file    Inability: Not on file  . Transportation needs:    Medical: Not on file    Non-medical: Not on file  Tobacco Use  . Smoking status: Former Smoker    Packs/day: 0.50    Years: 50.00    Pack years: 25.00    Types: Cigarettes    Last attempt to quit: 08/01/2000    Years since quitting: 17.7  . Smokeless tobacco: Never Used  Substance and Sexual Activity  . Alcohol use: No  . Drug use: No  . Sexual activity: Not on file  Lifestyle  . Physical activity:    Days per week: Not on file    Minutes per session: Not on file   . Stress: Not on file  Relationships  . Social connections:    Talks on phone: Not on file    Gets together: Not on file    Attends religious service: Not on file    Active member of club or organization: Not on file    Attends meetings of clubs or organizations: Not on file    Relationship status: Not on file  Other Topics Concern  . Not on file  Social History Narrative  . Not on file   Family History  Problem Relation Age of Onset  . Heart disease Sister   . Colon cancer Sister   . Asthma Sister    Scheduled Meds: . arformoterol  15 mcg Nebulization BID  . budesonide (PULMICORT) nebulizer solution  0.5 mg Nebulization BID  . Chlorhexidine Gluconate Cloth  6 each Topical Q0600  . guaiFENesin  600 mg Oral BID  . heparin injection (subcutaneous)  5,000 Units Subcutaneous Q8H  . ipratropium-albuterol  3 mL Nebulization BID  . leflunomide  10 mg Oral Daily  . polyethylene glycol  17 g Oral Daily  . verapamil  240 mg Oral QHS   Continuous Infusions: PRN Meds:.acetaminophen **OR** acetaminophen, hydrALAZINE, ipratropium-albuterol, MUSCLE RUB, ondansetron (ZOFRAN) IV, oxyCODONE-acetaminophen, phenol, technetium albumin aggregated, technetium TC 11M diethylenetriame-pentaacetic acid Medications Prior to Admission:  Prior to Admission medications   Medication Sig Start Date End Date Taking? Authorizing Provider  Adalimumab (HUMIRA) 40 MG/0.8ML PSKT Inject 40 mg into the skin every 14 (fourteen) days.   Yes [provider]  albuterol (PROVENTIL HFA;VENTOLIN HFA) 108 (90 Base) MCG/ACT inhaler Inhale 2 puffs into the lungs every 6 (six) hours as needed for wheezing or shortness of breath. 04/21/16  Yes Oretha Milch, MD  Chlorphen-Phenyleph-ASA (ALKA-SELTZER PLUS COLD) 2-7.8-325 MG TBEF Take 2 tablets by mouth every 6 (six) hours as needed. Cough and congestion   Yes [provider]  furosemide (LASIX) 40 MG tablet Take 20 mg by mouth as needed for fluid.  05/30/16   Yes [provider]  ibuprofen (ADVIL,MOTRIN) 200 MG tablet Take 400 mg by mouth every 6 (six) hours as needed  for moderate pain.   Yes [provider]  leflunomide (ARAVA) 10 MG tablet Take 10 mg by mouth daily.   Yes [provider]  levofloxacin (LEVAQUIN) 500 MG tablet Take 1 tablet (500 mg total) by mouth daily. 04/10/18  Yes Byrum, Les Pou, MD  Liniments (BEN GAY EX) Apply 1 application topically 3 (three) times daily as needed. PRN Arthritis pain    Yes [provider]  Salicylic Acid 3 % CREA Apply 1 application topically daily as needed. (Psoriasis Control Cream by Lenon Oms)- Active Ingredient Salicylic Acid 3%)    Yes [provider]  verapamil (CALAN-SR) 240 MG CR tablet Take 1 tablet (240 mg total) by mouth at bedtime. 08/21/17  Yes Wendall Stade, MD  azithromycin (ZITHROMAX) 250 MG tablet Take 2 pills today then one a day for 4 additional days Patient not taking: Reported on 2018/04/29 01/25/18   Leslye Peer, MD  budesonide-formoterol [facility]) 160-4.5 MCG/ACT inhaler Inhale 2 puffs into the lungs 2 (two) times daily. Patient not taking: Reported on April 29, 2018 09/16/16   Parrett, Virgel Bouquet, NP  Glycopyrrolate-Formoterol (BEVESPI AEROSPHERE) 9-4.8 MCG/ACT AERO Inhale 2 puffs into the lungs 2 (two) times daily. Patient not taking: Reported on 2018-04-29 10/03/17   Leslye Peer, MD  predniSONE (DELTASONE) 10 MG tablet 4 tabs x 2 days, 2 tabs x 2 days, 1 tab x 2 days Patient not taking: Reported on 04/29/2018 02/03/17   Nyoka Cowden, MD  predniSONE (DELTASONE) 5 MG tablet TAKE 1 TABLET BY MOUTH ONCE DAILY Patient not taking: Reported on 29-Apr-2018 12/20/17   Leslye Peer, MD  Tiotropium Bromide Monohydrate (SPIRIVA RESPIMAT) 2.5 MCG/ACT AERS 2 puffs once a day Patient not taking: Reported on 04-29-18 11/17/16 01/24/18  Leslye Peer, MD   No Known Allergies Review of Systems Has pain in foot  Physical Exam Weak appearing elderly lady  resting in bed S1-S2 Normal respiratory effort Awake alert, answers questions appropriately Patient has edema worse in left lower extremity Abdomen is not distended   Vital Signs: BP (!) 162/83 (BP Location: Left Arm)   Pulse 87   Temp 98.3 F (36.8 C) (Oral)   Resp 18   Ht 5\' 7"  (1.702 m)   Wt 76 kg   SpO2 92%   BMI 26.24 kg/m  Pain Scale: Faces   Pain Score: 0-No pain   SpO2: SpO2: 92 % O2 Device:SpO2: 92 % O2 Flow Rate: .O2 Flow Rate (L/min): 4 L/min  IO: Intake/output summary:   Intake/Output Summary (Last 24 hours) at 04/18/2018 1100 Last data filed at 04/18/2018 1001 Gross per 24 hour  Intake 360 ml  Output 200 ml  Net 160 ml    LBM: Last BM Date: 04/14/18 Baseline Weight: Weight: 73.9 kg Most recent weight: Weight: 76 kg     Palliative Assessment/Data:   PPS 40%  Time In:  9 Time Out:  10.10 Time Total:   70 min  Greater than 50%  of this time was spent counseling and coordinating care related to the above assessment and plan.  Signed by: Rosalin Hawking, MD [id number]  Please contact Palliative Medicine Team phone at [phone] for questions and concerns.  For individual provider: See Loretha Stapler

## 2018-04-18 NOTE — Progress Notes (Signed)
Patient to be discharged home.Son at bedside and would bring patient home via cab.Patient wears oxygen 24 hours at home,patient doesn't have her oxygen from home and nobody to bring it to the hospital.Son doesn't have a transport to go get oxygen at home. Son insists patient is fine without oxygen states "she's not gonna die from here to home without oxygen","she's going home regardless". Kirby,NP made aware. Per Ms Kirby,patient cannot go home without oxygen and if insists,will have to sign AMA.Patient and  patient's son made aware.Explained to patient and son that it is for patient's safety. Patient's son  then called police and they gave him number to call that will help with transport,patient was instructed to have hospital staff call the number. ED SW,Wanda to come up and talk to patient and son.Patient made aware that SW is coming up to talk to them and help with transport. Patient states "it's okay,it's already been taken cared of,I gave the number at the nurses station and somebody is calling the transport." Patient's son keeps coming up to the nurses' station saying "somebody's gonna lose their license". Lionel Woodberry, Drinda Butts, Charity fundraiser

## 2018-04-18 NOTE — Progress Notes (Signed)
[place]:  Met with pt niece her at hospital in room to discuss the Hospice Home in [place]. Pt is refusing to go to the hospice home and is constantly stating I want to go home and crying. She is confused. She does not remember [place] caring for her husband just a couple months ago at the [facility]. The niece reports that she is not going to make any decisions for the pt and that if there is any paper work to be signed for the pt to have hospice services then she will not be signing them. She feels this is the son's responsibility. However, the pt will not give anyone the son's phone number to contact him and discuss what is going on. The niece reports that Dr. Rowe Pavy has requested a psych consult to determine capacity. Plan for niece to try and get information from son and see if he wll be willing to meet with [place] after pt gets settled and is not so angry. I will follow up tomorrow and continue to assist in trying to get pt discharged to the appropriate setting. Webb Silversmith RN [phone]

## 2018-04-18 NOTE — Progress Notes (Signed)
Shift event: RN paged NP because pt is to be discharged, but has no O2 for transport. Son's plan was to transport pt home in a taxi cab. Pt is on O2 24/7 and has O2 at home. NP informed RN that pt would have to wait for d/c until tomorrow if she had no O2 for transport. Asked the RN to call NP back with an update.  Later, RN paged because SW was on floor and arranged for pt to be transported to home via [facility] and she will be transported with O2. Family states pt has home O2 per [facility] and has an O2 concentrator at home.  Therefore, it is OK for pt to leave tonight. DNR form signed.  KJKG, NP [facility]

## 2018-04-18 NOTE — Progress Notes (Signed)
K.Kirby, PA texted re: [employer] reported runs of SVT with rate in the 150's. Was noted earlier today. Pt asymptomatic.

## 2018-04-18 NOTE — Progress Notes (Signed)
[facility] transport here to pick patient up,patient's son was told to go home to patient's house so that somebody will be there when [facility] arrives with the patient. Son said he doesn't have key to the house nor does the patient,that Sallye Ober (niece) has the key.Son states " I see now,this is a Product/process development scientist, "I have you all recorded","you need to thank me for serving the country" while  pointing finger to this RN. Explained to son that there's no problem with transporting the patient,but there has to be somebody at home to open the door when [facility] and patient arrives.Patient's son keeps saying "this is a set up".This RN called Sallye Ober and left a message to call back. Awaiting call back from patient's niece.No call received from [place].Son then tried to call his Uncle,when asked about his call,he said "He's not coming", " Sallye Ober and my Kateri Mc are in this together". Tamirah George, Drinda Butts, Charity fundraiser

## 2018-04-18 NOTE — Progress Notes (Signed)
PMT no charge note  Discussed with Dr Arlean Hopping, also discussed with [facility] MD Dr. Jerolyn Center.   Patient seen again, she continues to endorse that she doesn't want to stay in the hospital, she doesn't want dialysis, she also refuses to give me her son's phone number, she states that her niece Sallye Ober has been her primary caregiver.   Call placed and discussed again with niece Sallye Ober, she is appreciative of the information she has received from Dr Arlean Hopping. We discussed about next steps, with regards to disposition, niece is agreeable to hospice.   Patient is uremic, patient has very little PO intake, she might continue to develop symptoms related to end stage kidney disease rather rapidly. She might be residential hospice appropriate. Niece states that the patient's husband died 2 months ago, he was enrolled at hospice home in [place]. [facility] and would like for the patient to go to hospice home in [place].   Plan Agree with DNR DNI Agree with no dialysis no heroic measures Focus on comfort measures CSW to be consulted to help facilitate hospice transfer.   Rosalin Hawking MD [facility] health palliative medicine team [phone]

## 2018-04-19 DIAGNOSIS — J449 Chronic obstructive pulmonary disease, unspecified: Secondary | ICD-10-CM

## 2018-04-19 DIAGNOSIS — Z008 Encounter for other general examination: Secondary | ICD-10-CM

## 2018-04-19 DIAGNOSIS — Z87891 Personal history of nicotine dependence: Secondary | ICD-10-CM

## 2018-04-19 LAB — CULTURE, BLOOD (ROUTINE X 2)
CULTURE: NO GROWTH
Culture: NO GROWTH
Special Requests: ADEQUATE

## 2018-04-19 LAB — GLUCOSE, CAPILLARY
GLUCOSE-CAPILLARY: 62 mg/dL — AB (ref 70–99)
Glucose-Capillary: 88 mg/dL (ref 70–99)

## 2018-04-19 MED ORDER — GLUCOSE 40 % PO GEL
ORAL | Status: AC
  Administered 2018-04-19: 37.5 g
  Filled 2018-04-19: qty 1

## 2018-04-19 NOTE — Progress Notes (Signed)
PROGRESS NOTE    BESAN KETCHEM  FTD:[id number] DOB: May 12, 1935 DOA: 04/19/2018 PCP: Clayborn Heron, MD    Brief Narrative:  83 year old female with past history of hypertension, COPD, on home oxygen, rheumatoid arthritis on Humira, psoriasis, GERD who presented to the emergency department with complaints of worsening shortness of breath for last 3 to 4 days. She was treated for mild copd exacerbation, and acute on stage 3 CKD. Nephrology consulted and recommended HD, pt declined HD and wanted to go home.  Palliative care consulted , who spoke to the patient's niece who has been the patient's care giver for many years. Pt and niece wanted to go home with hospice. The Son who is the next of Kin came yesterday evening and wanted the patient to be discharged home ASAP, but the patient couldn't leave as she didn't have the keys to her house. Her niece was contacted and plan to obtain the keys today. Meanwhile the hospice liaison spoke to the Niece who reported that she would no longer be involved with patient's care.  She is not safe for discharge home with hospice at this point. But pt is belligerent about going home . She is refusing care.   Assessment & Plan:   Principal Problem:   COPD exacerbation (HCC) Active Problems:   Essential hypertension   Rheumatoid arthritis (HCC)   Acute on chronic respiratory failure with hypoxia (HCC)   Hypokalemia   Elevated troponin   Acute renal failure superimposed on chronic kidney disease (HCC)   Prolonged Q-T interval on ECG   Acute respiratory failure with hypoxia: differential include mild copd exacerbation, interstitial lung disease:  [place] oxygen to keep sats greater than 90%.  Resume brovana and pulmicort, duonebs.     AKI on stage 3 CKD:  Baseline creatinine around 1.6.  Came in with a creatinine of 4, and continues to rise.  Nephrology consulted, Pt is refusing SNF.  Palliative care consulted, they wanted to go home with home with  home hospice.     RA: Resume ARAVA.    Chronic diastolic hear failure:  Resume home lasix.    Hypertension:  Well controlled.     Lower extremity edema from fluid overload:  Venous duplex is negative for DVT.    Hypoglycemia: from poor oral intake.    Deconditioning and worsening debility:  PT eval recommending SNF on discharge. Pt is refusing to go to SNF.   DVT prophylaxis: heparin sq. Code Status: DNR Family Communication: none at bedside.  Disposition Plan: pending eval by psychiatry.   Consultants:  Nephrology.  Palliative care.    Procedures: none.   Antimicrobials: none.  Subjective: Refused to take breathing treatments. Says she wants to go home.  She reported she is very upset that she is still in the hospital.   Objective: Vitals:   04/18/18 0744 04/18/18 0852 04/18/18 1629 04/19/18 0806  BP: (!) 162/83  (!) 185/90 (!) 171/88  Pulse: 87  83 84  Resp: 18  18 18   Temp: 98.3 F (36.8 C)  98.3 F (36.8 C) 98.3 F (36.8 C)  TempSrc: Oral  Oral Oral  SpO2: (!) 86% 92% 100% 97%  Weight:      Height:        Intake/Output Summary (Last 24 hours) at 04/19/2018 1113 Last data filed at 04/19/2018 0930 Gross per 24 hour  Intake 180 ml  Output 50 ml  Net 130 ml   Filed Weights   04/16/18 0500 04/16/18 2036 04/17/18  0500  Weight: 76 kg 76 kg 76 kg    Examination:  General exam: restless.  Respiratory system: Clear to auscultation. Respiratory effort normal. Cardiovascular system: S1 & S2 heard, RRR. No JVD, murmurs,  Gastrointestinal system: Abdomen is soft NT nd bs+ Central nervous system: Alert but would not answer any questions. She keeps repeating about going home.  Extremities: Symmetric 5 x 5 power. Skin: No rashes, lesions or ulcers Psychiatry: restless and slightly agitated. .     Data Reviewed: I have personally reviewed following labs and imaging studies  CBC: Recent Labs  Lab 04/09/2018 1828 04/15/18 0619  WBC 4.5 5.9  HGB  10.9* 11.1*  HCT 36.2 35.2*  MCV 84.0 82.1  PLT 190 242   Basic Metabolic Panel: Recent Labs  Lab 04/12/2018 1828 04/15/18 0619 04/16/18 0834  NA 143 140 142  K 3.1* 3.8 4.3  CL 106 108 108  CO2 26 19* 23  GLUCOSE 75 206* 135*  BUN 30* 42* 55*  CREATININE 4.01* 4.05* 4.37*  CALCIUM 8.4* 8.4* 8.5*   GFR: Estimated Creatinine Clearance: 10.9 mL/min (A) (by C-G formula based on SCr of 4.37 mg/dL (H)). Liver Function Tests: No results for input(s): AST, ALT, ALKPHOS, BILITOT, PROT, ALBUMIN in the last 168 hours. No results for input(s): LIPASE, AMYLASE in the last 168 hours. No results for input(s): AMMONIA in the last 168 hours. Coagulation Profile: Recent Labs  Lab 04/04/2018 2131  INR 1.04   Cardiac Enzymes: Recent Labs  Lab 04/22/2018 1828 04/14/18 0001 04/15/18 0619  TROPONINI 0.04* 0.04* 0.03*   BNP (last 3 results) No results for input(s): PROBNP in the last 8760 hours. HbA1C: No results for input(s): HGBA1C in the last 72 hours. CBG: Recent Labs  Lab 04/18/18 0741 04/18/18 1116 04/18/18 1627 04/19/18 0039 04/19/18 0804  GLUCAP 94 85 87 62* 88   Lipid Profile: No results for input(s): CHOL, HDL, LDLCALC, TRIG, CHOLHDL, LDLDIRECT in the last 72 hours. Thyroid Function Tests: No results for input(s): TSH, T4TOTAL, FREET4, T3FREE, THYROIDAB in the last 72 hours. Anemia Panel: No results for input(s): VITAMINB12, FOLATE, FERRITIN, TIBC, IRON, RETICCTPCT in the last 72 hours. Sepsis Labs: No results for input(s): PROCALCITON, LATICACIDVEN in the last 168 hours.  Recent Results (from the past 240 hour(s))  Culture, blood (routine x 2) Call MD if unable to obtain prior to antibiotics being given     Status: None   Collection Time: 04/01/2018 11:21 PM  Result Value Ref Range Status   Specimen Description   Final    BLOOD LEFT FOREARM Performed at [facility], [address] [place], [place] [address]    Special Requests   Final     BOTTLES DRAWN AEROBIC ONLY Blood Culture results may not be optimal due to an inadequate volume of blood received in culture bottles Performed at [facility], [address], [place], [place] [address]    Culture   Final    NO GROWTH 5 DAYS Performed at [facility], [address] [place], [place] [address]    Report Status 04/19/2018 FINAL  Final  Culture, blood (routine x 2) Call MD if unable to obtain prior to antibiotics being given     Status: None   Collection Time: 04/16/2018 11:21 PM  Result Value Ref Range Status   Specimen Description   Final    BLOOD LEFT HAND Performed at [facility], [address], [place], [place] [address]  Special Requests   Final    BOTTLES DRAWN AEROBIC ONLY Blood Culture adequate volume Performed at [facility], [address] [place], [place] [address]    Culture   Final    NO GROWTH 5 DAYS Performed at [facility], [address], [place], [place] [address]    Report Status 04/19/2018 FINAL  Final  Respiratory Panel by PCR     Status: None   Collection Time: 04/14/18 12:03 AM  Result Value Ref Range Status   Adenovirus NOT DETECTED NOT DETECTED Final   Coronavirus 229E NOT DETECTED NOT DETECTED Final   Coronavirus HKU1 NOT DETECTED NOT DETECTED Final   Coronavirus NL63 NOT DETECTED NOT DETECTED Final   Coronavirus OC43 NOT DETECTED NOT DETECTED Final   Metapneumovirus NOT DETECTED NOT DETECTED Final   Rhinovirus / Enterovirus NOT DETECTED NOT DETECTED Final   Influenza A NOT DETECTED NOT DETECTED Final   Influenza B NOT DETECTED NOT DETECTED Final   Parainfluenza Virus 1 NOT DETECTED NOT DETECTED Final   Parainfluenza Virus 2 NOT DETECTED NOT DETECTED Final   Parainfluenza Virus 3 NOT DETECTED NOT DETECTED Final   Parainfluenza Virus 4 NOT DETECTED NOT DETECTED Final   Respiratory Syncytial Virus NOT DETECTED NOT DETECTED Final   Bordetella pertussis NOT DETECTED  NOT DETECTED Final   Chlamydophila pneumoniae NOT DETECTED NOT DETECTED Final   Mycoplasma pneumoniae NOT DETECTED NOT DETECTED Final    Comment: Performed at [facility], [address] [place], [place] [address]         Radiology Studies: No results found.      Scheduled Meds: . arformoterol  15 mcg Nebulization BID  . budesonide (PULMICORT) nebulizer solution  0.5 mg Nebulization BID  . guaiFENesin  600 mg Oral BID  . heparin injection (subcutaneous)  5,000 Units Subcutaneous Q8H  . ipratropium-albuterol  3 mL Nebulization BID  . leflunomide  10 mg Oral Daily  . polyethylene glycol  17 g Oral Daily  . verapamil  240 mg Oral QHS   Continuous Infusions:   LOS: 6 days    Time spent: 35 minutes.     Kathlen Mody, MD [facility] Pager [phone]  If 7PM-7AM, please contact night-coverage [email or web address] Password [email or web address] 04/19/2018, 11:13 AM

## 2018-04-19 NOTE — Progress Notes (Signed)
Patient refused CBG check and vital signs. States "I'm going home." Anhar Mcdermott, [place], Charity fundraiser

## 2018-04-19 NOTE — Progress Notes (Signed)
PMT progress note  Chart reviewed Overnight events noted Discussed with case management, also discussed with TR[facility] MD Dr Blake Divine.   Patient is awake, resting by the edge of the bed. She is needing assistance to get on the bedside commode. She appears confused. There is no family present at the bedside.   BP (!) 171/88 (BP Location: Left Arm)   Pulse 84   Temp 98.3 F (36.8 C) (Oral)   Resp 18   Ht 5\' 7"  (1.702 m)   Wt 76 kg   SpO2 97%   BMI 26.24 kg/m   Labs and imaging noted.   Awake, but appears confused sitting by the edge of the bed In no distress Has edema Abdomen is not distended Patient refuses further exam, states she is going home.   AKI, with worsening CKD: deemed not an appropriate HD candidate, patient herself had been refusing placement of HD access and also was refusing dialysis. Niece primary caregiver concurred, plans were for patient to undergo hospice evaluation.   The patient's son's contact information is unknown. It is noted that he visited the hospital last night.   As discussed with case management and TRH MD, recommend psych evaluation, if it can be determined whether or not the patient is decisional. This has been puzzling, the patient, has been very clear about not wanting dialysis, not wanting tubes and machines, she has been asking to go home. How ever, patient is weak, there is no 24/7 assistance at home available. Recommend PT consultation consideration for SNF. Overall with ongoing decline expected from a renal standpoint. Difficult disposition options noted. Recommend SNF rehab, as patient is noted to not be a residential hospice candidate. Complex situation with her son, see chart from 04-18-18. Unfortunately, no additional PMT specific recommendations at this time other than to continue to advocate for safe discharge.   25 minutes spent 05-02-1969 MD [facility] health palliative medicine team [phone]

## 2018-04-19 NOTE — Progress Notes (Signed)
Kirby,NP notified that patient doesn't have a key to her house and [facility] cannot take her. Order received. Nikholas Geffre, Drinda Butts, Charity fundraiser

## 2018-04-19 NOTE — Progress Notes (Signed)
[place]:  Met with pt in her room. She asked me to get out. She stated she wasn't talking to me. Met with Lorriane Shire and let her know. She agrees that there needs to be an alternative plan but disposition will be very hard. I am seeing changes today from yesterday. She has refused all medications and is more confused today. She is requiring oxygen increase with hypoxia with walking. However, We continue to not have a residential bed available and our medical director feels she is just not quite ready for GIP level of care at [facility]. Even if she was I am uncertain of who would be available to sign consents especially if she continues to refuse everything except going home. We will continue to follow until the disposition at d/c is determined. [place] [facility] Liaison [phone]

## 2018-04-19 NOTE — Progress Notes (Signed)
Patient refused all her medication and CBG check today. MD is aware.

## 2018-04-19 NOTE — Consult Note (Signed)
[facility] Face-to-Face Psychiatry Consult   Reason for Consult:  Capacity evaluation  Referring Physician:  Dr. Blake Divine  Patient Identification: [name] MRN:  [id number] Principal Diagnosis: Evaluation by psychiatric service required Diagnosis:   Patient Active Problem List   Diagnosis Date Noted  . Hypokalemia [E87.6] 04/14/2018  . Elevated troponin [R74.8] 04/14/2018  . Acute renal failure superimposed on chronic kidney disease (HCC) [N17.9, N18.9] 04/14/2018  . Prolonged Q-T interval on ECG [R94.31] 04/14/2018  . COPD exacerbation (HCC) [J44.1] 04/19/2018  . Rheumatoid arthritis (HCC) [M06.9] 08/04/2016  . Microcytic anemia [D50.9] 08/04/2016  . CKD (chronic kidney disease), stage III (HCC) [N18.3] 08/04/2016  . Acute on chronic respiratory failure with hypoxia (HCC) [J96.21] 08/04/2016  . Multifocal atrial tachycardia (HCC) [I47.1] 03/29/2016  . COPD (chronic obstructive pulmonary disease) (HCC) [J44.9] 01/16/2008  . OBESITY HYPOVENTILATION SYNDROME [E67.8] 06/14/2007  . PULMONARY FIBROSIS [J84.10] 06/14/2007  . RESPIRATORY FAILURE, CHRONIC [J96.10] 06/14/2007  . Essential hypertension [I10] 02/26/2007  . OSTEOPENIA [M89.9, M94.9] 02/26/2007    Total Time spent with patient: 1 hour  Subjective:   [name] is a 83 y.o. female patient admitted with COPD exacerbation with hypoxia.  HPI:   Per chart review, patient was admitted with COPD exacerbation with hypoxia. She appears to have worsening confusion since hospitalization and has been refusing medical treatment including dialysis. She has been recommended for SNF placement due to decreased safety awareness, activity tolerance and balance impairments as well as short term memory deficits. She does not have a caregiver. Her son would like for her to discharge home with hospice care although this is not a viable plan.   On interview, [name] reports feeling bad due to having "pain all over." She reports coming to the  hospital due to dyspnea. She denies other medical problems although medical records indicate otherwise. She is unable to name her medications. She denies a psychiatric history. She denies SI, HI or AVH. She denies problems with sleep or appetite. She reports that her son and niece help her at home. She uses a walker to ambulate. She is unaware of the reason that SNF placement is recommended.   Past Psychiatric History: Denies   Risk to Self:  None. Denies SI.  Risk to Others:  None. Denies HI.  Prior Inpatient Therapy:  Denies  Prior Outpatient Therapy:  Denies   Past Medical History:  Past Medical History:  Diagnosis Date  . Arthritis   . COPD (chronic obstructive pulmonary disease) (HCC)   . Dysphagia   . Emphysema   . GERD (gastroesophageal reflux disease)   . Hypertension   . Insomnia   . Interstitial lung disease (HCC)   . Kidney stone   . Neuropathy   . On home O2    2L N/C  . Osteopenia   . Pancreatitis   . Psoriasis   . Psoriasis   . Pthirus inguinalis   . Renal disorder   . Rheumatoid arthritis(714.0)   . Vertigo   . Weight loss     Past Surgical History:  Procedure Laterality Date  . CHOLECYSTECTOMY    . VESICOVAGINAL FISTULA CLOSURE W/ TAH  1962   Family History:  Family History  Problem Relation Age of Onset  . Heart disease Sister   . Colon cancer Sister   . Asthma Sister    Family Psychiatric  History: Denies  Social History:  Social History   Substance and Sexual Activity  Alcohol Use No  Social History   Substance and Sexual Activity  Drug Use No    Social History   Socioeconomic History  . Marital status: Married    Spouse name: Not on file  . Number of children: Not on file  . Years of education: Not on file  . Highest education level: Not on file  Occupational History  . Not on file  Social Needs  . Financial resource strain: Not on file  . Food insecurity:    Worry: Not on file    Inability: Not on file  . Transportation  needs:    Medical: Not on file    Non-medical: Not on file  Tobacco Use  . Smoking status: Former Smoker    Packs/day: 0.50    Years: 50.00    Pack years: 25.00    Types: Cigarettes    Last attempt to quit: 08/01/2000    Years since quitting: 17.7  . Smokeless tobacco: Never Used  Substance and Sexual Activity  . Alcohol use: No  . Drug use: No  . Sexual activity: Not on file  Lifestyle  . Physical activity:    Days per week: Not on file    Minutes per session: Not on file  . Stress: Not on file  Relationships  . Social connections:    Talks on phone: Not on file    Gets together: Not on file    Attends religious service: Not on file    Active member of club or organization: Not on file    Attends meetings of clubs or organizations: Not on file    Relationship status: Not on file  Other Topics Concern  . Not on file  Social History Narrative  . Not on file   Additional Social History: She lives at home alone. She reports that her husband recently passed away a week ago. She was married for 48 years. She has 1 adult son. She denies alcohol or illicit substance use.     Allergies:  No Known Allergies  Labs:  Results for orders placed or performed during the hospital encounter of 04/29/2018 (from the past 48 hour(s))  Glucose, capillary     Status: Abnormal   Collection Time: 04/17/18  4:47 PM  Result Value Ref Range   Glucose-Capillary 114 (H) 70 - 99 mg/dL  Glucose, capillary     Status: Abnormal   Collection Time: 04/17/18  8:17 PM  Result Value Ref Range   Glucose-Capillary 114 (H) 70 - 99 mg/dL  Glucose, capillary     Status: Abnormal   Collection Time: 04/18/18 12:08 AM  Result Value Ref Range   Glucose-Capillary 106 (H) 70 - 99 mg/dL  Glucose, capillary     Status: None   Collection Time: 04/18/18  4:24 AM  Result Value Ref Range   Glucose-Capillary 79 70 - 99 mg/dL  Glucose, capillary     Status: None   Collection Time: 04/18/18  7:41 AM  Result Value Ref  Range   Glucose-Capillary 94 70 - 99 mg/dL  Glucose, capillary     Status: None   Collection Time: 04/18/18 11:16 AM  Result Value Ref Range   Glucose-Capillary 85 70 - 99 mg/dL  Glucose, capillary     Status: None   Collection Time: 04/18/18  4:27 PM  Result Value Ref Range   Glucose-Capillary 87 70 - 99 mg/dL  Glucose, capillary     Status: Abnormal   Collection Time: 04/19/18 12:39 AM  Result Value Ref Range  Glucose-Capillary 62 (L) 70 - 99 mg/dL  Glucose, capillary     Status: None   Collection Time: 04/19/18  8:04 AM  Result Value Ref Range   Glucose-Capillary 88 70 - 99 mg/dL    Current Facility-Administered Medications  Medication Dose Route Frequency Provider Last Rate Last Dose  . acetaminophen (TYLENOL) tablet 650 mg  650 mg Oral Q6H PRN Madelyn Flavors A, MD   650 mg at 04/15/18 0023   Or  . acetaminophen (TYLENOL) suppository 650 mg  650 mg Rectal Q6H PRN Madelyn Flavors A, MD      . arformoterol (BROVANA) nebulizer solution 15 mcg  15 mcg Nebulization BID Madelyn Flavors A, MD   15 mcg at 04/18/18 0852  . budesonide (PULMICORT) nebulizer solution 0.5 mg  0.5 mg Nebulization BID Madelyn Flavors A, MD   0.5 mg at 04/18/18 0852  . guaiFENesin (MUCINEX) 12 hr tablet 600 mg  600 mg Oral BID Katrinka Blazing, Rondell A, MD   600 mg at 04/18/18 1036  . heparin injection 5,000 Units  5,000 Units Subcutaneous Q8H Burnadette Pop, MD   5,000 Units at 04/18/18 1650  . hydrALAZINE (APRESOLINE) injection 10 mg  10 mg Intravenous Q4H PRN Madelyn Flavors A, MD   10 mg at 04/18/18 1650  . ipratropium-albuterol (DUONEB) 0.5-2.5 (3) MG/3ML nebulizer solution 3 mL  3 mL Nebulization Q4H PRN Smith, Rondell A, MD      . leflunomide (ARAVA) tablet 10 mg  10 mg Oral Daily Smith, Rondell A, MD   10 mg at 04/18/18 1037  . MUSCLE RUB CREA   Topical PRN Burnadette Pop, MD      . ondansetron (ZOFRAN) injection 4 mg  4 mg Intravenous Q6H PRN Burnadette Pop, MD   4 mg at 04/15/18 1352  . oxyCODONE-acetaminophen  (PERCOCET/ROXICET) 5-325 MG per tablet 1 tablet  1 tablet Oral Q6H PRN Burnadette Pop, MD   1 tablet at 04/17/18 2216  . phenol (CHLORASEPTIC) mouth spray 1 spray  1 spray Mouth/Throat PRN Adhikari, Amrit, MD      . polyethylene glycol (MIRALAX / GLYCOLAX) packet 17 g  17 g Oral Daily Burnadette Pop, MD   17 g at 04/18/18 1040  . technetium albumin aggregated (MAA) injection solution 4 millicurie  4 millicurie Intravenous Once PRN Loralie Champagne, MD      . technetium TC 59M diethylenetriame-pentaacetic acid (DTPA) injection 31 millicurie  31 millicurie Intravenous Once PRN Loralie Champagne, MD      . verapamil (CALAN-SR) CR tablet 240 mg  240 mg Oral QHS Madelyn Flavors A, MD   240 mg at 04/17/18 2216    Musculoskeletal: Strength & Muscle Tone: Generalized weakness. Gait & Station: UTA since patient was sitting on her bed. Patient leans: N/A  Psychiatric Specialty Exam: Physical Exam  Nursing note and vitals reviewed. Constitutional: She appears well-developed and well-nourished.  HENT:  Head: Normocephalic and atraumatic.  Neck: Normal range of motion.  Respiratory: Effort normal.  Musculoskeletal: Normal range of motion.  Neurological: She is alert.  Oriented to person, place and month.   Psychiatric: She has a normal mood and affect. Her speech is normal and behavior is normal. Judgment and thought content normal. Cognition and memory are impaired.    Review of Systems  Cardiovascular: Negative for chest pain.  Gastrointestinal: Negative for abdominal pain, constipation, diarrhea, nausea and vomiting.  Psychiatric/Behavioral: Negative for depression, hallucinations, substance abuse and suicidal ideas. The patient is not nervous/anxious and does not have insomnia.  All other systems reviewed and are negative.   Blood pressure (!) 171/88, pulse 84, temperature 98.3 F (36.8 C), temperature source Oral, resp. rate 18, height 5\' 7"  (1.702 m), weight 76 kg, SpO2 97 %.Body mass index  is 26.24 kg/m.  General Appearance: Fairly Groomed, elderly, African American female, wearing casual clothes with a hair scarf who is sitting on the edge of her bed. NAD.   Eye Contact:  Good  Speech:  Clear and Coherent and Normal Rate  Volume:  Normal  Mood:  Euthymic  Affect:  Appropriate  Thought Process:  Goal Directed, Linear and Descriptions of Associations: Intact  Orientation:  Other:  Oriented to person, place and month.  Thought Content:  Logical  Suicidal Thoughts:  No  Homicidal Thoughts:  No  Memory:  Immediate;   Poor Recent;   Fair Remote;   Fair  Judgement:  Poor  Insight:  Fair  Psychomotor Activity:  Normal  Concentration:  Concentration: Good and Attention Span: Good. She has poor hearing and often asks for questions to be repeated.   Recall:  of Knowledge:  Fair  Language:  Fair  Akathisia:  No  Handed:  Right  AIMS (if indicated):   N/A  Assets:  Communication Skills Housing Social Support  ADL's:  Impaired  Cognition:  Impaired with short and long term memory deficits.   Sleep:   Okay   Assessment: [name] is a 83 y.o. female who was admitted with COPD exacerbation with hypoxia. She does not demonstrate capacity to refuse SNF placement. She does not appreciate her current condition and is unaware of why SNF placement is recommended. She will need an authorized representative to make these decisions.   Treatment Plan Summary: -Patient does not have capacity to refuse SNF placement.  -Psychiatry will sign off on patient at this time. Please consult psychiatry again as needed.  Disposition: No evidence of imminent risk to self or others at present.   Patient does not meet criteria for psychiatric inpatient admission.  96, DO 04/19/2018 1:44 PM

## 2018-04-19 NOTE — Progress Notes (Addendum)
Physical Therapy Treatment Patient Details Name: [name] MRN: [id number] DOB: 10/01/1934 Today's Date: 04/19/2018    History of Present Illness Pt is an 83 year old woman admitted with COPD exacerbation with hypoxia. PMH: RA, HTN, interstitial lung disease, neuropathy. Pt is on 3L 02 at home.    PT Comments    Patient with slow progress towards her physical therapy goals. Continues with difficulty with problem solving, poor awareness, attention, and short term memory deficits in addition to decreased activity tolerance and balance impairments. Ambulating 5 feet with walker and minimal assistance before fatiguing and requiring a seated rest break. SpO2 78% on 4L O2; RN and MD aware. Decreased gait speed indicative of high risk of falling. D/c plan remains appropriate.     Follow Up Recommendations  SNF;Supervision/Assistance - 24 hour     Equipment Recommendations  None recommended by PT    Recommendations for Other Services       Precautions / Restrictions Precautions Precautions: Fall Precaution Comments: chronic 3L O2 at rest, reports increase to 4L O2 with activity Restrictions Weight Bearing Restrictions: No    Mobility  Bed Mobility Overal bed mobility: Needs Assistance Bed Mobility: Sit to Supine;Supine to Sit     Supine to sit: Min guard Sit to supine: Min guard   General bed mobility comments: patient using hands to pick up her legs secondary to gross weakness. significantly increased time and effort  Transfers Overall transfer level: Needs assistance Equipment used: Rolling walker (2 wheeled) Transfers: Sit to/from Stand Sit to Stand: Min assist         General transfer comment: min assist to boost to stand from bed and [facility]  Ambulation/Gait Ambulation/Gait assistance: Min assist Gait Distance (Feet): 5 Feet Assistive device: Rolling walker (2 wheeled) Gait Pattern/deviations: Step-through pattern;Decreased stride length;Trunk flexed Gait  velocity: decreased Gait velocity interpretation: <1.31 ft/sec, indicative of household ambulator General Gait Details: patient fatiguing quickly. requires max directional cues.    Stairs             Wheelchair Mobility    Modified Rankin (Stroke Patients Only)       Balance Overall balance assessment: Needs assistance Sitting-balance support: No upper extremity supported;Feet supported Sitting balance-Leahy Scale: Good     Standing balance support: Bilateral upper extremity supported Standing balance-Leahy Scale: Poor                              Cognition Arousal/Alertness: Awake/alert Behavior During Therapy: WFL for tasks assessed/performed Overall Cognitive Status: Impaired/Different from baseline Area of Impairment: Memory;Safety/judgement;Problem solving                     Memory: Decreased short-term memory   Safety/Judgement: Decreased awareness of safety;Decreased awareness of deficits   Problem Solving: Slow processing;Decreased initiation;Difficulty sequencing;Requires verbal cues        Exercises      General Comments General comments (skin integrity, edema, etc.): pt niece in room      Pertinent Vitals/Pain Pain Assessment: Faces Faces Pain Scale: No hurt    Home Living                      Prior Function            PT Goals (current goals can now be found in the care plan section) Acute Rehab PT Goals Patient Stated Goal: go home Potential to Achieve Goals: Fair  Frequency    Min 2X/week      PT Plan Current plan remains appropriate    Co-evaluation              AM-PAC PT "6 Clicks" Daily Activity  Outcome Measure  Difficulty turning over in bed (including adjusting bedclothes, sheets and blankets)?: A Lot Difficulty moving from lying on back to sitting on the side of the bed? : A Lot Difficulty sitting down on and standing up from a chair with arms (e.g., wheelchair, bedside  commode, etc,.)?: Unable Help needed moving to and from a bed to chair (including a wheelchair)?: A Little Help needed walking in hospital room?: A Little Help needed climbing 3-5 steps with a railing? : Total 6 Click Score: 12    End of Session Equipment Utilized During Treatment: Gait belt;Oxygen Activity Tolerance: Patient limited by fatigue Patient left: in bed;with call bell/phone within reach;with family/visitor present Nurse Communication: Mobility status;Other (comment)(oxygen saturations) PT Visit Diagnosis: Other abnormalities of gait and mobility (R26.89)     Time: 2505-3976 PT Time Calculation (min) (ACUTE ONLY): 28 min  Charges:  $Therapeutic Activity: 23-37 mins                    Laurina Bustle, PT, DPT Acute Rehabilitation Services Pager [phone] Office [phone]    Vanetta Mulders 04/19/2018, 1:22 PM

## 2018-04-19 NOTE — Clinical Social Work Note (Signed)
CSW talked with patient's son, Marcelline Mates (4:35 pm) as he called the hospital and was talking with patient's nurse. He talked with CSW about patient's discharge disposition and made it clear that he knows that his mother wants to go home. Patient reported that he is his mother's only child and her deceased husband was not his father. Mr. Daphine Deutscher did not understand why his mother has not discharge, stating that the keys were brought to the hospital. Son expressed awareness that his mother wants to go home and added that he does not see anything wrong with it.   Mr. Daphine Deutscher also discussed his mother's discharge home, expressed awareness that she is ready to go home and stated that he does not see anything wrong with it. He is not in agreement with Hospice services and expressed his feelings that this is a way to kill his mother. Son talked about his family throughout the conversation stating that: they went another direction with things, they bailed out and he does not understand why they don't want to be bothered with the situation, and once she gets to her place, they will come back around, and his mother has lots of family, but "we only have each other". Mr. Daphine Deutscher also expressed that "he is in charge not them". Son commented that the family was not keeping him updated regarding his mother.  CSW talked with son about the importance of patient having 24/7 care at home and when asked, he responded that he will be there 24/7. Patient was asked if he worked and responded "I'm good", and that he was not going into any personal information. CSW asked Mr. Daphine Deutscher for his phone number and this was provided: [phone]. CSW talked with son about contacting him regarding his mom's discharge and leaving a message and patient adamantly responded that CSW was not to leave a voicemail - "Do Not!", "Do not do that!", "I will change my phone number" if you leave a VM". He also asked CSW not to text him. CSW also advised son  that the doctor might want to talk with him or the home heath representative and he does not want to talk with a doctor over the phone and also does not want anyone from a home health company to call him. Son indicated that the person from home health can meet him where his mother stays, he does not want to be called. Mr. Daphine Deutscher asked that I have his mother call him.  CSW visited with patient and she was sitting up on the side of the bed and was dressed. CSW communicated request from son and she made a face  Ms. Creswell also informed CSW that she does not want [place] calling her son when she discharges. CSW explained the reasons communication with her son was necessary, including to ensure a safe discharge and she res[onded that "I heard that yesterday!". MD was contacted and updated on my conversation with Mr. Daphine Deutscher and patient. CSW will continue to follow, provide SW intervention services as needed and assist with patient's discharge as needed.  Genelle Bal, MSW, LCSW Licensed Clinical Social Worker Clinical Social Work Department [employer] [phone]
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
## 2018-04-19 NOTE — Care Management Note (Signed)
Case Management Note  Patient Details  Name: BETSIE PECKMAN MRN: [id number] Date of Birth: 1934/12/16  Action/Plan: Patient is for discharge home with hospice care provided by [facility]; patient has periods of confusion. TCT patient's niece Ms Orson Slick, she stated that she will no longer provide care for the patient at her home and her son will take primary responsibility; she cannot give CM his name or telephone number; she stated that her sister will come to the hospital to drop off the house key; CM talked to patient at the bedside, she is refusing to give CM her son's telephone number. CM talked to [facility] with [facility], unsafe discharge at this time due to no support person in the home to care for the patient along with Hospice, unable to locate/ call son; Attending MD updated; plan to have Psych eval. Very difficult case, CM will continue to follow for progression of care.  Expected Discharge Date:  Undetermined at this time              Expected Discharge Plan: possibly home with hospice  Discharge planning Services  CM Consult  Post Acute Care Choice:  Home Health Choice offered to:  Patient HH Arranged:  RN, PT [facility] Agency:  [facility]  Status of Service:   In progress  Reola Mosher [phone] 04/19/2018, 10:51 AM

## 2018-04-19 NOTE — Progress Notes (Signed)
Pt refused breathing tx x2 while in room.  RN offered as well, pt refused.

## 2018-04-19 NOTE — Progress Notes (Signed)
Patient refused to have vital signs taken last night,also refused HS meds. and refused to have CBG be rechecked after it was treated because CBG was 62. Shmuel Girgis, Drinda Butts, Charity fundraiser
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
## 2018-04-20 NOTE — Progress Notes (Signed)
PROGRESS NOTE    [name]  SVX:[id number] DOB: 1934-10-09 DOA: May 02, 2018 PCP: Clayborn Heron, MD    Brief Narrative:  83 year old female with past history of hypertension, COPD, on home oxygen, rheumatoid arthritis on Humira, psoriasis, GERD who presented to the emergency department with complaints of worsening shortness of breath for last 3 to 4 days. She was treated for mild copd exacerbation, and acute on stage 3 CKD. Nephrology consulted and recommended HD, pt declined HD and wanted to go home.  Palliative care consulted , who spoke to the patient's niece who has been the patient's care giver for many years. Pt and niece wanted to go home with hospice. The Son who is the next of Kin came yesterday evening and wanted the patient to be discharged home ASAP, but the patient couldn't leave as she didn't have the keys to her house. Her niece was contacted and obtained the keys to her house. Meanwhile the hospice liaison spoke to the Niece who reported that she would no longer be involved with patient's care.  She is not safe for discharge home with hospice or without hospice at this point, because she needs 24 hour supervision and we do not have any reliable family member who can provide that care. Son is the next of kin and I was unable to contact him the last two days. Requested Ethics consult, suggested getting SW to involve APS to determine if she is safe to be discharged home.    Assessment & Plan:   Principal Problem:   Evaluation by psychiatric service required Active Problems:   Essential hypertension   Rheumatoid arthritis (HCC)   Acute on chronic respiratory failure with hypoxia (HCC)   COPD exacerbation (HCC)   Hypokalemia   Elevated troponin   Acute renal failure superimposed on chronic kidney disease (HCC)   Prolonged Q-T interval on ECG   Acute respiratory failure with hypoxia: differential include mild copd exacerbation, interstitial lung disease:  [place] oxygen  to keep sats greater than 90%. She is currently on 3 to 4lit of [place] oxygen to keep sats greater than 90%.  Resume brovana and pulmicort, duonebs.     AKI on stage 3 CKD:  Baseline creatinine around 1.6.  Came in with a creatinine of 4, and continues to rise. She is refusing labs and any kind of work up or HD.  Nephrology consulted , recommended not a good candidate for HD due to co morbidities.  Palliative care consulted, the initial plan was to go home with hospice. But the son spoke to SW and did not want any hospice involved in her care.     RA: Resume ARAVA.    Chronic diastolic hear failure:  Stable.    Hypertension:  Well controlled. resume verapamil.     Lower extremity edema from fluid overload:  Venous duplex is negative for DVT.    Hypoglycemia: from poor oral intake. Pt refusing CBG'S.   Deconditioning and worsening debility:  PT eval recommending SNF on discharge. Pt is refusing to go to SNF. Psychiatry consulted and suggested she does not have the capacity to refuse SNF.  Today she appears more calm and talking and holding a conversation. Reiterated that she would like to go home and not SNF.   DVT prophylaxis: heparin sq. Code Status: DNR Family Communication: none at bedside.  Disposition Plan: pending social work.   Consultants:  Nephrology.  Palliative care.    Procedures: none.   Antimicrobials: none.   Subjective:  She does not want any sticks for blood draw or CBG, wants to go home. She does not want HD.   Objective: Vitals:   04/18/18 1629 04/19/18 0806 04/19/18 2218 04/20/18 0500  BP:  (!) 171/88    Pulse: 83 84 85   Resp: 18 18 12    Temp: 98.3 F (36.8 C) 98.3 F (36.8 C) 98.2 F (36.8 C)   TempSrc: Oral Oral Oral   SpO2: 100% 97% 99%   Weight:    77.1 kg  Height:        Intake/Output Summary (Last 24 hours) at 04/20/2018 0838 Last data filed at 04/20/2018 0600 Gross per 24 hour  Intake 0 ml  Output 325 ml  Net -325 ml    Filed Weights   04/16/18 2036 04/17/18 0500 04/20/18 0500  Weight: 76 kg 76 kg 77.1 kg    Examination:  General exam: calm and sitting int he chair, not in any distress.  Respiratory system: diminished at bases , no wheezing or rhonchi.  Cardiovascular system: S1 & S2 heard, RRR. No JVD, murmurs,  Gastrointestinal system: Abdomen is soft non tender bowel sounds good.  Central nervous system: alert and comfortable. Oriented to place and person.  Extremities: Symmetric 5 x 5 power. Skin: No rashes, lesions or ulcers Psychiatry: calm.     Data Reviewed: I have personally reviewed following labs and imaging studies  CBC: Recent Labs  Lab 04/24/2018 1828 04/15/18 0619  WBC 4.5 5.9  HGB 10.9* 11.1*  HCT 36.2 35.2*  MCV 84.0 82.1  PLT 190 242   Basic Metabolic Panel: Recent Labs  Lab 04/19/2018 1828 04/15/18 0619 04/16/18 0834  NA 143 140 142  K 3.1* 3.8 4.3  CL 106 108 108  CO2 26 19* 23  GLUCOSE 75 206* 135*  BUN 30* 42* 55*  CREATININE 4.01* 4.05* 4.37*  CALCIUM 8.4* 8.4* 8.5*   GFR: Estimated Creatinine Clearance: 11 mL/min (A) (by C-G formula based on SCr of 4.37 mg/dL (H)). Liver Function Tests: No results for input(s): AST, ALT, ALKPHOS, BILITOT, PROT, ALBUMIN in the last 168 hours. No results for input(s): LIPASE, AMYLASE in the last 168 hours. No results for input(s): AMMONIA in the last 168 hours. Coagulation Profile: Recent Labs  Lab 04/21/2018 2131  INR 1.04   Cardiac Enzymes: Recent Labs  Lab 04/07/2018 1828 04/14/18 0001 04/15/18 0619  TROPONINI 0.04* 0.04* 0.03*   BNP (last 3 results) No results for input(s): PROBNP in the last 8760 hours. HbA1C: No results for input(s): HGBA1C in the last 72 hours. CBG: Recent Labs  Lab 04/18/18 0741 04/18/18 1116 04/18/18 1627 04/19/18 0039 04/19/18 0804  GLUCAP 94 85 87 62* 88   Lipid Profile: No results for input(s): CHOL, HDL, LDLCALC, TRIG, CHOLHDL, LDLDIRECT in the last 72 hours. Thyroid  Function Tests: No results for input(s): TSH, T4TOTAL, FREET4, T3FREE, THYROIDAB in the last 72 hours. Anemia Panel: No results for input(s): VITAMINB12, FOLATE, FERRITIN, TIBC, IRON, RETICCTPCT in the last 72 hours. Sepsis Labs: No results for input(s): PROCALCITON, LATICACIDVEN in the last 168 hours.  Recent Results (from the past 240 hour(s))  Culture, blood (routine x 2) Call MD if unable to obtain prior to antibiotics being given     Status: None   Collection Time: 04/03/2018 11:21 PM  Result Value Ref Range Status   Specimen Description   Final    BLOOD LEFT FOREARM Performed at [facility], [address] [place], [place] [address]  Special Requests   Final    BOTTLES DRAWN AEROBIC ONLY Blood Culture results may not be optimal due to an inadequate volume of blood received in culture bottles Performed at [facility], [address] [place], [place] [address]    Culture   Final    NO GROWTH 5 DAYS Performed at [facility], [address], [place], [place] [address]    Report Status 04/19/2018 FINAL  Final  Culture, blood (routine x 2) Call MD if unable to obtain prior to antibiotics being given     Status: None   Collection Time: 05/13/18 11:21 PM  Result Value Ref Range Status   Specimen Description   Final    BLOOD LEFT HAND Performed at [facility], [address] [place], [place] [address]    Special Requests   Final    BOTTLES DRAWN AEROBIC ONLY Blood Culture adequate volume Performed at [facility], [address] [place], [place] [address]    Culture   Final    NO GROWTH 5 DAYS Performed at [facility], [address], [place], [place] [address]    Report Status 04/19/2018 FINAL  Final  Respiratory Panel by PCR     Status: None   Collection Time: 04/14/18 12:03 AM  Result Value Ref Range Status   Adenovirus NOT DETECTED NOT DETECTED Final   Coronavirus 229E NOT  DETECTED NOT DETECTED Final   Coronavirus HKU1 NOT DETECTED NOT DETECTED Final   Coronavirus NL63 NOT DETECTED NOT DETECTED Final   Coronavirus OC43 NOT DETECTED NOT DETECTED Final   Metapneumovirus NOT DETECTED NOT DETECTED Final   Rhinovirus / Enterovirus NOT DETECTED NOT DETECTED Final   Influenza A NOT DETECTED NOT DETECTED Final   Influenza B NOT DETECTED NOT DETECTED Final   Parainfluenza Virus 1 NOT DETECTED NOT DETECTED Final   Parainfluenza Virus 2 NOT DETECTED NOT DETECTED Final   Parainfluenza Virus 3 NOT DETECTED NOT DETECTED Final   Parainfluenza Virus 4 NOT DETECTED NOT DETECTED Final   Respiratory Syncytial Virus NOT DETECTED NOT DETECTED Final   Bordetella pertussis NOT DETECTED NOT DETECTED Final   Chlamydophila pneumoniae NOT DETECTED NOT DETECTED Final   Mycoplasma pneumoniae NOT DETECTED NOT DETECTED Final    Comment: Performed at [facility], [address] [place], [place] [address]         Radiology Studies: No results found.      Scheduled Meds: . arformoterol  15 mcg Nebulization BID  . budesonide (PULMICORT) nebulizer solution  0.5 mg Nebulization BID  . guaiFENesin  600 mg Oral BID  . heparin injection (subcutaneous)  5,000 Units Subcutaneous Q8H  . leflunomide  10 mg Oral Daily  . polyethylene glycol  17 g Oral Daily  . verapamil  240 mg Oral QHS   Continuous Infusions:   LOS: 7 days    Time spent: 35 minutes.     Kathlen Mody, MD [facility] Pager [phone]  If 7PM-7AM, please contact night-coverage [email or web address] Password [facility] 04/20/2018, 8:38 AM

## 2018-04-20 NOTE — Progress Notes (Signed)
MD,  Pt has been refusing all HHN treatments for 2 days.

## 2018-04-20 NOTE — Progress Notes (Signed)
Patient refused VS and CBG this morning.
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
## 2018-04-20 NOTE — Progress Notes (Signed)
Occupational Therapy Treatment Patient Details Name: [name] MRN: [id number] DOB: [birth year] Today's Date: 04/20/2018    History of present illness Pt is an 83 year old woman admitted with COPD exacerbation with hypoxia. PMH: RA, HTN, interstitial lung disease, neuropathy. Pt is on 3L 02 at home.   OT comments  Pt progressing towards acute OT. Pt noted to have very flat affect this session. Confusion noted this session. Unable to report how she is feeling or if she needed to void, repeating "I don't know how I'm feeling." At times brief episodes of making quiet crying sounds. Verbal perseveration noted. Difficulty sequencing movements with rw. Pt completed stand pivot to BSC, pericare, LB dressing, and walked a short distance to sit up in recliner. Given combination of decreased cognition, safety awareness and impaired balance pt is at high risk for falls. Noted refusal of SNF, pt will need 24/7 supervision/assist at home and is not safe to be left home alone.    Follow Up Recommendations  SNF;Supervision/Assistance - 24 hour    Equipment Recommendations  3 in 1 bedside commode    Recommendations for Other Services      Precautions / Restrictions Precautions Precautions: Fall Precaution Comments: chronic 3L O2 at rest, reports increase to 4L O2 with activity Restrictions Weight Bearing Restrictions: No       Mobility Bed Mobility Overal bed mobility: Needs Assistance Bed Mobility: Supine to Sit     Supine to sit: Min assist     General bed mobility comments: patient using hands to pick up her legs secondary to gross weakness. reaching for therapist assist with elevating trunk  Transfers Overall transfer level: Needs assistance Equipment used: Rolling walker (2 wheeled) Transfers: Sit to/from Stand Sit to Stand: Min assist;From elevated surface Stand pivot transfers: Min assist       General transfer comment: EOB, BSC, recliner. Cues for sequencing. Tendency to  leave rw behind during turns. Cues for safety navigating her environment    Balance Overall balance assessment: Needs assistance Sitting-balance support: No upper extremity supported;Feet supported Sitting balance-Leahy Scale: Good Sitting balance - Comments: can reach to feet without LOB   Standing balance support: Bilateral upper extremity supported Standing balance-Leahy Scale: Poor Standing balance comment: B UE support of walker and external assist needed                            ADL either performed or assessed with clinical judgement   ADL Overall ADL's : Needs assistance/impaired                     Lower Body Dressing: Maximal assistance;Sit to/from stand   Toilet Transfer: Minimal assistance;Stand-pivot;BSC;RW   Toileting- Clothing Manipulation and Hygiene: Moderate assistance;Sit to/from stand       Functional mobility during ADLs: Minimal assistance;Rolling walker General ADL Comments: Pt completed bed mobility, pivot to BSC, pericare, LB dressing, and ambulated short distance to recliner.     Vision       Perception     Praxis      Cognition Arousal/Alertness: Awake/alert Behavior During Therapy: Flat affect;Anxious Overall Cognitive Status: Impaired/Different from baseline Area of Impairment: Memory;Safety/judgement;Problem solving;Following commands;Orientation;Attention;Awareness                 Orientation Level: Place;Time;Situation Current Attention Level: Sustained Memory: Decreased recall of precautions;Decreased short-term memory Following Commands: Follows one step commands with increased time Safety/Judgement: Decreased awareness of safety;Decreased awareness of  deficits Awareness: Intellectual Problem Solving: Slow processing;Decreased initiation;Difficulty sequencing;Requires verbal cues General Comments: Limited responses. Very flat affect. At times making quiet crying sounds. Unable to answer how she is feeling  today, if she's hurting anywhere or if she needs to void, "I don't know."         Exercises     Shoulder Instructions       General Comments Pt commenting a few times on BLE edema, "Look how much my feet have swollen."     Pertinent Vitals/ Pain       Pain Assessment: Faces Pain Score: 0-No pain  Home Living                                          Prior Functioning/Environment              Frequency  Min 2X/week        Progress Toward Goals  OT Goals(current goals can now be found in the care plan section)  Progress towards OT goals: Progressing toward goals  Acute Rehab OT Goals Patient Stated Goal: go home OT Goal Formulation: With patient Time For Goal Achievement: 04/29/18 Potential to Achieve Goals: Good ADL Goals Pt Will Perform Grooming: with min assist;standing Pt Will Perform Upper Body Dressing: with supervision;with set-up;sitting Pt Will Perform Lower Body Dressing: with min assist;with adaptive equipment;sit to/from stand Pt Will Transfer to Toilet: with min assist;ambulating;bedside commode Pt Will Perform Toileting - Clothing Manipulation and hygiene: with min assist;sit to/from stand Additional ADL Goal #1: Pt will utilizing energy conservation and breathing techniques during ADL and mobility with minimal verbal cues.  Plan Discharge plan remains appropriate    Co-evaluation                 AM-PAC PT "6 Clicks" Daily Activity     Outcome Measure   Help from another person eating meals?: None Help from another person taking care of personal grooming?: A Little Help from another person toileting, which includes using toliet, bedpan, or urinal?: A Lot Help from another person bathing (including washing, rinsing, drying)?: A Lot Help from another person to put on and taking off regular upper body clothing?: A Little Help from another person to put on and taking off regular lower body clothing?: A Lot 6 Click Score:  16    End of Session Equipment Utilized During Treatment: Gait belt;Rolling walker;Oxygen  OT Visit Diagnosis: Unsteadiness on feet (R26.81);Other abnormalities of gait and mobility (R26.89);Muscle weakness (generalized) (M62.81);Pain;Other symptoms and signs involving cognitive function   Activity Tolerance Patient limited by fatigue   Patient Left in chair;with call bell/phone within reach;with chair alarm set;with family/visitor present(family arrived at end of session)   Nurse Communication Other (comment);Mobility status(cognition)        Time: 7673-4193 OT Time Calculation (min): 43 min  Charges: OT General Charges $OT Visit: 1 Visit OT Treatments $Self Care/Home Management : 38-52 mins  Raynald Kemp, OT Acute Rehabilitation Services Pager: [phone] Office: [phone]    Pilar Grammes 04/20/2018, 9:44 AM

## 2018-04-20 NOTE — Progress Notes (Signed)
[facility]: Follow up with pt and help with discharge plan. She does not want [place] to notify her son that we can help. She just states she wants to go home. She does not remember meeting or talking with me either of the last two days. She will not give [place] the phone number to call son and try to see if he is interested in our services once discharged. She has been walking to restroom with nurse today and continues to eat well however also continues to refuse all treatments. She has been approved for hospice care and we can follow her with discharge-- we typically like to contact family that will be helping provide the care. Spoke to [employer] and she will let us know if pt to discharge today or over weekend. Norm Parcel RN

## 2018-04-20 NOTE — Progress Notes (Signed)
Patient refused vital signs,cbg checks and HS meds. [name], Drinda Butts, Charity fundraiser

## 2018-04-21 DIAGNOSIS — R0602 Shortness of breath: Secondary | ICD-10-CM

## 2018-04-21 DIAGNOSIS — N185 Chronic kidney disease, stage 5: Secondary | ICD-10-CM

## 2018-04-21 NOTE — Progress Notes (Signed)
Patient refused morning medication. MD notified

## 2018-04-21 NOTE — Progress Notes (Signed)
PROGRESS NOTE  [name] QMG:[id number] DOB: 1934/08/03 DOA: April 29, 2018 PCP: Clayborn Heron, MD  HPI/Brief Narrative  [name] is a 83 y.o. year old female with medical history significant for  hypertension, COPD, on home oxygen, rheumatoid arthritis on Humira, psoriasis, GERD who presented on 2018-04-29 with  worsening shortness of breath for last 3 to 4 days and was found to have hypoxia secondary to COPD exacerbation and acute worsening of chronic stage 3 CKD.  Nephrology was consulted however patient declined dialysis.  Hospital stay further complicated by patient declining SNF placement however psychiatry team patient does not have capacity as she is unaware of why SNF placement was recommended and would need authorized representatives to make decisions.  Tentative plan was for patient to go home with hospice however this is on hold now as son wanted patient discharged home soon as possible however no safe discharge plan in place to ensure patient has 24-hour assistance whether she goes home home with hospice or not.  Subjective Continues to state that she wants to go home  Assessment/Plan:  Acute hypoxic respiratory failure, likely secondary to mild COPD exacerbation in the setting of chronic interstitial lung disease.  Continue 3 to 4 L nasal cannula oxygen to keep sats greater than 80%.  Continue on Pulmicort and Brovana.  Mucinex for supportive care  RA continue leflunomide  Hypertension, at goal.  Continue home verapamil  Significant lower extremity edema related to fluid overload, likely secondary to worsening kidney dysfunction.  Venous duplex negative for DVT  Hypoglycemia patient has poor oral intake.  Refusing all lab draws  Deconditioning worsening debility PT recommends SNF, patient declining however does not capacity to decline.  Completed social as next week and his son has been difficult to reach and has asked for patient be discharged home Code Status:  DNR  Family Communication: Will continue to try to reach son  Disposition Plan: Social work discussed with APS, will likely need ethics consult   Consultants:  Nephrology, palliative care       Antimicrobials: Anti-infectives (From admission, onward)   Start     Dose/Rate Route Frequency Ordered Stop   2018-04-29 2330  ceFEPIme (MAXIPIME) 1 g in sodium chloride 0.9 % 100 mL IVPB  Status:  Discontinued     1 g 200 mL/hr over 30 Minutes Intravenous Daily at bedtime 04-29-18 2308 04/15/18 1116          Telemetry: None, patient refused  DVT prophylaxis: Heparin   Objective: Vitals:   04/19/18 0806 04/19/18 2218 04/20/18 0500 04/20/18 1957  BP: (!) 171/88     Pulse: 84 85  100  Resp: 18 12  20   Temp: 98.3 F (36.8 C) 98.2 F (36.8 C)  98 F (36.7 C)  TempSrc: Oral Oral  Oral  SpO2: 97% 99%  98%  Weight:   77.1 kg   Height:        Intake/Output Summary (Last 24 hours) at 04/21/2018 2318 Last data filed at 04/21/2018 2237 Gross per 24 hour  Intake 230 ml  Output 150 ml  Net 80 ml   Filed Weights   04/16/18 2036 04/17/18 0500 04/20/18 0500  Weight: 76 kg 76 kg 77.1 kg    Exam:  Constitutional: Elderly female, no distress Eyes: EOMI, anicteric, normal conjunctivae Cardiovascular: RRR no MRGs, with 2+ bilateral peripheral edema Respiratory: Normal respiratory effort, diminished breath sounds throughout Abdomen: Soft,non-tender,  Skin: No rash ulcers, or lesions. Without skin tenting  Neurologic:  Grossly no focal neuro deficit. Psychiatric: Anxious, alert to self  Data Reviewed: CBC: Recent Labs  Lab 04/15/18 0619  WBC 5.9  HGB 11.1*  HCT 35.2*  MCV 82.1  PLT 242   Basic Metabolic Panel: Recent Labs  Lab 04/15/18 0619 04/16/18 0834  NA 140 142  K 3.8 4.3  CL 108 108  CO2 19* 23  GLUCOSE 206* 135*  BUN 42* 55*  CREATININE 4.05* 4.37*  CALCIUM 8.4* 8.5*   GFR: Estimated Creatinine Clearance: 11 mL/min (A) (by C-G formula based on SCr  of 4.37 mg/dL (H)). Liver Function Tests: No results for input(s): AST, ALT, ALKPHOS, BILITOT, PROT, ALBUMIN in the last 168 hours. No results for input(s): LIPASE, AMYLASE in the last 168 hours. No results for input(s): AMMONIA in the last 168 hours. Coagulation Profile: No results for input(s): INR, PROTIME in the last 168 hours. Cardiac Enzymes: Recent Labs  Lab 04/15/18 0619  TROPONINI 0.03*   BNP (last 3 results) No results for input(s): PROBNP in the last 8760 hours. HbA1C: No results for input(s): HGBA1C in the last 72 hours. CBG: Recent Labs  Lab 04/18/18 0741 04/18/18 1116 04/18/18 1627 04/19/18 0039 04/19/18 0804  GLUCAP 94 85 87 62* 88   Lipid Profile: No results for input(s): CHOL, HDL, LDLCALC, TRIG, CHOLHDL, LDLDIRECT in the last 72 hours. Thyroid Function Tests: No results for input(s): TSH, T4TOTAL, FREET4, T3FREE, THYROIDAB in the last 72 hours. Anemia Panel: No results for input(s): VITAMINB12, FOLATE, FERRITIN, TIBC, IRON, RETICCTPCT in the last 72 hours. Urine analysis:    Component Value Date/Time   COLORURINE YELLOW 04/15/2018 2200   APPEARANCEUR CLEAR 04/21/2018 2200   LABSPEC 1.022 04/15/2018 2200   PHURINE 6.0 04/24/2018 2200   GLUCOSEU NEGATIVE 04/07/2018 2200   HGBUR NEGATIVE 04/06/2018 2200   BILIRUBINUR NEGATIVE 04/24/2018 2200   KETONESUR 5 (A) 04/04/2018 2200   PROTEINUR >=300 (A) 04/01/2018 2200   UROBILINOGEN 1.0 06/06/2012 1203   NITRITE NEGATIVE 04/12/2018 2200   LEUKOCYTESUR NEGATIVE 04/19/2018 2200   Sepsis Labs: @LABRCNTIP (procalcitonin:4,lacticidven:4)  ) Recent Results (from the past 240 hour(s))  Culture, blood (routine x 2) Call MD if unable to obtain prior to antibiotics being given     Status: None   Collection Time: 04/02/2018 11:21 PM  Result Value Ref Range Status   Specimen Description   Final    BLOOD LEFT FOREARM Performed at [facility], [address] [place], [place] [address]     Special Requests   Final    BOTTLES DRAWN AEROBIC ONLY Blood Culture results may not be optimal due to an inadequate volume of blood received in culture bottles Performed at [facility], [address], [place], [place] [address]    Culture   Final    NO GROWTH 5 DAYS Performed at [facility], [address] [place], [place] [address]    Report Status 04/19/2018 FINAL  Final  Culture, blood (routine x 2) Call MD if unable to obtain prior to antibiotics being given     Status: None   Collection Time: 04/11/2018 11:21 PM  Result Value Ref Range Status   Specimen Description   Final    BLOOD LEFT HAND Performed at [facility], [address], [place], [place] [address]    Special Requests   Final    BOTTLES DRAWN AEROBIC ONLY Blood Culture adequate volume Performed at [facility], [address], [place], [place] [address]  Culture   Final    NO GROWTH 5 DAYS Performed at [facility], [address] [place], [place] [address]    Report Status 04/19/2018 FINAL  Final  Respiratory Panel by PCR     Status: None   Collection Time: 04/14/18 12:03 AM  Result Value Ref Range Status   Adenovirus NOT DETECTED NOT DETECTED Final   Coronavirus 229E NOT DETECTED NOT DETECTED Final   Coronavirus HKU1 NOT DETECTED NOT DETECTED Final   Coronavirus NL63 NOT DETECTED NOT DETECTED Final   Coronavirus OC43 NOT DETECTED NOT DETECTED Final   Metapneumovirus NOT DETECTED NOT DETECTED Final   Rhinovirus / Enterovirus NOT DETECTED NOT DETECTED Final   Influenza A NOT DETECTED NOT DETECTED Final   Influenza B NOT DETECTED NOT DETECTED Final   Parainfluenza Virus 1 NOT DETECTED NOT DETECTED Final   Parainfluenza Virus 2 NOT DETECTED NOT DETECTED Final   Parainfluenza Virus 3 NOT DETECTED NOT DETECTED Final   Parainfluenza Virus 4 NOT DETECTED NOT DETECTED Final   Respiratory Syncytial Virus NOT DETECTED NOT DETECTED Final    Bordetella pertussis NOT DETECTED NOT DETECTED Final   Chlamydophila pneumoniae NOT DETECTED NOT DETECTED Final   Mycoplasma pneumoniae NOT DETECTED NOT DETECTED Final    Comment: Performed at [facility], [address], [place], [place] [address]      Studies: No results found.  Scheduled Meds: . arformoterol  15 mcg Nebulization BID  . budesonide (PULMICORT) nebulizer solution  0.5 mg Nebulization BID  . guaiFENesin  600 mg Oral BID  . heparin injection (subcutaneous)  5,000 Units Subcutaneous Q8H  . leflunomide  10 mg Oral Daily  . polyethylene glycol  17 g Oral Daily  . verapamil  240 mg Oral QHS    Continuous Infusions:   LOS: 8 days     Brianna Peace, MD [facility] Pager [phone]  If 7PM-7AM, please contact night-coverage [email or web address] Password [email or web address] 04/21/2018, 11:18 PM

## 2018-04-21 NOTE — Progress Notes (Signed)
Pt has refused breathing treatments x 3 days now.

## 2018-04-22 LAB — GLUCOSE, CAPILLARY: Glucose-Capillary: 67 mg/dL — ABNORMAL LOW (ref 70–99)

## 2018-04-22 NOTE — Progress Notes (Signed)
PROGRESS NOTE  [name] VVO:[id number] DOB: 11/29/1934 DOA: 04/06/2018 PCP: Brianna Heron, MD  HPI/Brief Narrative  [name] is a 83 y.o. year old female with medical history significant for  hypertension, COPD/ILD, on home oxygen, rheumatoid arthritis on Humira, psoriasis, GERD who presented on 04/05/2018 with  worsening shortness of breath for last 3 to 4 days and was found to have hypoxia secondary to COPD exacerbation and acute worsening of chronic stage 3 CKD. Given uremia patient was transferred to [facility] and  nephrology was consulted on 9/16 however patient declined dialysis.  Hospital stay further complicated by patient declining SNF placement however psychiatry team patient does not have capacity as she is unaware of why SNF placement was recommended and would need authorized representatives to make decisions.  Tentative plan was for patient to go home with hospice however this is on hold now as son wanted patient discharged home soon as possible however no safe discharge plan in place to ensure patient has 24-hour assistance whether she goes home home with hospice or not.  Son Mr. [name] has displayed paranoid behavior with medical staff and team and has not been clear that he will provide 24 hour care for his mom especially in light of her declining kidney function. Currently as her next of kin he is her HPOA so awaiting case management assistance and ethics consult.  Regarding SOB, initial concern for PE on presentation  CXR and V/q scan were both negative.   Subjective No acute complaints today.   Assessment/Plan:  AKI on CKD, now progressive CKD, worsening. Signs of uremia and volume overload on exam. Most recent Cr prior to admission was 3.2 in 11/2017 ( baseline 1.4 for several years), presented with Cr of 4. Declining all blood draws. Plan to go home with hospice care however no safe discharge plan with inability to reach and discuss with son , Mr. [name].    Acute on chronic hypoxic respiratory failure, likely multifactorial. COPD exacerbation in setting of likely ILD related to RA. Stable on supplemental oxygen. Previous on admission PE would out with low probability V/Q scan.    Continue on Pulmicort and Brovana.  Mucinex for supportive care  RA, stable. continue leflunomide  Hypertension, at goal.  Continue home verapamil  Significant lower extremity edema related to fluid overload, likely secondary to worsening kidney dysfunction.  Venous duplex negative for DVT  Hypoglycemia patient has poor oral intake.  Refusing all lab draws  Deconditioning worsening debility PT recommends SNF, patient declining however does not capacity to decline.  Completed social as next week and his son has been difficult to reach and has asked for patient be discharged home Code Status: DNR  Family Communication: Mr. [name] has been spoken to by many on medical team but has displayed paranoid behavior and no confirmation of providing 24 hour care. (cell phone number [phone]) he declines physician speaking to him on phone but has not been to hospital  Disposition Plan: Social work discussed with APS, will need ethics consult given HPOA doesn't seen sound  Consultants:  Nephrology, palliative care       Antimicrobials: Anti-infectives (From admission, onward)   Start     Dose/Rate Route Frequency Ordered Stop   04/10/2018 2330  ceFEPIme (MAXIPIME) 1 g in sodium chloride 0.9 % 100 mL IVPB  Status:  Discontinued     1 g 200 mL/hr over 30 Minutes Intravenous Daily at bedtime 04/24/2018 2308 04/15/18 1116  Telemetry: None, patient refused  DVT prophylaxis: Heparin   Objective: Vitals:   04/20/18 0500 04/20/18 1957 04/22/18 0500 04/22/18 0549  BP:    (!) 160/83  Pulse:  100  97  Resp:  20  18  Temp:  98 F (36.7 C)  97.7 F (36.5 C)  TempSrc:  Oral  Oral  SpO2:  98%  100%  Weight: 77.1 kg  77.1 kg   Height:        Intake/Output  Summary (Last 24 hours) at 04/22/2018 1329 Last data filed at 04/22/2018 1027 Gross per 24 hour  Intake 180 ml  Output 126 ml  Net 54 ml   Filed Weights   04/17/18 0500 04/20/18 0500 04/22/18 0500  Weight: 76 kg 77.1 kg 77.1 kg    Exam:  Constitutional: Elderly female, no distress Eyes: EOMI, anicteric, normal conjunctivae Cardiovascular: RRR no MRGs, with 2+ bilateral peripheral pitting edema Respiratory: Normal respiratory effort, diminished breath sounds throughout with crackles at bases Abdomen: Soft,non-tender,  Skin: No rash ulcers, or lesions. Without skin tenting  Neurologic: Grossly no focal neuro deficit. Psychiatric: Anxious, alert to self  Data Reviewed: CBC: No results for input(s): WBC, NEUTROABS, HGB, HCT, MCV, PLT in the last 168 hours. Basic Metabolic Panel: Recent Labs  Lab 04/16/18 0834  NA 142  K 4.3  CL 108  CO2 23  GLUCOSE 135*  BUN 55*  CREATININE 4.37*  CALCIUM 8.5*   GFR: Estimated Creatinine Clearance: 11 mL/min (A) (by C-G formula based on SCr of 4.37 mg/dL (H)). Liver Function Tests: No results for input(s): AST, ALT, ALKPHOS, BILITOT, PROT, ALBUMIN in the last 168 hours. No results for input(s): LIPASE, AMYLASE in the last 168 hours. No results for input(s): AMMONIA in the last 168 hours. Coagulation Profile: No results for input(s): INR, PROTIME in the last 168 hours. Cardiac Enzymes: No results for input(s): CKTOTAL, CKMB, CKMBINDEX, TROPONINI in the last 168 hours. BNP (last 3 results) No results for input(s): PROBNP in the last 8760 hours. HbA1C: No results for input(s): HGBA1C in the last 72 hours. CBG: Recent Labs  Lab 04/18/18 1116 04/18/18 1627 04/19/18 0039 04/19/18 0804 04/22/18 0549  GLUCAP 85 87 62* 88 67*   Lipid Profile: No results for input(s): CHOL, HDL, LDLCALC, TRIG, CHOLHDL, LDLDIRECT in the last 72 hours. Thyroid Function Tests: No results for input(s): TSH, T4TOTAL, FREET4, T3FREE, THYROIDAB in the last  72 hours. Anemia Panel: No results for input(s): VITAMINB12, FOLATE, FERRITIN, TIBC, IRON, RETICCTPCT in the last 72 hours. Urine analysis:    Component Value Date/Time   COLORURINE YELLOW 04/08/2018 2200   APPEARANCEUR CLEAR 04/03/2018 2200   LABSPEC 1.022 04/12/2018 2200   PHURINE 6.0 04/07/2018 2200   GLUCOSEU NEGATIVE 04/02/2018 2200   HGBUR NEGATIVE 04/03/2018 2200   BILIRUBINUR NEGATIVE 04/30/2018 2200   KETONESUR 5 (A) 04/07/2018 2200   PROTEINUR >=300 (A) 04/05/2018 2200   UROBILINOGEN 1.0 06/06/2012 1203   NITRITE NEGATIVE 04/22/2018 2200   LEUKOCYTESUR NEGATIVE 04/03/2018 2200   Sepsis Labs: @LABRCNTIP (procalcitonin:4,lacticidven:4)  ) Recent Results (from the past 240 hour(s))  Culture, blood (routine x 2) Call MD if unable to obtain prior to antibiotics being given     Status: None   Collection Time: 04/20/2018 11:21 PM  Result Value Ref Range Status   Specimen Description   Final    BLOOD LEFT FOREARM Performed at [facility], [address] [place], [place] [address]    Special Requests   Final  BOTTLES DRAWN AEROBIC ONLY Blood Culture results may not be optimal due to an inadequate volume of blood received in culture bottles Performed at [facility], [address] [place], [place] [address]    Culture   Final    NO GROWTH 5 DAYS Performed at [facility], [address] [place], [place] [address]    Report Status 04/19/2018 FINAL  Final  Culture, blood (routine x 2) Call MD if unable to obtain prior to antibiotics being given     Status: None   Collection Time: 04/17/2018 11:21 PM  Result Value Ref Range Status   Specimen Description   Final    BLOOD LEFT HAND Performed at [facility], [address] [place], [place] [address]    Special Requests   Final    BOTTLES DRAWN AEROBIC ONLY Blood Culture adequate volume Performed at [facility], [address]  [place], [place] [address]    Culture   Final    NO GROWTH 5 DAYS Performed at [facility], [address], [place], [place] [address]    Report Status 04/19/2018 FINAL  Final  Respiratory Panel by PCR     Status: None   Collection Time: 04/14/18 12:03 AM  Result Value Ref Range Status   Adenovirus NOT DETECTED NOT DETECTED Final   Coronavirus 229E NOT DETECTED NOT DETECTED Final   Coronavirus HKU1 NOT DETECTED NOT DETECTED Final   Coronavirus NL63 NOT DETECTED NOT DETECTED Final   Coronavirus OC43 NOT DETECTED NOT DETECTED Final   Metapneumovirus NOT DETECTED NOT DETECTED Final   Rhinovirus / Enterovirus NOT DETECTED NOT DETECTED Final   Influenza A NOT DETECTED NOT DETECTED Final   Influenza B NOT DETECTED NOT DETECTED Final   Parainfluenza Virus 1 NOT DETECTED NOT DETECTED Final   Parainfluenza Virus 2 NOT DETECTED NOT DETECTED Final   Parainfluenza Virus 3 NOT DETECTED NOT DETECTED Final   Parainfluenza Virus 4 NOT DETECTED NOT DETECTED Final   Respiratory Syncytial Virus NOT DETECTED NOT DETECTED Final   Bordetella pertussis NOT DETECTED NOT DETECTED Final   Chlamydophila pneumoniae NOT DETECTED NOT DETECTED Final   Mycoplasma pneumoniae NOT DETECTED NOT DETECTED Final    Comment: Performed at [facility], [address], [place], [place] [address]      Studies: No results found.  Scheduled Meds: . arformoterol  15 mcg Nebulization BID  . budesonide (PULMICORT) nebulizer solution  0.5 mg Nebulization BID  . guaiFENesin  600 mg Oral BID  . heparin injection (subcutaneous)  5,000 Units Subcutaneous Q8H  . leflunomide  10 mg Oral Daily  . polyethylene glycol  17 g Oral Daily  . verapamil  240 mg Oral QHS    Continuous Infusions:   LOS: 9 days     Laverna Peace, MD [facility] Pager [phone]  If 7PM-7AM, please contact night-coverage [email or web address] Password [facility] 04/22/2018, 1:29 PM

## 2018-04-22 NOTE — Progress Notes (Signed)
Patient refused all her medications.

## 2018-04-22 NOTE — Progress Notes (Signed)
Pt refusing meds, vitals, and CBG. Pt keeps trying to get OOB.  Larey Days, RN

## 2018-04-23 NOTE — Progress Notes (Signed)
Patient refused HS meds,vital signs and CBG checks. [name], Drinda Butts, Charity fundraiser

## 2018-04-23 NOTE — Clinical Social Work Note (Signed)
Adult Protective Services SW, Adriene [place] came to hospital today to see patient, but was unable to as a [facility] staff person was in the room. Patient was discussed, along with family; niece who was providing care and son. Ms. Drema Dallas requested that CSW contact niece regarding situation and possibility of APS seeking guardianship of patient. Ms. Kyla Balzarine advised CSW that she will attempt to see patient Tuesday.  Genelle Bal, MSW, LCSW Licensed Clinical Social Worker Clinical Social Work Department [employer] [phone]

## 2018-04-23 NOTE — Consult Note (Signed)
[facility] We have not reached out to the son due to his paranoid behavior and per SW note he is not interested in hospice care for his mom. We are willing to follow in home if pt has a 24 hour caregiver. Our hospice agency is not willing to take the pt if there is no concrete caregiver in home- It is felt pt is unsafe to be at home alone due to her not having the capacity to make decisions, confusion and weakness.  Please reach out to [place] if we can be of any assistance. You may give our number to the son and if he decides or changes his mind about hospice care then he can reach out to [place] once pt is discharged. Norm Parcel RN [phone] Hospital Liaison

## 2018-04-23 NOTE — Progress Notes (Signed)
PROGRESS NOTE  GERARDINE BAND KGM:[id number] DOB: 01/22/1935 DOA: 04/15/2018 PCP: Clayborn Heron, MD  HPI/Brief Narrative  [name] is a 83 y.o. year old female with medical history significant for  hypertension, COPD/ILD, on home oxygen, rheumatoid arthritis on Humira, psoriasis, GERD who presented on 04/27/2018 with  worsening shortness of breath for last 3 to 4 days and was found to have hypoxia secondary to COPD exacerbation and acute worsening of chronic stage 3 CKD. Given uremia patient was transferred to [facility] and  nephrology was consulted on 9/16 however patient declined dialysis.  Hospital stay further complicated by patient declining SNF placement however psychiatry team patient does not have capacity as she is unaware of why SNF placement was recommended and would need authorized representatives to make decisions.  Tentative plan was for patient to go home with hospice however this is on hold now as son wanted patient discharged home soon as possible however no safe discharge plan in place to ensure patient has 24-hour assistance whether she goes home home with hospice or not.  [name] has displayed paranoid behavior with medical staff and team and has not been clear that he will provide 24 hour care for his mom especially in light of her declining kidney function. Currently as her next of kin he is her HPOA so awaiting case management assistance and ethics consult.  Regarding SOB, initial concern for PE on presentation  CXR and V/q scan were both negative.   Subjective No acute complaints today.   Assessment/Plan:  AKI on CKD, now progressive CKD, worsening. Signs of uremia and volume overload on exam. Most recent Cr prior to admission was 3.2 in 11/2017 ( baseline 1.4 for several years), presented with Cr of 4. Declining all blood draws. Plan to go home with hospice care however no safe discharge plan with inability to reach and discuss with son , Mr. Daphine Deutscher to  ensure 24 H assistance as patient has no capacity. Ethics committee was consulted last week and recommended getting adult protective services involved. Awaiting dispo from social worker  Acute on chronic hypoxic respiratory failure, likely multifactorial. COPD exacerbation in setting of likely ILD related to RA. Stable on supplemental oxygen. Previous on admission PE would out with low probability V/Q scan.    Continue on Pulmicort and Brovana.  Mucinex for supportive care  RA, stable. continue leflunomide  Hypertension, at goal.  Continue home verapamil  Significant lower extremity edema related to fluid overload, likely secondary to worsening kidney dysfunction.  Venous duplex negative for DVT  Hypoglycemia patient has poor oral intake.  Refusing all lab draws  Deconditioning worsening debility PT recommends SNF, patient declining however does not capacity to decline.  Completed social as next week and his son has been difficult to reach and has asked for patient be discharged home Code Status: DNR  Family Communication: Mr. Daphine Deutscher has been spoken to by many on medical team but has displayed paranoid behavior and no confirmation of providing 24 hour care. (cell phone number [phone]) he declines physician speaking to him on phone but has not been to hospital  Disposition Plan: Social work discussed with APS,  Camera operator already consulted given concerns HPOA doesn't seen sound  Consultants:  Nephrology, palliative care       Antimicrobials: Anti-infectives (From admission, onward)   Start     Dose/Rate Route Frequency Ordered Stop   04/10/2018 2330  ceFEPIme (MAXIPIME) 1 g in sodium chloride 0.9 % 100  mL IVPB  Status:  Discontinued     1 g 200 mL/hr over 30 Minutes Intravenous Daily at bedtime 04/08/2018 2308 04/15/18 1116         Telemetry: None, patient refused  DVT prophylaxis: Heparin   Objective: Vitals:   04/22/18 0549 04/22/18 0838 04/22/18 2043 04/23/18  0500  Pulse: 97     Resp: 18     Temp: 97.7 F (36.5 C)     TempSrc: Oral     SpO2: 100% 99% 99%   Weight:    77.1 kg  Height:        Intake/Output Summary (Last 24 hours) at 04/23/2018 2330 Last data filed at 04/23/2018 1300 Gross per 24 hour  Intake 60 ml  Output 0 ml  Net 60 ml   Filed Weights   04/20/18 0500 04/22/18 0500 04/23/18 0500  Weight: 77.1 kg 77.1 kg 77.1 kg    Exam:  Constitutional: Elderly female, no distress Eyes: EOMI, anicteric, normal conjunctivae Cardiovascular: RRR no MRGs, with 2+ bilateral peripheral pitting edema Respiratory: Normal respiratory effort on 4 L [place] O2, diminished breath sounds throughout with crackles at bases Abdomen: Soft,non-tender,  Skin: No rash ulcers, or lesions. Without skin tenting  Neurologic: Grossly no focal neuro deficit. Psychiatric: Anxious, alert to self  Data Reviewed: CBC: No results for input(s): WBC, NEUTROABS, HGB, HCT, MCV, PLT in the last 168 hours. Basic Metabolic Panel: No results for input(s): NA, K, CL, CO2, GLUCOSE, BUN, CREATININE, CALCIUM, MG, PHOS in the last 168 hours. GFR: Estimated Creatinine Clearance: 11 mL/min (A) (by C-G formula based on SCr of 4.37 mg/dL (H)). Liver Function Tests: No results for input(s): AST, ALT, ALKPHOS, BILITOT, PROT, ALBUMIN in the last 168 hours. No results for input(s): LIPASE, AMYLASE in the last 168 hours. No results for input(s): AMMONIA in the last 168 hours. Coagulation Profile: No results for input(s): INR, PROTIME in the last 168 hours. Cardiac Enzymes: No results for input(s): CKTOTAL, CKMB, CKMBINDEX, TROPONINI in the last 168 hours. BNP (last 3 results) No results for input(s): PROBNP in the last 8760 hours. HbA1C: No results for input(s): HGBA1C in the last 72 hours. CBG: Recent Labs  Lab 04/18/18 1116 04/18/18 1627 04/19/18 0039 04/19/18 0804 04/22/18 0549  GLUCAP 85 87 62* 88 67*   Lipid Profile: No results for input(s): CHOL, HDL, LDLCALC,  TRIG, CHOLHDL, LDLDIRECT in the last 72 hours. Thyroid Function Tests: No results for input(s): TSH, T4TOTAL, FREET4, T3FREE, THYROIDAB in the last 72 hours. Anemia Panel: No results for input(s): VITAMINB12, FOLATE, FERRITIN, TIBC, IRON, RETICCTPCT in the last 72 hours. Urine analysis:    Component Value Date/Time   COLORURINE YELLOW 04/04/2018 2200   APPEARANCEUR CLEAR 04/15/2018 2200   LABSPEC 1.022 04/21/2018 2200   PHURINE 6.0 04/01/2018 2200   GLUCOSEU NEGATIVE 04/10/2018 2200   HGBUR NEGATIVE 04/06/2018 2200   BILIRUBINUR NEGATIVE 04/19/2018 2200   KETONESUR 5 (A) 04/01/2018 2200   PROTEINUR >=300 (A) 04/19/2018 2200   UROBILINOGEN 1.0 06/06/2012 1203   NITRITE NEGATIVE 04/29/2018 2200   LEUKOCYTESUR NEGATIVE 04/05/2018 2200   Sepsis Labs: @LABRCNTIP (procalcitonin:4,lacticidven:4)  ) Recent Results (from the past 240 hour(s))  Respiratory Panel by PCR     Status: None   Collection Time: 04/14/18 12:03 AM  Result Value Ref Range Status   Adenovirus NOT DETECTED NOT DETECTED Final   Coronavirus 229E NOT DETECTED NOT DETECTED Final   Coronavirus HKU1 NOT DETECTED NOT DETECTED Final   Coronavirus NL63 NOT DETECTED NOT  DETECTED Final   Coronavirus OC43 NOT DETECTED NOT DETECTED Final   Metapneumovirus NOT DETECTED NOT DETECTED Final   Rhinovirus / Enterovirus NOT DETECTED NOT DETECTED Final   Influenza A NOT DETECTED NOT DETECTED Final   Influenza B NOT DETECTED NOT DETECTED Final   Parainfluenza Virus 1 NOT DETECTED NOT DETECTED Final   Parainfluenza Virus 2 NOT DETECTED NOT DETECTED Final   Parainfluenza Virus 3 NOT DETECTED NOT DETECTED Final   Parainfluenza Virus 4 NOT DETECTED NOT DETECTED Final   Respiratory Syncytial Virus NOT DETECTED NOT DETECTED Final   Bordetella pertussis NOT DETECTED NOT DETECTED Final   Chlamydophila pneumoniae NOT DETECTED NOT DETECTED Final   Mycoplasma pneumoniae NOT DETECTED NOT DETECTED Final    Comment: Performed at [facility], [address], [place], [place] [address]      Studies: No results found.  Scheduled Meds: . arformoterol  15 mcg Nebulization BID  . budesonide (PULMICORT) nebulizer solution  0.5 mg Nebulization BID  . guaiFENesin  600 mg Oral BID  . heparin injection (subcutaneous)  5,000 Units Subcutaneous Q8H  . leflunomide  10 mg Oral Daily  . polyethylene glycol  17 g Oral Daily  . verapamil  240 mg Oral QHS    Continuous Infusions:   LOS: 10 days     Laverna Peace, MD [facility] Pager [phone]  If 7PM-7AM, please contact night-coverage [email or web address] Password [facility] 04/23/2018, 11:30 PM

## 2018-04-23 NOTE — Progress Notes (Signed)
Physical Therapy Treatment Patient Details Name: [name] MRN: [id number] DOB: 02-09-1935 Today's Date: 04/23/2018    History of Present Illness Pt is an 83 year old woman admitted with COPD exacerbation with hypoxia. Pt is with renal dysfunction, and is considering declining HD; Difficult dc plan as she does not have assistance at home; PMH: RA, HTN, interstitial lung disease, neuropathy. Pt is on 3L 02 at home.    PT Comments    Continuing work on functional mobility and activity tolerance; Session focused on transfers, changing positions, and assisting [name] OOB to recliner; session condicuted on 4 L supplemental O2 and sat was 97% end of session; Noted the complexity of dc planning for [name]; From a physical functioning standpoint, she requires min to mod assistance with transfers, and has walked short distances -- continue to recommend SNF; throughout the session, she repeated how worried she is for her son.  Follow Up Recommendations  SNF;Supervision/Assistance - 24 hour     Equipment Recommendations  Other (comment)(may consider Rollator RW)    Recommendations for Other Services       Precautions / Restrictions Precautions Precautions: Fall Precaution Comments: chronic 3L O2 at rest, reports increase to 4L O2 with activity    Mobility  Bed Mobility Overal bed mobility: Needs Assistance Bed Mobility: Supine to Sit     Supine to sit: Min assist     General bed mobility comments: Min assist to help move her legs to EOB; used bed pad to square off hips at EOB  Transfers Overall transfer level: Needs assistance Equipment used: 1 person hand held assist Transfers: Sit to/from [employer] Sit to Stand: Mod assist Stand pivot transfers: Min assist       General transfer comment: Initial atempt without help at pt's request; Light mod assist to rise to stand; Cues for hand placement and to reach for far armrest to turn and take a seat;  decr control of descent to sit  Ambulation/Gait                 Stairs             Wheelchair Mobility    Modified Rankin (Stroke Patients Only)       Balance     Sitting balance-Leahy Scale: Good       Standing balance-Leahy Scale: Poor Standing balance comment: B UE support of walker and external assist needed                             Cognition Arousal/Alertness: Awake/alert Behavior During Therapy: Flat affect Overall Cognitive Status: No family/caregiver present to determine baseline cognitive functioning Area of Impairment: Safety/judgement;Problem solving                   Current Attention Level: Sustained Memory: Decreased short-term memory Following Commands: Follows one step commands with increased time     Problem Solving: Slow processing;Decreased initiation;Difficulty sequencing;Requires verbal cues        Exercises      General Comments General comments (skin integrity, edema, etc.): Session conducted on supplemental O2 4L; O2 sats 97% in the chair; significant BLE edema, doffed socks at end of session, and provided pt with warmed blankets (feet were cold)      Pertinent Vitals/Pain Pain Assessment: Faces Faces Pain Scale: Hurts a little bit Pain Location: bil calves while doffing socks Pain Descriptors / Indicators: Grimacing;Sore Pain Intervention(s): Monitored  during session    Home Living                      Prior Function            PT Goals (current goals can now be found in the care plan section) Acute Rehab PT Goals Patient Stated Goal: Did not state a goal this session, although she is agreeable to getting OOB to the chair PT Goal Formulation: With patient Time For Goal Achievement: 05/07/18 Potential to Achieve Goals: Fair Progress towards PT goals: Progressing toward goals(Slowly; goals set 9/15 continue to be appropriate)    Frequency    Min 2X/week      PT Plan Current  plan remains appropriate    Co-evaluation              AM-PAC PT "6 Clicks" Daily Activity  Outcome Measure  Difficulty turning over in bed (including adjusting bedclothes, sheets and blankets)?: A Lot Difficulty moving from lying on back to sitting on the side of the bed? : A Lot Difficulty sitting down on and standing up from a chair with arms (e.g., wheelchair, bedside commode, etc,.)?: Unable Help needed moving to and from a bed to chair (including a wheelchair)?: A Lot Help needed walking in hospital room?: A Little Help needed climbing 3-5 steps with a railing? : Total 6 Click Score: 11    End of Session Equipment Utilized During Treatment: Gait belt;Oxygen Activity Tolerance: Patient limited by fatigue Patient left: in chair;with call bell/phone within reach;with chair alarm set Nurse Communication: Mobility status PT Visit Diagnosis: Other abnormalities of gait and mobility (R26.89)     Time: 8756-4332 PT Time Calculation (min) (ACUTE ONLY): 31 min  Charges:  $Therapeutic Activity: 23-37 mins                     Van Clines, PT  Acute Rehabilitation Services Pager [phone] Office [phone]    Levi Aland 04/23/2018, 3:28 PM

## 2018-04-24 LAB — GLUCOSE, CAPILLARY: GLUCOSE-CAPILLARY: 90 mg/dL (ref 70–99)

## 2018-04-24 NOTE — Progress Notes (Signed)
PROGRESS NOTE  ATASHA COLEBANK PZW:[id number] DOB: 07-Nov-1934 DOA: 04/10/2018 PCP: Clayborn Heron, MD  HPI/Brief Narrative  OLIA HINDERLITER is a 83 y.o. year old female with medical history significant for  hypertension, COPD/ILD, on home oxygen, rheumatoid arthritis on Humira, psoriasis, GERD who presented on 04/02/2018 with  worsening shortness of breath for last 3 to 4 days and was found to have hypoxia secondary to COPD exacerbation and acute worsening of chronic stage 3 CKD. Given uremia patient was transferred to [facility] and  nephrology was consulted on 9/16 however patient declined dialysis.  Hospital stay further complicated by patient declining SNF placement however psychiatry team patient does not have capacity as she is unaware of why SNF placement was recommended and would need authorized representatives to make decisions.  Tentative plan was for patient to go home with hospice however this is on hold now as son wanted patient discharged home soon as possible however no safe discharge plan in place to ensure patient has 24-hour assistance whether she goes home home with hospice or not.  [name] has displayed paranoid behavior with medical staff and team and has not been clear that he will provide 24 hour care for his mom especially in light of her declining kidney function. Currently as her next of kin he is her HPOA so awaiting case management assistance and ethics consult.  Regarding SOB, initial concern for PE on presentation  CXR and V/q scan were both negative.   Subjective Ate small amount of lunch. No complaints other than wanting to go home   Assessment/Plan:  AKI on CKD, worsening. Signs of volume overload on exam. Most recent Cr prior to admission was 3.2 in 11/2017 ( baseline 1.4 for several years), presented with Cr of 4. Declining all blood draws. Initial plan to go home with hospice care however currently no safe discharge plan with inability to reach and  discuss with son , Mr. Daphine Deutscher to ensure 24 H assistance as patient has no capacity. Ethics committee was consulted last week and recommended getting adult protective services involved who may work on guardianship. Awaiting dispo from social worker  Acute on chronic hypoxic respiratory failure, likely multifactorial. COPD exacerbation in setting of likely ILD related to RA. Stable on supplemental oxygen. Previous on admission PE would out with low probability V/Q scan.    Continue on Pulmicort and Brovana.  Mucinex for supportive care  RA, stable. continue leflunomide  Hypertension, at goal.  Continue home verapamil  Significant lower extremity edema related to fluid overload, likely secondary to worsening kidney dysfunction.  Venous duplex negative for DVT  Hypoglycemia patient has poor oral intake.  Refusing all lab draws  Deconditioning worsening debility PT recommends SNF, patient declining however does not capacity to decline.  Completed social as next week and his son has been difficult to reach and has asked for patient be discharged home Code Status: DNR  Family Communication: Mr. Daphine Deutscher has been spoken to by many on medical team but has displayed paranoid behavior and no confirmation of providing 24 hour care. (cell phone number [phone]) he declines physician speaking to him on phone but has not been to hospital  Disposition Plan: Social work discuss with APS,  Ethics committee already consulted given concerns HPOA doesn't seem sound  Consultants:  Nephrology, palliative care       Antimicrobials: Anti-infectives (From admission, onward)   Start     Dose/Rate Route Frequency Ordered Stop   04/17/2018 2330  ceFEPIme (MAXIPIME) 1 g in sodium chloride 0.9 % 100 mL IVPB  Status:  Discontinued     1 g 200 mL/hr over 30 Minutes Intravenous Daily at bedtime 04/28/2018 2308 04/15/18 1116         Telemetry: None, patient refused  DVT  prophylaxis: Heparin   Objective: Vitals:   04/24/18 0840 04/24/18 0915 04/24/18 0937 04/24/18 1934  BP:   110/60   Pulse:  70 62   Resp:   20   Temp:      TempSrc:      SpO2: 98% 98% 98% 99%  Weight:      Height:        Intake/Output Summary (Last 24 hours) at 04/24/2018 2157 Last data filed at 04/24/2018 1858 Gross per 24 hour  Intake 570 ml  Output 0 ml  Net 570 ml   Filed Weights   04/20/18 0500 04/22/18 0500 04/23/18 0500  Weight: 77.1 kg 77.1 kg 77.1 kg    Exam:  Constitutional: Elderly female, no distress Eyes: EOMI, anicteric, normal conjunctivae Cardiovascular: RRR no MRGs, with 2+ bilateral peripheral pitting edema Respiratory: Normal respiratory effort on 4 L [place] O2, diminished breath sounds throughout with crackles at bases Abdomen: Soft,non-tender,  Skin: No rash ulcers, or lesions. Without skin tenting  Neurologic: Grossly no focal neuro deficit. Psychiatric: Anxious, alert to self  Data Reviewed: CBC: No results for input(s): WBC, NEUTROABS, HGB, HCT, MCV, PLT in the last 168 hours. Basic Metabolic Panel: No results for input(s): NA, K, CL, CO2, GLUCOSE, BUN, CREATININE, CALCIUM, MG, PHOS in the last 168 hours. GFR: Estimated Creatinine Clearance: 11 mL/min (A) (by C-G formula based on SCr of 4.37 mg/dL (H)). Liver Function Tests: No results for input(s): AST, ALT, ALKPHOS, BILITOT, PROT, ALBUMIN in the last 168 hours. No results for input(s): LIPASE, AMYLASE in the last 168 hours. No results for input(s): AMMONIA in the last 168 hours. Coagulation Profile: No results for input(s): INR, PROTIME in the last 168 hours. Cardiac Enzymes: No results for input(s): CKTOTAL, CKMB, CKMBINDEX, TROPONINI in the last 168 hours. BNP (last 3 results) No results for input(s): PROBNP in the last 8760 hours. HbA1C: No results for input(s): HGBA1C in the last 72 hours. CBG: Recent Labs  Lab 04/18/18 1627 04/19/18 0039 04/19/18 0804 04/22/18 0549  04/24/18 0921  GLUCAP 87 62* 88 67* 90   Lipid Profile: No results for input(s): CHOL, HDL, LDLCALC, TRIG, CHOLHDL, LDLDIRECT in the last 72 hours. Thyroid Function Tests: No results for input(s): TSH, T4TOTAL, FREET4, T3FREE, THYROIDAB in the last 72 hours. Anemia Panel: No results for input(s): VITAMINB12, FOLATE, FERRITIN, TIBC, IRON, RETICCTPCT in the last 72 hours. Urine analysis:    Component Value Date/Time   COLORURINE YELLOW 04/11/2018 2200   APPEARANCEUR CLEAR 04/17/2018 2200   LABSPEC 1.022 04/28/2018 2200   PHURINE 6.0 04/15/2018 2200   GLUCOSEU NEGATIVE 04/24/2018 2200   HGBUR NEGATIVE 04/14/2018 2200   BILIRUBINUR NEGATIVE 04/20/2018 2200   KETONESUR 5 (A) 04/16/2018 2200   PROTEINUR >=300 (A) 04/15/2018 2200   UROBILINOGEN 1.0 06/06/2012 1203   NITRITE NEGATIVE 04/20/2018 2200   LEUKOCYTESUR NEGATIVE 04/27/2018 2200   Sepsis Labs: @LABRCNTIP (procalcitonin:4,lacticidven:4)  ) No results found for this or any previous visit (from the past 240 hour(s)).    Studies: No results found.  Scheduled Meds: . arformoterol  15 mcg Nebulization BID  . budesonide (PULMICORT) nebulizer solution  0.5 mg Nebulization BID  . guaiFENesin  600 mg Oral BID  .  heparin injection (subcutaneous)  5,000 Units Subcutaneous Q8H  . leflunomide  10 mg Oral Daily  . polyethylene glycol  17 g Oral Daily  . verapamil  240 mg Oral QHS    Continuous Infusions:   LOS: 11 days     Laverna Peace, MD [facility] Pager [phone]  If 7PM-7AM, please contact night-coverage [email or web address] Password [facility] 04/24/2018, 9:57 PM

## 2018-04-24 NOTE — Progress Notes (Signed)
No charge note.  Discussed in Palliative Interdisciplinary Rounds.  Chart reviewed.  Discussed with Dr. Caleb Popp.  Will discontinue consult at this time.  Palliative previously consulted this admission.  Patient is currently comfortable.    Please consult [place] again for GOC if a legal guardian is assigned.  Norvel Richards, PA-C Palliative Medicine Pager: [phone]

## 2018-04-24 NOTE — Progress Notes (Signed)
Physical Therapy Treatment Patient Details Name: [name] MRN: [id number] DOB: [birth year] Today's Date: 04/24/2018    History of Present Illness Pt is an 83 year old woman admitted with COPD exacerbation with hypoxia. Pt is with renal dysfunction, and is considering declining HD; Difficult dc plan as she does not have assistance at home; PMH: RA, HTN, interstitial lung disease, neuropathy. Pt is on 3L 02 at home.    PT Comments    Continuing work on functional mobility and activity tolerance;  Seen pt at request of RN, who has noticed a functional decline over the past week; Last week, she was able to walk to the bathroom, and yesterday and today she lacked the strength and endurance for ambulation; Today, she was less able to participate than yesterday; Given kidney dysfunction, and that she will likely not start HD, I'm not sure of her rehab potential -- will continue to monitor functional status; She still needs SNF level of care   Follow Up Recommendations  SNF;Supervision/Assistance - 24 hour     Equipment Recommendations  Rolling walker with 5" wheels;3in1 (PT);Hospital bed    Recommendations for Other Services       Precautions / Restrictions Precautions Precautions: Fall Precaution Comments: chronic 3L O2 at rest, reports increase to 4L O2 with activity    Mobility  Bed Mobility Overal bed mobility: Needs Assistance Bed Mobility: Supine to Sit     Supine to sit: Mod assist     General bed mobility comments: Heavy mod assist to elevate trunk to sitting; noted grimace with moving LEs to EOB in prep for getting up as well  Transfers Overall transfer level: Needs assistance Equipment used: Rolling walker (2 wheeled);2 person hand held assist Transfers: Sit to/from [employer] Sit to Stand: Mod assist Stand pivot transfers: +2 physical assistance;Max assist       General transfer comment: Stood x2 from elevated bed to RW; Light mod assist to  power up, noted tending to lean backs of LEs against bed; significantly decr endurance/standing tolerance, and sat back to bed without warning; Opted for dependent basic pivot OOB to recliner with close fuard of knees for safety and stabiltiy  Ambulation/Gait                 Stairs             Wheelchair Mobility    Modified Rankin (Stroke Patients Only)       Balance     Sitting balance-Leahy Scale: Good       Standing balance-Leahy Scale: Poor                              Cognition Arousal/Alertness: (Sleepy, but arousable) Behavior During Therapy: Flat affect Overall Cognitive Status: No family/caregiver present to determine baseline cognitive functioning                                 General Comments: Sleepy and tending to close eyes and lean on elbows while sitting EOB; arousable; Much less talkative than last session; eyes closed approx 30% of session, but easily aroused      Exercises      General Comments General comments (skin integrity, edema, etc.): Session conducted on supplemental O2 4L; O2 sats 97% in the chair; significant BLE edema, doffed socks at end of session, and provided pt with warmed blankets (  feet were cold)      Pertinent Vitals/Pain Pain Assessment: Faces Faces Pain Scale: Hurts little more Pain Location: bil calves while doffing socks Pain Descriptors / Indicators: Grimacing;Sore Pain Intervention(s): Monitored during session    Home Living                      Prior Function            PT Goals (current goals can now be found in the care plan section) Acute Rehab PT Goals Patient Stated Goal: Did not state a goal this session, although she is agreeable to getting OOB to the chair PT Goal Formulation: With patient Time For Goal Achievement: 05/07/18 Potential to Achieve Goals: Fair Progress towards PT goals: Not progressing toward goals - comment(sleepy, decr endurance)     Frequency    Min 2X/week      PT Plan Current plan remains appropriate    Co-evaluation              AM-PAC PT "6 Clicks" Daily Activity  Outcome Measure  Difficulty turning over in bed (including adjusting bedclothes, sheets and blankets)?: A Lot Difficulty moving from lying on back to sitting on the side of the bed? : Unable Difficulty sitting down on and standing up from a chair with arms (e.g., wheelchair, bedside commode, etc,.)?: Unable Help needed moving to and from a bed to chair (including a wheelchair)?: A Lot Help needed walking in hospital room?: A Lot Help needed climbing 3-5 steps with a railing? : Total 6 Click Score: 9    End of Session Equipment Utilized During Treatment: Gait belt;Oxygen Activity Tolerance: Patient limited by fatigue Patient left: in chair;with call bell/phone within reach;with chair alarm set Nurse Communication: Mobility status;Need for lift equipment PT Visit Diagnosis: Other abnormalities of gait and mobility (R26.89)     Time: 9381-0175 PT Time Calculation (min) (ACUTE ONLY): 22 min  Charges:  $Therapeutic Activity: 8-22 mins                     Van Clines, PT  Acute Rehabilitation Services Pager [phone] Office [phone]    Levi Aland 04/24/2018, 12:45 PM

## 2018-04-25 DIAGNOSIS — N170 Acute kidney failure with tubular necrosis: Secondary | ICD-10-CM

## 2018-04-25 DIAGNOSIS — N184 Chronic kidney disease, stage 4 (severe): Secondary | ICD-10-CM

## 2018-04-25 LAB — GLUCOSE, CAPILLARY: GLUCOSE-CAPILLARY: 108 mg/dL — AB (ref 70–99)

## 2018-04-26 NOTE — Progress Notes (Signed)
Family at bedside. 

## 2018-05-01 NOTE — Progress Notes (Signed)
RN and tech walked into room to clean patient when she was found to be deceased- (20 mins earlier patient was moaning while nurse did assessment). Patient was pronounced deceased at 11:00pm tonight.  Second nurse, charge nurse,  Verified. NP Craige Cotta paged.

## 2018-05-01 NOTE — Progress Notes (Signed)
[facility]                                                                              Patient Demographics  [name], is a 83 y.o. female, DOB - 08-Dec-1934, BCW:[id number]  Admit date - 04/03/2018   Admitting Physician Clydie Braun, MD  Outpatient Primary MD for the patient is Rankins, Fanny Dance, MD  Outpatient specialists:   LOS - 12  days   Medical records reviewed and are as summarized below:    Chief Complaint  Patient presents with  . Shortness of Breath  . Weakness       Brief summary   Patient is a 83 year old female with hypertension, COPD on home O2, RA on Humira, psoriasis, GERD, presented with worsening shortness of breath for the last 3 to 4 days prior to admission, was found to have COPD exacerbation and acute worsening of chronic CKD stage III.  Patient was transferred to [facility], nephrology was consulted on 9/16 however patient declined dialysis.  Hospital stay further complicated by patient declining skilled nursing facility, psychiatry team deemed patient not having capacity.  Plan was for patient to be discharged home with hospice however patient requires 24-hour assistance.  Patient's son however has displayed paranoid behavior, questioning if he will be able to provide 24-hour care for his mother. Chest x-ray, VQ scan negative for PE.   Assessment & Plan    Principal Problem: Acute on chronic CKD stage III, worsening -Patient presented with shortness of breath and volume overload.  Creatinine on admission 4.01, previously 1.6 on 08/04/2016 -Nephrology was consulted, patient declined hemodialysis -Palliative care was consulted.  Acute respiratory failure with hypoxia -Likely secondary to mild COPD exacerbation, interstitial lung disease -Continue bronchodilators, O2 to keep sats above 90%  Rheumatoid arthritis -Continue Arava  Hypertension Currently stable, continue verapamil  Lower extremity edema -Likely due to #1,  fluid overload -Venous Dopplers negative for DVT  Failure to thrive, progressively worsening CKD, debility -Initial plan was to go home with hospice however no safe discharge plan was reached. -Ethics committee was consulted last week and APS was involved. -Social work Conservation officer, nature with disposition  Code Status: dnr  DVT Prophylaxis: Heparin subcu Family Communication: Discussed in detail with the patient, all imaging results, lab results explained to the patient    Disposition Plan: Disposition unclear  Time Spent in minutes 30 minutes  Procedures:  None  Consultants:   Nephrology Palliative medicine  Antimicrobials:      Medications  Scheduled Meds: . arformoterol  15 mcg Nebulization BID  . budesonide (PULMICORT) nebulizer solution  0.5 mg Nebulization BID  . guaiFENesin  600 mg Oral BID  . heparin injection (subcutaneous)  5,000 Units Subcutaneous Q8H  . leflunomide  10 mg Oral Daily  . polyethylene glycol  17 g Oral Daily  . verapamil  240 mg Oral QHS   Continuous Infusions: PRN Meds:.acetaminophen **OR** acetaminophen, hydrALAZINE, ipratropium-albuterol, MUSCLE RUB, ondansetron (ZOFRAN) IV, oxyCODONE-acetaminophen, phenol, technetium albumin aggregated, technetium TC 63M diethylenetriame-pentaacetic acid   Antibiotics   Anti-infectives (From admission, onward)   Start     Dose/Rate Route Frequency Ordered Stop  04/03/2018 2330  ceFEPIme (MAXIPIME) 1 g in sodium chloride 0.9 % 100 mL IVPB  Status:  Discontinued     1 g 200 mL/hr over 30 Minutes Intravenous Daily at bedtime 04/12/2018 2308 04/15/18 1116        Subjective:   [name] was seen and examined today.  No acute complaints.  No acute issues overnight.  No fevers or chills, nausea vomiting. Patient denies dizziness, chest pain, shortness of breath, abdominal pain, N/V/D/C.   Objective:   Vitals:   04/24/18 1934 05/05/2018 0834 2018/05/05 0838 05/05/2018 1235  BP:    97/60  Pulse:  91  79  Resp:   18  18  Temp:    98.2 F (36.8 C)  TempSrc:    Oral  SpO2: 99% 99% 99% 95%  Weight:      Height:        Intake/Output Summary (Last 24 hours) at 2018/05/05 1427 Last data filed at 2018-05-05 0940 Gross per 24 hour  Intake 270 ml  Output -  Net 270 ml     Wt Readings from Last 3 Encounters:  04/23/18 77.1 kg  10/03/17 76.6 kg  08/21/17 76.2 kg     Exam  General: Alert and oriented to self  Eyes:   HEENT:  Atraumatic, normocephalic  Cardiovascular: S1 S2 auscultated, Regular rate and rhythm.  Respiratory: Diminished breath sounds throughout  Gastrointestinal: Soft, nontender, nondistended, + bowel sounds  Ext: 1+ pedal edema bilaterally  Neuro: no new deficits  Musculoskeletal: No digital cyanosis, clubbing  Skin: No rashes  Psych: somewhat confused, oriented to self   Data Reviewed:  I have personally reviewed following labs and imaging studies  Micro Results No results found for this or any previous visit (from the past 240 hour(s)).  Radiology Reports Dg Chest 2 View  Result Date: 04/05/2018 CLINICAL DATA:  Weakness and increased shortness of breath 3 days. EXAM: CHEST - 2 VIEW COMPARISON:  08/04/2016 FINDINGS: Lungs are adequately inflated without focal airspace consolidation or effusion. There is subtle increased bilateral interstitial markings without change. Mild stable cardiomegaly. Remainder of the exam is unchanged. IMPRESSION: No acute cardiopulmonary disease. Chronic interstitial disease. Stable cardiomegaly. Electronically Signed   By: Elberta Fortis M.D.   On: 04/24/2018 18:14   US Renal  Result Date: 04/15/2018 CLINICAL DATA:  Acute kidney injury EXAM: RENAL / URINARY TRACT ULTRASOUND COMPLETE COMPARISON:  03/04/2009 abdominal CT FINDINGS: Right Kidney: Length: 10.2 cm. Echogenic cortex without cortical thinning. Four simple appearing cysts measuring up to 2.8 cm. No hydronephrosis or solid mass. Left Kidney: Length: 9.5 cm.  Echogenic cortex.   No hydronephrosis or mass. Bladder: Probable pelvic floor laxity.  No abnormal finding at the bladder. IMPRESSION: 1. No hydronephrosis or other acute finding. 2. Medical renal disease without atrophy. Electronically Signed   By: Marnee Spring M.D.   On: 04/15/2018 10:08   Nm Pulmonary Perf And Vent  Result Date: 04/14/2018 CLINICAL DATA:  Shortness of breath and cough. Evaluate for pulmonary embolism. EXAM: NUCLEAR MEDICINE VENTILATION - PERFUSION LUNG SCAN TECHNIQUE: Ventilation images were obtained in multiple projections using inhaled aerosol Tc-9m DTPA. Perfusion images were obtained in multiple projections after intravenous injection of Tc-79m-MAA. RADIOPHARMACEUTICALS:  31 mCi of Tc-55m DTPA aerosol inhalation and 4 mCi Tc72m-MAA IV COMPARISON:  Chest radiograph-04/29/2018; 08/04/2016; 04/21/2016 FINDINGS: Review of chest radiograph performed 04/09/2018 demonstrates grossly unchanged enlarged cardiac silhouette and mediastinal contours with atherosclerotic plaque when the thoracic aorta. Mild cephalization of flow without frank evidence  of edema. Mild diffuse slightly nodular thickening of the pulmonary interstitium. Minimal pleuroparenchymal thickening about the right minor fissure. No definite pleural effusion. No pneumothorax. Ventilation: There is minimal clumping of inhaled radiotracer about the bilateral pulmonary hila. Ingested radiotracer is seen within the mouth and hypopharynx. Perfusion: There is relative homogeneous distribution of injected radiotracer throughout the bilateral pulmonary parenchyma. No discrete mismatched filling defects to suggest pulmonary embolism. IMPRESSION: Pulmonary embolism absent (very low probability of pulmonary embolism). Electronically Signed   By: Simonne Come M.D.   On: 04/14/2018 17:50    Lab Data:  CBC: No results for input(s): WBC, NEUTROABS, HGB, HCT, MCV, PLT in the last 168 hours. Basic Metabolic Panel: No results for input(s): NA, K, CL, CO2,  GLUCOSE, BUN, CREATININE, CALCIUM, MG, PHOS in the last 168 hours. GFR: Estimated Creatinine Clearance: 11 mL/min (A) (by C-G formula based on SCr of 4.37 mg/dL (H)). Liver Function Tests: No results for input(s): AST, ALT, ALKPHOS, BILITOT, PROT, ALBUMIN in the last 168 hours. No results for input(s): LIPASE, AMYLASE in the last 168 hours. No results for input(s): AMMONIA in the last 168 hours. Coagulation Profile: No results for input(s): INR, PROTIME in the last 168 hours. Cardiac Enzymes: No results for input(s): CKTOTAL, CKMB, CKMBINDEX, TROPONINI in the last 168 hours. BNP (last 3 results) No results for input(s): PROBNP in the last 8760 hours. HbA1C: No results for input(s): HGBA1C in the last 72 hours. CBG: Recent Labs  Lab 04/19/18 0039 04/19/18 0804 04/22/18 0549 04/24/18 0921 04/22/2018 1214  GLUCAP 62* 88 67* 90 108*   Lipid Profile: No results for input(s): CHOL, HDL, LDLCALC, TRIG, CHOLHDL, LDLDIRECT in the last 72 hours. Thyroid Function Tests: No results for input(s): TSH, T4TOTAL, FREET4, T3FREE, THYROIDAB in the last 72 hours. Anemia Panel: No results for input(s): VITAMINB12, FOLATE, FERRITIN, TIBC, IRON, RETICCTPCT in the last 72 hours. Urine analysis:    Component Value Date/Time   COLORURINE YELLOW 04/27/2018 2200   APPEARANCEUR CLEAR 04/10/2018 2200   LABSPEC 1.022 04/09/2018 2200   PHURINE 6.0 04/20/2018 2200   GLUCOSEU NEGATIVE 04/17/2018 2200   HGBUR NEGATIVE 04/23/2018 2200   BILIRUBINUR NEGATIVE 04/22/2018 2200   KETONESUR 5 (A) 04/17/2018 2200   PROTEINUR >=300 (A) 04/03/2018 2200   UROBILINOGEN 1.0 06/06/2012 1203   NITRITE NEGATIVE 04/04/2018 2200   LEUKOCYTESUR NEGATIVE 04/17/2018 2200     Inna Tisdell M.D. [facility] 04/11/2018, 2:27 PM  Pager: [phone] Between 7am to 7pm - call Pager - [phone]  After 7pm go to [email or web address] - password [email or web address]  Call night coverage person covering after 7pm

## 2018-05-01 NOTE — Plan of Care (Signed)
  Problem: Clinical Measurements: Goal: Respiratory complications will improve Outcome: Progressing   Problem: Nutrition: Goal: Adequate nutrition will be maintained Outcome: Progressing   

## 2018-05-01 NOTE — Progress Notes (Signed)
Spoke with Craige Cotta, NP about niece [name] approving her number to be given to Chrissie Noa (son) so that they can figure out plans for the patient's funeral home.

## 2018-05-01 NOTE — Clinical Social Work Note (Signed)
CSW advised this morning that patient could probably be discharged today. Shari Heritage, Marcelline Mates [phone]) called and informed. Also talked with son regarding SNF recommendation and son stated calmly that if his mother wants to discharge home, this is what he wants. Son informed later today that his mom would not discharge today and he could not understand why since he was told this morning by this CSW that she would discharge. CSW attempted to explain and offered apologies. Received call from son at 5:02 asking if his mother was on same floor and in same room and was advised that she is.   CSW talked with niece Linward Natal outside of patient's room. She expressed concerns regarding patient's son being able to care for patient, but is hopeful that he will be there and adequately care for patient. When asked, Ms. Bowens still chooses to remove herself from the situation and let her son handle things.  Talked with APS SW Ozella Almond [phone]) regarding patient and change in today's discharge plan and conversations with son and niece. Ms. Kyla Balzarine indicated that she will request patient's medical records and the APS investigation will continue. CSW will continue to follow and facilitate discharge when released by MD.   Genelle Bal, MSW, LCSW Licensed Clinical Social Worker Clinical Social Work Department [employer] [phone]

## 2018-05-01 NOTE — Progress Notes (Signed)
RN paged because pt expired at 2300 hrs. RN went in room to give pt her meds and pt had a BM. RN left room for just a few minutes to retrieve supplies to clean up pt, and when RN returned, pt had expired. Pt was a DNR.  NP reviewed chart and noticed social work issues concerning the son. NP called the son and informed him of his mother's death. NP questions whether he really grasped what NP was telling him. NP informed him that RN would call him needing information about funeral home, etc. He mentioned cremation.  The only other alternative family member on chart was the pt's niece. Because of questions concerning son's grasp of situation, NP called and informed niece of pt's death. Niece stated to ask the pt's son first about his plans for funeral home, etc. She gave permission for our staff to give the son her number in case "he needs help".  Death certificate completed and given to RN, Verlee Monte.  KJKG, NP [facility]

## 2018-05-01 NOTE — Death Summary Note (Signed)
DEATH SUMMARY   Patient Details  Name: [name] MRN: [id number] DOB: [birth year]  Admission/Discharge Information   Admit Date:  April 18, 2018  Date of Death: Date of Death: 30-Apr-2018  Time of Death: Time of Death: November 20, 2298  Length of Stay: Nov 17, 2022  Referring Physician: Clayborn Heron, MD   Reason(s) for Hospitalization  Patient is a 83 year old female with hypertension, COPD on home O2, RA on Humira, psoriasis, GERD, presented with worsening shortness of breath for the last 3 to 4 days prior to admission, was found to have COPD exacerbation and acute worsening of chronic CKD stage III.  Patient was transferred to [facility], nephrology was consulted on 9/16 however patient declined dialysis.  Hospital stay further complicated by patient declining skilled nursing facility, psychiatry team deemed patient not having capacity.  Plan was for patient to be discharged home with hospice however patient requires 24-hour assistance.  Patient's son however has displayed paranoid behavior, questioning if he will be able to provide 24-hour care for his mother. Chest x-ray, VQ scan negative for PE.   Diagnoses  Preliminary cause of death: Acute respiratory failure (HCC)   Secondary Diagnoses (including complications and co-morbidities):    Essential hypertension   Rheumatoid arthritis (HCC)   Acute on chronic respiratory failure with hypoxia (HCC)   COPD exacerbation (HCC)   Hypokalemia   Elevated troponin   Acute renal failure superimposed on chronic kidney disease (HCC)   Prolonged Q-T interval on ECG   Brief Hospital Course (including significant findings, care, treatment, and services provided and events leading to death)   Acute on chronic CKD stage III, worsening -Patient presented with shortness of breath and volume overload.  Creatinine on admission 4.01, previously 1.6 on 08/04/2016 -Nephrology was consulted, patient however declined hemodialysis. Palliative care was consulted and plan  was to go home with hospice, comfort care.  Acute respiratory failure with hypoxia -Likely secondary to mild COPD exacerbation, interstitial lung disease, volume overload -Nephrology was consulted, patient declined hemodialysis -Palliative medicine was consulted, patient was comfortable.  Plan was to discharge patient to go home with hospice.  Social work and case management consult was obtained for disposition as her son had displayed paranoid behavior and work-up was in place for safe discharge for the patient especially in the light of hospice and declining kidney function.  Ethics consult was also obtained. -On 05-01-23, around 11 PM, patient had a bowel movement, RN and tech returned to the room to clean the patient and found her deceased.     Rheumatoid arthritis -Patient was continued on Arava  Hypertension Patient was continued on verapamil  Lower extremity edema -Likely due to #1, fluid overload -Venous Dopplers were negative for DVT  Failure to thrive, progressively worsening CKD, debility -Initial plan was to go home with hospice however no safe discharge plan was reached yet. -Ethics committee was consulted last week and APS was involved. -Palliative medicine was also consulted, comfort care   Pertinent Labs and Studies  Significant Diagnostic Studies Dg Chest 2 View  Result Date: 04-18-18 CLINICAL DATA:  Weakness and increased shortness of breath 3 days. EXAM: CHEST - 2 VIEW COMPARISON:  08/04/2016 FINDINGS: Lungs are adequately inflated without focal airspace consolidation or effusion. There is subtle increased bilateral interstitial markings without change. Mild stable cardiomegaly. Remainder of the exam is unchanged. IMPRESSION: No acute cardiopulmonary disease. Chronic interstitial disease. Stable cardiomegaly. Electronically Signed   By: Elberta Fortis M.D.   On: Apr 18, 2018 18:14   US Renal  Result Date: 04/15/2018 CLINICAL DATA:  Acute kidney injury EXAM: RENAL  / URINARY TRACT ULTRASOUND COMPLETE COMPARISON:  03/04/2009 abdominal CT FINDINGS: Right Kidney: Length: 10.2 cm. Echogenic cortex without cortical thinning. Four simple appearing cysts measuring up to 2.8 cm. No hydronephrosis or solid mass. Left Kidney: Length: 9.5 cm.  Echogenic cortex.  No hydronephrosis or mass. Bladder: Probable pelvic floor laxity.  No abnormal finding at the bladder. IMPRESSION: 1. No hydronephrosis or other acute finding. 2. Medical renal disease without atrophy. Electronically Signed   By: Marnee Spring M.D.   On: 04/15/2018 10:08   Nm Pulmonary Perf And Vent  Result Date: 04/14/2018 CLINICAL DATA:  Shortness of breath and cough. Evaluate for pulmonary embolism. EXAM: NUCLEAR MEDICINE VENTILATION - PERFUSION LUNG SCAN TECHNIQUE: Ventilation images were obtained in multiple projections using inhaled aerosol Tc-29m DTPA. Perfusion images were obtained in multiple projections after intravenous injection of Tc-48m-MAA. RADIOPHARMACEUTICALS:  31 mCi of Tc-67m DTPA aerosol inhalation and 4 mCi Tc104m-MAA IV COMPARISON:  Chest radiograph-04/19/2018; 08/04/2016; 04/21/2016 FINDINGS: Review of chest radiograph performed 04/30/2018 demonstrates grossly unchanged enlarged cardiac silhouette and mediastinal contours with atherosclerotic plaque when the thoracic aorta. Mild cephalization of flow without frank evidence of edema. Mild diffuse slightly nodular thickening of the pulmonary interstitium. Minimal pleuroparenchymal thickening about the right minor fissure. No definite pleural effusion. No pneumothorax. Ventilation: There is minimal clumping of inhaled radiotracer about the bilateral pulmonary hila. Ingested radiotracer is seen within the mouth and hypopharynx. Perfusion: There is relative homogeneous distribution of injected radiotracer throughout the bilateral pulmonary parenchyma. No discrete mismatched filling defects to suggest pulmonary embolism. IMPRESSION: Pulmonary embolism absent  (very low probability of pulmonary embolism). Electronically Signed   By: Simonne Come M.D.   On: 04/14/2018 17:50    Microbiology No results found for this or any previous visit (from the past 240 hour(s)).  Lab Basic Metabolic Panel: No results for input(s): NA, K, CL, CO2, GLUCOSE, BUN, CREATININE, CALCIUM, MG, PHOS in the last 168 hours. Liver Function Tests: No results for input(s): AST, ALT, ALKPHOS, BILITOT, PROT, ALBUMIN in the last 168 hours. No results for input(s): LIPASE, AMYLASE in the last 168 hours. No results for input(s): AMMONIA in the last 168 hours. CBC: No results for input(s): WBC, NEUTROABS, HGB, HCT, MCV, PLT in the last 168 hours. Cardiac Enzymes: No results for input(s): CKTOTAL, CKMB, CKMBINDEX, TROPONINI in the last 168 hours. Sepsis Labs: No results for input(s): PROCALCITON, WBC, LATICACIDVEN in the last 168 hours.  Procedures/Operations  None    Alexandru Moorer 04/14/2018, 7:20 AM

## 2018-05-01 DEATH — deceased
